# Patient Record
Sex: Female | Born: 1955
Health system: Southern US, Community
[De-identification: ages and names within clinical notes are randomized; demographics above are authoritative.]

## PROBLEM LIST (undated history)

## (undated) ENCOUNTER — Ambulatory Visit: Source: Home / Self Care

## (undated) DIAGNOSIS — R0600 Dyspnea, unspecified: Secondary | ICD-10-CM

## (undated) DIAGNOSIS — D649 Anemia, unspecified: Principal | ICD-10-CM

## (undated) DIAGNOSIS — K429 Umbilical hernia without obstruction or gangrene: Secondary | ICD-10-CM

## (undated) DIAGNOSIS — R7303 Prediabetes: Secondary | ICD-10-CM

## (undated) DIAGNOSIS — K219 Gastro-esophageal reflux disease without esophagitis: Secondary | ICD-10-CM

## (undated) DIAGNOSIS — K8020 Calculus of gallbladder without cholecystitis without obstruction: Secondary | ICD-10-CM

## (undated) DIAGNOSIS — I10 Essential (primary) hypertension: Secondary | ICD-10-CM

## (undated) DIAGNOSIS — B009 Herpesviral infection, unspecified: Secondary | ICD-10-CM

## (undated) DIAGNOSIS — M810 Age-related osteoporosis without current pathological fracture: Secondary | ICD-10-CM

## (undated) DIAGNOSIS — M1990 Unspecified osteoarthritis, unspecified site: Secondary | ICD-10-CM

## (undated) HISTORY — DX: Gastro-esophageal reflux disease without esophagitis: K21.9

## (undated) HISTORY — DX: Age-related osteoporosis without current pathological fracture: M81.0

## (undated) HISTORY — PX: APPENDECTOMY: SHX54

## (undated) HISTORY — DX: Calculus of gallbladder without cholecystitis without obstruction: K80.20

## (undated) HISTORY — DX: Unspecified osteoarthritis, unspecified site: M19.90

## (undated) HISTORY — PX: TUBAL LIGATION: SHX77

## (undated) HISTORY — PX: TONSILLECTOMY: SUR1361

## (undated) HISTORY — DX: Anemia, unspecified: D64.9

---

## 1999-02-01 ENCOUNTER — Encounter: Payer: Self-pay | Admitting: Emergency Medicine

## 1999-02-01 ENCOUNTER — Emergency Department (HOSPITAL_COMMUNITY): Admission: EM | Admit: 1999-02-01 | Discharge: 1999-02-01 | Payer: Self-pay | Admitting: Emergency Medicine

## 1999-05-28 ENCOUNTER — Emergency Department (HOSPITAL_COMMUNITY): Admission: EM | Admit: 1999-05-28 | Discharge: 1999-05-29 | Payer: Self-pay

## 1999-06-14 ENCOUNTER — Emergency Department (HOSPITAL_COMMUNITY): Admission: EM | Admit: 1999-06-14 | Discharge: 1999-06-14 | Payer: Self-pay

## 1999-07-08 ENCOUNTER — Emergency Department (HOSPITAL_COMMUNITY): Admission: EM | Admit: 1999-07-08 | Discharge: 1999-07-08 | Payer: Self-pay | Admitting: Emergency Medicine

## 2000-02-06 ENCOUNTER — Encounter: Payer: Self-pay | Admitting: Emergency Medicine

## 2000-02-06 ENCOUNTER — Emergency Department (HOSPITAL_COMMUNITY): Admission: EM | Admit: 2000-02-06 | Discharge: 2000-02-06 | Payer: Self-pay | Admitting: Emergency Medicine

## 2000-03-30 ENCOUNTER — Emergency Department (HOSPITAL_COMMUNITY): Admission: EM | Admit: 2000-03-30 | Discharge: 2000-03-30 | Payer: Self-pay | Admitting: Emergency Medicine

## 2000-06-06 ENCOUNTER — Emergency Department (HOSPITAL_COMMUNITY): Admission: EM | Admit: 2000-06-06 | Discharge: 2000-06-07 | Payer: Self-pay | Admitting: Emergency Medicine

## 2000-06-19 ENCOUNTER — Emergency Department (HOSPITAL_COMMUNITY): Admission: EM | Admit: 2000-06-19 | Discharge: 2000-06-19 | Payer: Self-pay | Admitting: Internal Medicine

## 2000-06-22 ENCOUNTER — Emergency Department (HOSPITAL_COMMUNITY): Admission: EM | Admit: 2000-06-22 | Discharge: 2000-06-23 | Payer: Self-pay | Admitting: *Deleted

## 2000-07-03 ENCOUNTER — Emergency Department (HOSPITAL_COMMUNITY): Admission: EM | Admit: 2000-07-03 | Discharge: 2000-07-03 | Payer: Self-pay | Admitting: Emergency Medicine

## 2000-07-28 ENCOUNTER — Encounter: Payer: Self-pay | Admitting: Emergency Medicine

## 2000-07-28 ENCOUNTER — Emergency Department (HOSPITAL_COMMUNITY): Admission: EM | Admit: 2000-07-28 | Discharge: 2000-07-28 | Payer: Self-pay | Admitting: Emergency Medicine

## 2000-09-01 ENCOUNTER — Emergency Department (HOSPITAL_COMMUNITY): Admission: EM | Admit: 2000-09-01 | Discharge: 2000-09-02 | Payer: Self-pay | Admitting: Emergency Medicine

## 2000-09-15 ENCOUNTER — Emergency Department (HOSPITAL_COMMUNITY): Admission: EM | Admit: 2000-09-15 | Discharge: 2000-09-15 | Payer: Self-pay | Admitting: *Deleted

## 2000-09-22 ENCOUNTER — Emergency Department (HOSPITAL_COMMUNITY): Admission: EM | Admit: 2000-09-22 | Discharge: 2000-09-22 | Payer: Self-pay | Admitting: Emergency Medicine

## 2000-10-25 ENCOUNTER — Emergency Department (HOSPITAL_COMMUNITY): Admission: EM | Admit: 2000-10-25 | Discharge: 2000-10-25 | Payer: Self-pay | Admitting: Emergency Medicine

## 2000-11-25 ENCOUNTER — Emergency Department (HOSPITAL_COMMUNITY): Admission: EM | Admit: 2000-11-25 | Discharge: 2000-11-26 | Payer: Self-pay | Admitting: Emergency Medicine

## 2000-11-26 ENCOUNTER — Encounter: Payer: Self-pay | Admitting: General Surgery

## 2000-12-18 ENCOUNTER — Encounter: Payer: Self-pay | Admitting: Emergency Medicine

## 2000-12-18 ENCOUNTER — Emergency Department (HOSPITAL_COMMUNITY): Admission: EM | Admit: 2000-12-18 | Discharge: 2000-12-18 | Payer: Self-pay | Admitting: Emergency Medicine

## 2000-12-23 ENCOUNTER — Inpatient Hospital Stay (HOSPITAL_COMMUNITY): Admission: AD | Admit: 2000-12-23 | Discharge: 2000-12-23 | Payer: Self-pay | Admitting: Obstetrics

## 2001-05-12 ENCOUNTER — Emergency Department (HOSPITAL_COMMUNITY): Admission: EM | Admit: 2001-05-12 | Discharge: 2001-05-12 | Payer: Self-pay | Admitting: *Deleted

## 2002-07-05 ENCOUNTER — Emergency Department (HOSPITAL_COMMUNITY): Admission: EM | Admit: 2002-07-05 | Discharge: 2002-07-05 | Payer: Self-pay | Admitting: Emergency Medicine

## 2002-07-05 ENCOUNTER — Encounter: Payer: Self-pay | Admitting: Emergency Medicine

## 2002-08-04 ENCOUNTER — Emergency Department (HOSPITAL_COMMUNITY): Admission: EM | Admit: 2002-08-04 | Discharge: 2002-08-04 | Payer: Self-pay | Admitting: Emergency Medicine

## 2003-02-25 ENCOUNTER — Emergency Department (HOSPITAL_COMMUNITY): Admission: EM | Admit: 2003-02-25 | Discharge: 2003-02-25 | Payer: Self-pay | Admitting: Emergency Medicine

## 2003-08-02 ENCOUNTER — Emergency Department (HOSPITAL_COMMUNITY): Admission: EM | Admit: 2003-08-02 | Discharge: 2003-08-02 | Payer: Self-pay | Admitting: Emergency Medicine

## 2003-11-04 ENCOUNTER — Emergency Department (HOSPITAL_COMMUNITY): Admission: EM | Admit: 2003-11-04 | Discharge: 2003-11-04 | Payer: Self-pay | Admitting: Emergency Medicine

## 2004-01-18 ENCOUNTER — Ambulatory Visit: Payer: Self-pay | Admitting: Family Medicine

## 2004-07-08 ENCOUNTER — Emergency Department (HOSPITAL_COMMUNITY): Admission: EM | Admit: 2004-07-08 | Discharge: 2004-07-08 | Payer: Self-pay | Admitting: Emergency Medicine

## 2004-07-09 ENCOUNTER — Inpatient Hospital Stay (HOSPITAL_COMMUNITY): Admission: AD | Admit: 2004-07-09 | Discharge: 2004-07-09 | Payer: Self-pay | Admitting: *Deleted

## 2004-09-21 ENCOUNTER — Emergency Department (HOSPITAL_COMMUNITY): Admission: EM | Admit: 2004-09-21 | Discharge: 2004-09-21 | Payer: Self-pay | Admitting: Emergency Medicine

## 2004-09-23 ENCOUNTER — Emergency Department (HOSPITAL_COMMUNITY): Admission: EM | Admit: 2004-09-23 | Discharge: 2004-09-23 | Payer: Self-pay | Admitting: Emergency Medicine

## 2004-09-25 ENCOUNTER — Emergency Department (HOSPITAL_COMMUNITY): Admission: EM | Admit: 2004-09-25 | Discharge: 2004-09-25 | Payer: Self-pay | Admitting: Emergency Medicine

## 2004-10-03 ENCOUNTER — Emergency Department (HOSPITAL_COMMUNITY): Admission: EM | Admit: 2004-10-03 | Discharge: 2004-10-03 | Payer: Self-pay | Admitting: Emergency Medicine

## 2004-11-11 ENCOUNTER — Emergency Department (HOSPITAL_COMMUNITY): Admission: EM | Admit: 2004-11-11 | Discharge: 2004-11-11 | Payer: Self-pay | Admitting: Emergency Medicine

## 2005-10-26 ENCOUNTER — Ambulatory Visit: Payer: Self-pay | Admitting: Cardiology

## 2005-10-26 ENCOUNTER — Inpatient Hospital Stay (HOSPITAL_COMMUNITY): Admission: EM | Admit: 2005-10-26 | Discharge: 2005-10-30 | Payer: Self-pay | Admitting: Family Medicine

## 2005-11-01 ENCOUNTER — Emergency Department (HOSPITAL_COMMUNITY): Admission: EM | Admit: 2005-11-01 | Discharge: 2005-11-01 | Payer: Self-pay | Admitting: Emergency Medicine

## 2005-11-02 ENCOUNTER — Inpatient Hospital Stay (HOSPITAL_COMMUNITY): Admission: EM | Admit: 2005-11-02 | Discharge: 2005-11-03 | Payer: Self-pay | Admitting: Emergency Medicine

## 2005-11-11 ENCOUNTER — Ambulatory Visit: Payer: Self-pay | Admitting: Internal Medicine

## 2005-11-17 ENCOUNTER — Emergency Department (HOSPITAL_COMMUNITY): Admission: EM | Admit: 2005-11-17 | Discharge: 2005-11-17 | Payer: Self-pay | Admitting: Emergency Medicine

## 2005-11-18 ENCOUNTER — Encounter: Payer: Self-pay | Admitting: Vascular Surgery

## 2005-11-18 ENCOUNTER — Ambulatory Visit (HOSPITAL_COMMUNITY): Admission: RE | Admit: 2005-11-18 | Discharge: 2005-11-18 | Payer: Self-pay | Admitting: Emergency Medicine

## 2005-11-22 ENCOUNTER — Emergency Department (HOSPITAL_COMMUNITY): Admission: EM | Admit: 2005-11-22 | Discharge: 2005-11-23 | Payer: Self-pay | Admitting: Emergency Medicine

## 2005-11-25 ENCOUNTER — Ambulatory Visit: Payer: Self-pay | Admitting: Cardiology

## 2006-04-10 ENCOUNTER — Emergency Department (HOSPITAL_COMMUNITY): Admission: EM | Admit: 2006-04-10 | Discharge: 2006-04-10 | Payer: Self-pay | Admitting: Family Medicine

## 2006-06-11 DIAGNOSIS — J45909 Unspecified asthma, uncomplicated: Secondary | ICD-10-CM | POA: Insufficient documentation

## 2006-06-11 DIAGNOSIS — M1990 Unspecified osteoarthritis, unspecified site: Secondary | ICD-10-CM | POA: Insufficient documentation

## 2006-06-11 DIAGNOSIS — Z87891 Personal history of nicotine dependence: Secondary | ICD-10-CM | POA: Insufficient documentation

## 2006-06-11 DIAGNOSIS — I10 Essential (primary) hypertension: Secondary | ICD-10-CM | POA: Insufficient documentation

## 2006-07-20 ENCOUNTER — Emergency Department (HOSPITAL_COMMUNITY): Admission: EM | Admit: 2006-07-20 | Discharge: 2006-07-20 | Payer: Self-pay | Admitting: Family Medicine

## 2006-08-21 ENCOUNTER — Emergency Department (HOSPITAL_COMMUNITY): Admission: EM | Admit: 2006-08-21 | Discharge: 2006-08-21 | Payer: Self-pay | Admitting: Emergency Medicine

## 2008-01-28 ENCOUNTER — Emergency Department (HOSPITAL_COMMUNITY): Admission: EM | Admit: 2008-01-28 | Discharge: 2008-01-28 | Payer: Self-pay | Admitting: Emergency Medicine

## 2008-03-30 ENCOUNTER — Emergency Department (HOSPITAL_COMMUNITY): Admission: EM | Admit: 2008-03-30 | Discharge: 2008-03-30 | Payer: Self-pay | Admitting: Family Medicine

## 2008-06-06 ENCOUNTER — Emergency Department (HOSPITAL_COMMUNITY): Admission: EM | Admit: 2008-06-06 | Discharge: 2008-06-06 | Payer: Self-pay | Admitting: Emergency Medicine

## 2008-07-31 ENCOUNTER — Ambulatory Visit (HOSPITAL_COMMUNITY): Admission: RE | Admit: 2008-07-31 | Discharge: 2008-07-31 | Payer: Self-pay | Admitting: Emergency Medicine

## 2008-12-09 ENCOUNTER — Emergency Department (HOSPITAL_COMMUNITY): Admission: EM | Admit: 2008-12-09 | Discharge: 2008-12-09 | Payer: Self-pay | Admitting: Family Medicine

## 2009-02-06 ENCOUNTER — Emergency Department (HOSPITAL_COMMUNITY): Admission: EM | Admit: 2009-02-06 | Discharge: 2009-02-06 | Payer: Self-pay | Admitting: Emergency Medicine

## 2009-03-07 ENCOUNTER — Emergency Department (HOSPITAL_COMMUNITY): Admission: EM | Admit: 2009-03-07 | Discharge: 2009-03-07 | Payer: Self-pay | Admitting: Emergency Medicine

## 2009-07-30 ENCOUNTER — Ambulatory Visit (HOSPITAL_COMMUNITY): Admission: RE | Admit: 2009-07-30 | Discharge: 2009-07-30 | Payer: Self-pay | Admitting: Obstetrics and Gynecology

## 2010-07-17 LAB — DIFFERENTIAL
Basophils Absolute: 0 10*3/uL (ref 0.0–0.1)
Basophils Relative: 0 % (ref 0–1)
Eosinophils Absolute: 0.1 10*3/uL (ref 0.0–0.7)
Eosinophils Relative: 1 % (ref 0–5)
Lymphocytes Relative: 20 % (ref 12–46)
Lymphs Abs: 1.9 10*3/uL (ref 0.7–4.0)
Monocytes Absolute: 0.7 K/uL (ref 0.1–1.0)
Monocytes Relative: 7 % (ref 3–12)
Neutro Abs: 6.8 10*3/uL (ref 1.7–7.7)
Neutrophils Relative %: 72 % (ref 43–77)

## 2010-07-17 LAB — COMPREHENSIVE METABOLIC PANEL WITH GFR
AST: 16 U/L (ref 0–37)
Albumin: 3.1 g/dL — ABNORMAL LOW (ref 3.5–5.2)
BUN: 13 mg/dL (ref 6–23)
CO2: 30 meq/L (ref 19–32)
Calcium: 8.6 mg/dL (ref 8.4–10.5)
Creatinine, Ser: 0.85 mg/dL (ref 0.4–1.2)
GFR calc Af Amer: >60 mL/min (ref >60)
GFR calc non Af Amer: >60 mL/min (ref >60)
Potassium: 3.1 meq/L — ABNORMAL LOW (ref 3.5–5.1)
Total Protein: 7.3 g/dL (ref 6.0–8.3)

## 2010-07-17 LAB — POCT CARDIAC MARKERS
Myoglobin, poc: 62.9 ng/mL (ref 12–200)
Troponin i, poc: <0.05 ng/mL (ref 0.00–0.09)

## 2010-07-17 LAB — URINE MICROSCOPIC-ADD ON

## 2010-07-17 LAB — COMPREHENSIVE METABOLIC PANEL
ALT: 13 U/L (ref 0–35)
Alkaline Phosphatase: 78 U/L (ref 39–117)
Chloride: 101 mEq/L (ref 96–112)
Glucose, Bld: 114 mg/dL — ABNORMAL HIGH (ref 70–99)
Sodium: 137 mEq/L (ref 135–145)
Total Bilirubin: 0.2 mg/dL — ABNORMAL LOW (ref 0.3–1.2)

## 2010-07-17 LAB — URINALYSIS, ROUTINE W REFLEX MICROSCOPIC
Bilirubin Urine: NEGATIVE
Glucose, UA: NEGATIVE mg/dL
Hgb urine dipstick: NEGATIVE
Ketones, ur: NEGATIVE mg/dL
Nitrite: NEGATIVE
Protein, ur: NEGATIVE mg/dL
Specific Gravity, Urine: 1.023 (ref 1.005–1.030)
Urobilinogen, UA: 0.2 mg/dL (ref 0.0–1.0)
pH: 6 (ref 5.0–8.0)

## 2010-07-17 LAB — CBC
HCT: 30.6 % — ABNORMAL LOW (ref 36.0–46.0)
Hemoglobin: 9.3 g/dL — ABNORMAL LOW (ref 12.0–15.0)
MCHC: 30.2 g/dL (ref 30.0–36.0)
MCV: 63.1 fL — ABNORMAL LOW (ref 78.0–100.0)
Platelets: 325 K/uL (ref 150–400)
RBC: 4.86 MIL/uL (ref 3.87–5.11)
RDW: 22.2 % — ABNORMAL HIGH (ref 11.5–15.5)
WBC: 9.4 K/uL (ref 4.0–10.5)

## 2010-08-15 ENCOUNTER — Other Ambulatory Visit (HOSPITAL_COMMUNITY): Payer: Self-pay | Admitting: Internal Medicine

## 2010-08-15 DIAGNOSIS — Z1231 Encounter for screening mammogram for malignant neoplasm of breast: Secondary | ICD-10-CM

## 2010-08-22 ENCOUNTER — Ambulatory Visit (HOSPITAL_COMMUNITY)
Admission: RE | Admit: 2010-08-22 | Discharge: 2010-08-22 | Disposition: A | Discharge status: Home / Self Care | Payer: Self-pay | Source: Ambulatory Visit | Attending: Internal Medicine | Admitting: Internal Medicine

## 2010-08-22 DIAGNOSIS — Z1231 Encounter for screening mammogram for malignant neoplasm of breast: Secondary | ICD-10-CM

## 2010-08-30 NOTE — H&P (Signed)
NAME:  Linda Cabrera, Linda Cabrera NO.:  [id number]   MEDICAL RECORD NO.:  [id number]          PATIENT TYPE:  EMS   LOCATION:  MAJO                         FACILITY:  [facility]   PHYSICIAN:  Lonia Blood, M.D.DATE OF BIRTH:  10/06/1955   DATE OF ADMISSION:  10/25/2005  DATE OF DISCHARGE:                                HISTORY & PHYSICAL   PRIMARY CARE PHYSICIAN:  Unassigned.   CHIEF COMPLAINT:  Chest pain.   HISTORY OF PRESENT ILLNESS:  [name] is a 55 year old female who has  no primary care physician.  She has a history of hypertension and was  treated for medication through [facility] months ago.  She then ran  out of her medications and has not followed up with [facility] since that  time.  Approximately two days ago she began to experience a generalized  severe headache.  She took over-the-counter pain remedies, and they did not  help.  She went to [employer] and used their blood pressure monitor in the  pharmacy and found a systolic pressure greater than 200.  She went to bed.  Upon awakening one day ago her headache continued.  Then she began to  experience chest pain.  She describes this pain specifically as a pressure  in the left substernal region and the left shoulder.  This pressure is  intermittent.  It is worse when she attempts to ambulate.  It does go away  for hours at a time and when it recurs it tends to be when she is  ambulating.  She has not experienced this chest pressure before.  She  specifically describes it as a pressure-type sensation and a heaviness on  my chest.  Nothing has significantly relieved this pain until she [facility]  [facility] where she was given nitroglycerin.  After a total  of three doses she reports that her pain resolved completely.  She was on a  nitroglycerin drip at the time of my evaluation.  As I was examining her I  discontinued her bedside nitro drip.  Within approximately 10 minutes she  was experiencing chest-type pressure again.  Nitroglycerin drip was  therefore resumed.  The patient reports that she otherwise is without acute  complaint.   REVIEW OF SYSTEMS:  The patient is a smoker.  She reports that she has  asthma and intermittent trouble breathing with wheezing which she treats  with an albuterol inhaler.  Her appetite has been preserved.  She does not  know her lipid status.  A comprehensive review of systems has otherwise been  unremarkable.   PAST MEDICAL HISTORY:  1.  Hypertension.  2.  Asthma (bronchitis).  3.  Tobacco abuse in the amount of one half pack per day since teen years.  4.  Morbid obesity.   MEDICATIONS:  None at present with the exception of occasional albuterol MDI  p.r.n.   ALLERGIES:  No known drug allergies.   FAMILY HISTORY:  The patient's father is deceased of unknown causes.  The  patient's mother is deceased from pancreatic cancer, but the  patient reports  that she had coronary artery disease in her 5s.  The patient has one  brother who has coronary artery disease with onset in his 11s.   SOCIAL HISTORY:  The patient lives in [place].  She does not drink.  She  does not use illicit drugs.  She is currently unemployed.   DATA REVIEW:  Hemoglobin is 8.9 with an MCV of 63.  White count and platelet  count are essentially normal.  Sodium, potassium, chloride, bicarb, BUN and  creatinine are normal.  Serum glucose is normal.  The pH is 7.35 with a PCO2  of 48 and no PO2 on venous gas.  BNP is normal.  D-dimer is remarkably  normal at 0.31.  Point-of-care cardiac markers are negative x3.   A 12-lead EKG reveals normal sinus rhythm at 69 beats/min.   PHYSICAL EXAMINATION:  VITAL SIGNS:  Temperature 97.3, blood pressure  162/88, heart rate 93, respiratory rate 16.  O2 sat is 99% on room air.  GENERAL:  A morbidly obese female in no acute respiratory distress.  HEENT:  Normocephalic, atraumatic.  Pupils are equal, round and  reactive to  light and accommodation.  Extraocular muscles intact bilaterally.  OC/OP  clear.  NECK:  Morbidly obese.  No thyromegaly.  LUNGS:  Clear to auscultation bilaterally without wheezes or rhonchi.  CARDIOVASCULAR:  Regular rate and rhythm though distant without murmur,  gallop or rub.  ABDOMEN:  Morbidly obese.  Soft.  Bowel sounds present.  No  hepatosplenomegaly.  No rebound.  No ascites.  EXTREMITIES:  Trace bilateral lower extremity edema without cyanosis,  clubbing or edema.  NEUROLOGIC:  Alert and oriented x4.  Cranial nerves II-XII intact  bilaterally.  Strength 5/5 in bilateral upper and lower extremities.  No  Babinski.   IMPRESSION AND PLAN:  1.  Chest pain.  The patient does in fact describe chest pressure.  Her      description is concerning for unstable angina.  She has an unknown lipid      status.  She is hypertensive and is morbidly obese.  She has a strong      family history.  She is not, however, diabetic.  Her risk factors are      significantly elevated, and her symptoms are significantly consistent      with angina that I feel that inpatient evaluation with rule-out and risk      stratification is clearly indicated.  The patient will be continued on      nitroglycerin drip.  Lovenox will be administered.  Beta-blocker will be      initiated.  After the patient has ruled out for MI a cardiology      consultation will be considered for inpatient risk stratification.  2.  Uncontrolled hypertension.  The patient has severely uncontrolled      hypertension.  For now we will use beta-blocker for management.  If      metoprolol alone is not sufficient we will consider adding HCTZ.  We      will further consider ACE inhibitor as needed.  3.  Tobacco abuse.  The patient has been counseled on the multiple      deleterious effects of ongoing tobacco abuse.  She has been counseled to     discontinue tobacco abuse immediately.  Tobacco cessation consultation       will be requested to encourage this during the patient's hospital stay.  4.  Normocytic anemia.  The patient has  a significant normocytic anemia with      a hemoglobin of 8.9 and an MCV of 63.  This is likely simply secondary      to menstruation.  We will check an anemia panel and guaiac stools as      well.  5.  Patient with morbid obesity.  The patient will be counseled during this      hospital stay as to the advisability of significant weight loss.  She      will be counseled on appropriate plans to do so.      Lonia Blood, M.D.  Electronically Signed     JTM/MEDQ  D:  10/26/2005  T:  10/26/2005  Job:  [id number]

## 2010-08-30 NOTE — Discharge Summary (Signed)
NAME:  ZALEY, TALLEY                 ACCOUNT NO.:  [id number]   MEDICAL RECORD NO.:  [id number]          PATIENT TYPE:  INP   LOCATION:  3713                         FACILITY:  [facility]   PHYSICIAN:  Lonia Blood, M.D.      DATE OF BIRTH:  December 11, 1955   DATE OF ADMISSION:  10/26/2005  DATE OF DISCHARGE:  10/30/2005                                 DISCHARGE SUMMARY   PRIMARY CARE PHYSICIAN:  Lexicographer, unassigned.   DISCHARGE DIAGNOSES:  1.  Atypical chest pain with MI ruled out.  2.  Uncontrolled hypertension.  3.  Tobacco abuse.  4.  Iron-deficiency anemia.  5.  Morbid obesity.  6.  Asthma bronchitis.   DISCHARGE MEDICATIONS:  1.  Clonidine 0.1 mg p.o. t.i.d.  2.  Nu-Iron 150 mg daily.  3.  Hydrochlorothiazide 25 mg daily.  4.  Lopressor 75 mg b.i.d.   DISPOSITION:  The patient is completely chest pain-free.  She is being  discharged to follow up with [facility].  Her workup is essentially negative  for ischemia with Cardiolite at least in the preliminary stage.  She seems  to have had more than likely hypertensive urgency situation which has now  resolved.  She is currently going to continue taking three different  medications for her high blood pressure.   PROCEDURES PERFORMED:  1.  Chest x-ray performed on October 25, 2005 showed no acute cardiopulmonary      process.  2.  Cardiolite performed on October 28, 2005.  Preliminary results negative for      ischemia.   CONSULTATIONS:  Dr. Rollene Rotunda, [facility].   HISTORY AND PHYSICAL:  Please refer to dictated history and physical by Dr.  Jetty Duhamel.  In short, however, the patient is a 55 year old female who  apparently has a history of hypertension and asthma.  She came in to the ER  secondary to having left-sided chest pain that has been going on for about  two days and not able to control it by over-the-counter medications.  In the  ED her blood pressure was found to be 162/88.  The patient said her  systolic  has been in the 200s in the preceding days.  Her heart rate was 93.  She was  otherwise stable.  She has risk factors for coronary artery disease  including hypertension, morbid obesity, and tobacco abuse.  Hence, she was  admitted for further workup.  Her EKG was only sinus rhythm at that time  with no ST-T wave changes.   HOSPITAL COURSE:  1.  Chest pain.  This was thought to be atypical but due to her risk      factors, cardiac enzymes were checked serially and were negative.  She      was started on Nitro drip both for her blood pressure and the chest pain      which seems to have relieved her chest pain.  She was also on beta      blockers and morphine.  The patient responded to this treatment and got      better.  Due to a risk factor also, cardiology consult was requested and      patient had a Cardiolite done that preliminary results show that it is      negative for ischemia.  She is to continue current medications to      control her blood pressure and follow up as an outpatient with her      primary care physician at [facility].  2.  Uncontrolled hypertension.  The patient's hypertension was severe and it      took multiple medications to get it under control.  Currently systolic      is less than 120 and the patient has been advised to continue checking      that.  Also weight loss as well as diet modification.  3.  Tobacco abuse.  The patient received tobacco cessation counseling.  In      fact, nicotine patch was also offered while in the hospital but the      patient did not require that.  She is contemplating quitting but has not      quit.  4.  Iron-deficiency anemia.  The patient's hemoglobin on admission was 8.9.      Workup included checking anemia panel that came back with total iron of      only 13, iron sats of 4, ferritin only 2, indicating that the patient      has clearly iron-deficiency anemia.  At the age of 65, she will need      some GI workup.   Her primary care physician is advised to schedule the      patient for a colonoscopy.  She had no obvious bleeding and no occult      bleeding.  5.  Asthma/bronchitis.  The patient had a bout of asthma attack while      undergoing Cardiolite.  Otherwise she has been stable with no attacks.   LABORATORY DATA:  Other significant labs include her fasting lipid panel  that showed total cholesterol of 132, triglycerides 36, HDL 52, and LDL 73.      Lonia Blood, M.D.  Electronically Signed     LG/MEDQ  D:  10/30/2005  T:  10/30/2005  Job:  [id number]

## 2010-08-30 NOTE — Cardiovascular Report (Signed)
NAME:  Linda Cabrera, Linda Cabrera                 ACCOUNT NO.:  [id number]   MEDICAL RECORD NO.:  [id number]          PATIENT TYPE:  INP   LOCATION:  3713                         FACILITY:  [facility]   PHYSICIAN:  Salvadore Farber, M.D. LHCDATE OF BIRTH:  1955/06/24   DATE OF PROCEDURE:  10/30/2005  DATE OF DISCHARGE:                              CARDIAC CATHETERIZATION   DATE OF PROCEDURE:  10/30/2005.   PROCEDURE:  Left heart catheterization, left ventriculography, coronary  angiography.   INDICATIONS:  Ms. Preslar is a 55 year old lady with morbid obesity and  hypertension as well as ongoing tobacco abuse.  She presents with an episode  of fairly atypical chest discomfort in the setting of severely elevated  blood pressure.  Her morbid obesity would decrease the sensitivity and  specificity of any noninvasive test.  She was therefore referred for  diagnostic angiography for definitive exclusion of coronary disease as the  etiology of her chest discomfort.   PROCEDURAL TECHNIQUE:  Informed consent was obtained.  Under 1% lidocaine  and local anesthesia, a 5-French sheath was placed in the right common  femoral artery using the modified Seldinger technique.  Diagnostic  angiography and ventriculography were performed using JL-4, JR-4, and  pigtail catheters.  The arteriotomy was then closed using an Angio-Seal  device.  Complete hemostasis was obtained.  She was then transferred to the  holding room in stable condition having tolerated the procedure well.   COMPLICATIONS:  None.   FINDINGS:  1. LV:  158/24/39.  EF of 55% with mild mid anterior hypokinesis.  It is      not clear that this is outside the limits of normal.  2. No aortic stenosis or mitral regurgitation.  3. Left main:  Angiographically normal.  4. LAD:  Moderate-sized vessel giving rise to no sizable diagonals.  It is      angiographically normal.  5. Ramus intermedius:  Very large bifurcating vessel.  It is      angiographically  normal.  6. Circumflex:  Relatively small vessel giving rise to two marginals.  It      is angiographically normal.  7. RCA:  Moderate-sized dominant vessel.  It is angiographically normal.   IMPRESSION/PLAN:  The patient has angiographically normal coronary arteries.  Her chest pain may be due to her systemic hypertension as well as elevated  left ventricular end-diastolic pressure.  We will work aggressively on blood  pressure control.  Smoking cessation was also strongly advised.  Will need  to consider sleep apnea as a contributor to her morbidity as well.     Salvadore Farber, M.D. [facility]  Electronically Signed    WED/MEDQ  D:  10/30/2005  T:  10/30/2005  Job:  [id number]

## 2010-08-30 NOTE — Discharge Summary (Signed)
NAME:  Linda Cabrera, BITTEL                 ACCOUNT NO.:  [id number]   MEDICAL RECORD NO.:  [id number]          PATIENT TYPE:  INP   LOCATION:  5705                         FACILITY:  [facility]   PHYSICIAN:  Hillery Aldo, M.D.   DATE OF BIRTH:  1955-12-11   DATE OF ADMISSION:  11/02/2005  DATE OF DISCHARGE:  11/03/2005                                 DISCHARGE SUMMARY   PRIMARY CARE PHYSICIAN:  [facility]   CHIEF COMPLAINT:  Angioedema.   DISCHARGE DIAGNOSES:  1.  Angioedema related to ACE inhibitor use.  2.  Hypertension.  3.  Iron deficiency anemia.  4.  Asthma.  5.  Ongoing tobacco abuse.  6.  Obesity.  7.  Gastroesophageal reflux disease.  8.  Recent hospitalization for atypical chest pain with a negative      Cardiolite and cardiac catheterization.   DISCHARGE MEDICATIONS:  1.  Norvasc 5 mg daily.  2.  Hydrochlorothiazide 25 mg daily.  3.  Clonidine 0.1 mg t.i.d.  4.  Nu-Iron 150 mg daily.  5.  Prednisone taper 60 mg daily to off over six days.  6.  Albuterol two puffs q.4h. p.r.n.  7.  Benadryl 25 mg q.6h. p.r.n.   CONSULTATIONS:  None.   BRIEF ADMISSION HPI:  Patient is a 55 year old female who presented with  sudden onset of angioedema 24 hours after starting lisinopril.  She was  admitted for further evaluation and monitoring of her airway.   PROCEDURES AND DIAGNOSTIC STUDIES:  Chest x-ray on November 02, 2005 showed no  active cardiopulmonary disease.   DISCHARGE LABORATORY VALUES:  Sodium was 139, potassium 3.7, chloride 103,  bicarbonate 28, BUN 9, creatinine 0.8, glucose 129.  White blood cell count  was 9.6, hemoglobin 9.6, hematocrit 31.8, platelet count 325 with an MCV of  64.1.   HOSPITAL COURSE:  #1 - ANGIOEDEMA:  Patient was put on Solu-Medrol at a dose  of 40 mg IV daily and received two doses of this.  Within 24 hours there was  a marked reduction in the swelling about her face.  She was discharged on a  prednisone taper over six days.  She was also put on  Benadryl 25 mg q.i.d.  and will be discharged on this on an as-needed basis.  She was instructed to  avoid any medications ending in pril.  She is deemed stable for discharge  and was instructed to return to the ED for any further problems with  swelling   #2 - HYPERTENSION:  Patient was hypertensive with systolic pressures in 160s  once the lisinopril was discontinued.  She was started on Norvasc prior to  discharge and will continue on this medication until she follows up with her  primary care physician at [facility].   #3 - STEROID-INDUCED HYPERGLYCEMIA:  The patient did have elevated blood  glucoses while on Solu-Medrol.  The highest was 249.  She will not be  discharged on any oral hypoglycemic medications given that she will undergo  a rapid steroid taper.  She should be routinely checked for evidence of  impaired fasting glucose for  diabetes as this likely indicates an underlying  predisposition.  She was managed with sliding scale insulin while  hospitalized.   #4 - ASTHMA:  The patient's asthma remained stable through the course of her  hospitalization.   #5 - IRON DEFICIENCY ANEMIA:  The patient was continued on iron supplements.   DISPOSITION:  The patient is stable for discharge.  She instructed to follow  up with her primary care physician at [facility] next week.      Hillery Aldo, M.D.  Electronically Signed     CR/MEDQ  D:  11/03/2005  T:  11/03/2005  Job:  [id number]

## 2010-08-30 NOTE — Consult Note (Signed)
NAME:  [name], [name]                           ACCOUNT NO.:  [id number]   MEDICAL RECORD NO.:  [id number]                   PATIENT TYPE:  EMS   LOCATION:  ED                                   FACILITY:  [facility]   PHYSICIAN:  Corinna L. Lendell Caprice, MD             DATE OF BIRTH:  04-22-1955   DATE OF CONSULTATION:  02/25/2003  DATE OF DISCHARGE:                                   CONSULTATION   REASON FOR CONSULTATION:  Asthma exacerbation, evaluate for admission.   CONSULTING PHYSICIAN:  Dr. Ethelda Chick.   IMPRESSIONS/RECOMMENDATIONS:  1. Acute bronchitis with exacerbation of asthma.  Patient currently has good     oxygen saturations even with ambulation and is not truly wheezing     currently.  She does have voluntary upper respiratory sounds, which stop     when she is talking or distracted.  I feel patient is safe to be     discharged to home with outpatient treatment and follow up with     [facility].  I have also recommended that she quit smoking.  2. Tobacco abuse, see above.  3. Obesity.   HISTORY OF PRESENT ILLNESS:  [name] is a 55 year old black female with a  history of asthma who ran out of her inhaler and presents with a few-day  history of worsening shortness of breath.  She has had a cough productive of  clear to yellow sputum.  She denies fevers or chills.  She has been short of  breath.  She continues to smoke a half a pack of cigarettes a day.   PAST MEDICAL HISTORY:  As above.   MEDICATIONS:  None.   ALLERGIES:  None.   SOCIAL HISTORY:  She is unemployed.  She smokes a half a pack of cigarettes  a day.  She denies drinking or drugs.   FAMILY HISTORY:  Noncontributory.   REVIEW OF SYSTEMS:  CONSTITUTIONAL:  As above.  HEENT:  She has had a lot of  sinus congestion.  RESPIRATORY:  As above.  CARDIOVASCULAR:  No chest pains.  GI:  No nausea, vomiting or diarrhea.  GU:  No dysuria.  SKIN:  No rash.   PHYSICAL EXAMINATION:  Her oxygen saturation is 97% on  room air, with  ambulation it remains in the 90th percentile range.  Her other vital signs  are per nursing records.  GENERAL:  Patient is an obese black female who when I enter the room had no  audible breath sounds but when I entered the room she immediately began  having upper airway sounds that could be heard across the room without a  stethoscope.  These, however, would stop when she was distracted or when she  was talking.  HEENT:  Normocephalic, atraumatic.  Pupils equal, round, reactive to light.  Her oropharynx is clear.  Moist mucous membranes.  No oropharyngeal exudate.  NECK:  Thick.  LUNGS:  She has upper airway breath sounds that are voluntary and stop when  she is distracted.  She is not currently truly wheezing at this time.  CARDIOVASCULAR:  Regular rate and rhythm without murmurs, gallops or rubs.  ABDOMEN:  Obese, soft, nontender.  EXTREMITIES:  No cyanosis, clubbing or edema.  NEUROLOGIC:  Patient is alert and oriented, cooperative, and pleasant.  Cranial nerves and sensory motor exam are intact.  SKIN:  No rash.   Chest x-ray shows no infiltrate.   I have written discharge orders and written a prescription for doxycycline  100 mg p.o. b.i.d. for seven days and also a short prednisone taper.  She  has received several hand-held nebulizers here as well as magnesium and p.o.  prednisone.  I will also give her a prescription for albuterol MDI which I  am told we may be able to get for her.                                               Corinna L. Lendell Caprice, MD    CLS/MEDQ  D:  02/25/2003  T:  02/25/2003  Job:  [id number]

## 2010-08-30 NOTE — Assessment & Plan Note (Signed)
[facility]                                   ON-CALL NOTE   SHELETHA, BOW                          MRN:          [id number]  DATE:10/30/2005                            DOB:          07-02-1955    HOSPITAL SUMMARY NOTE:   SUMMARY:  Ms. Huante is a very pleasant 55 year old female patient with no  previously known history of coronary disease, who Dr. Antoine Poche saw in  consultation on October 27, 2005 for atypical chest pain.  She was ruled out  for myocardial infarction by enzymes.  She was set up for an inpatient  Myoview study.  This did return showing some mild inferior ischemia and  normal LV function.  It was decided to proceed with cardiac catheterization  to further define her anatomy.  This was done by Dr. Samule Ohm on October 30, 2005  and shows normal coronary arteries, EF 55% with mild mid anterior  hypokinesis.  Her left ventricular end-diastolic pressure was elevated as  well as her blood pressure.  It was recommended that she undergo aggressive  treatment of her hypertension.  Smoking cessation was also advised.  Dr.  Samule Ohm stopped her Lopressor and started her on lisinopril/HCT.  Her right  femoral artery site was closed with an AngioSeal.  At followup on the date  of her discharge, her right femoral artery site was stable.  There was no  serosanguineous drainage on her dressing and there were no bruits  auscultated and her site was soft.  The patient has been set up with  followup with the physician assistant/nurse practitioner on November 10, 2005 at  11 a.m.  She will need a BMET at that time to follow up on her renal  function and potassium, given the initiation of her ACE inhibitor.  She has  no primary care physician and her attending physician in the hospital, Dr.  Mikeal Hawthorne, recommended she follow up with [facility].                                   Tereso Newcomer, PA-C   SW/MedQ  DD:  10/30/2005  DT:  10/31/2005  Job #:  [id number]

## 2010-08-30 NOTE — Consult Note (Signed)
NAME:  Linda Cabrera, Linda Cabrera                 ACCOUNT NO.:  [id number]   MEDICAL RECORD NO.:  [id number]          PATIENT TYPE:  INP   LOCATION:  3713                         FACILITY:  [facility]   PHYSICIAN:  Rollene Rotunda, M.D.   DATE OF BIRTH:  08/20/1955   DATE OF CONSULTATION:  10/27/2005  DATE OF DISCHARGE:                                   CONSULTATION   PRIMARY CARE PHYSICIAN:  None.   CARDIOLOGIST:  None.  She is new to Rollene Rotunda, M.D.   REASON FOR REFERRAL:  Chest pain.   HISTORY OF PRESENT ILLNESS:  Linda Cabrera is a very pleasant 55 year old African-  American female with a unknown history of coronary disease who presented to  the [facility] emergency room on October 26, 2005, with complaints of  chest pain.  She had run out of her blood pressure medicines some time ago  and she developed a headache on Saturday October 25, 2005.  She took her blood  pressure at a drug store and it was in the 200s/100s.  She eventually  decided to come to the emergency room.  She began to note chest pain some  time on the day of October 25, 2005.  In the emergency room she apparently had  relief with nitroglycerin sublingually.  She was placed on a nitroglycerin  drip.  She notes that her chest pain is a left-sided pressure sensation that  radiates to her left shoulder and her neck.  She denies any associated  shortness of breath, nausea, diaphoresis. She is ruled out for myocardial  infarction by enzymes thus far.  Her nitroglycerin drip was discontinued  some time yesterday but her chest pain returned and her nitroglycerin drip  was turned back on.  She notes a history of chest pain for years now.  She  also notes a history of dyspnea on exertion  for years.  She cannot remember  a time when she was able to get a flight of steps without shortness of  breath.  She will occasionally note this same chest pain with rest as well  as with exertion.   PAST MEDICAL HISTORY:  Notable for hypertension,  asthma, morbid obesity and  bilateral knee degenerative joint disease.  She denies any diabetes  mellitus, hypercholesterolemia or thyroid disease.   CURRENT MEDICATIONS:  1.  Lovenox 1 mg/kg subcutaneously twice a day.  2.  Lopressor 50 mg b.i.d.  3.  Ambien p.r.n.  4.  Albuterol p.r.n.  5.  Nitroglycerin drip.   ALLERGIES:  NO KNOWN DRUG ALLERGIES.   SOCIAL HISTORY:  The patient lives in [place] with her husband.  She is  not employed.  She has 1 child.  She smokes 1/2 to 1 pack of cigarettes and  has done so for 32 years.  She drinks alcohol on occasion.  Denies any drug  abuse.   FAMILY HISTORY:  Mother is deceased from pancreatic cancer.  She notes that  she had a bad heart.  Her father's history is unknown.  She has a brother  with coronary disease.   REVIEW  OF SYSTEMS:  Please see HPI.  She denies any fevers, chills, melena,  hematochezia, hematuria, dysuria. She does note that she sleeps on 2  pillows.  She will occasionally awaken at night short of breath which seems  to be consistent with paroxysmal nocturnal dyspnea.  She also lower  extremity edema as well as a dry cough.  There is no hemoptysis.  She denies  any dysphagia or odynophagia but does note quite a bit of belching and water  brash symptoms and dyspepsia.  She notes that her husband has told her in  the past that she snores.  No apneic episodes have been noted.  She does  note a daytime hypersomnolence.  The rest of review of systems are negative.   PHYSICAL EXAMINATION:  VITAL SIGNS:  She is a well-nourished, well developed  obese female in no acute distress.  Blood pressure is 98/62, pulse 67, respirations 20, temperature 98.2.  HEENT:  Head is normocephalic, atraumatic.  Eyes:  PERRLA.  Sclerae clear.  NECK:  Supple without lymphadenopathy.  Carotids are 2+ bilaterally without  bruits.  No JVD noted.  ENDOCRINE:  Without thyromegaly.  CARDIAC:  Normal S1 and S2.  Regular rate and rhythm without  murmurs.  LUNGS:  Clear to auscultation bilaterally without wheezing, rales or  rhonchi.  Decreased breath sounds bilaterally.  SKIN:  Without rashes.  ABDOMEN:  Soft, nontender, normoactive bowel sounds.  No rebound or  guarding.  No hepatomegaly.  EXTREMITIES:  Without clubbing or cyanosis.  There is trace 1+ edema  bilaterally.  Calves are soft, nontender without cords.  VASCULAR:  She has 2+ dorsalis pedis and posterior tibialis pulses  bilaterally.  MUSCULOSKELETAL:  Without spine or CVA tenderness.  NEUROLOGIC:  She is alert and oriented times 3.  Cranial nerves II-XII are  grossly intact. Strength is 5/5 in all extremities and axial groups.   X-RAYS/LAB DATA:  Chest x-ray no acute disease.  EKG sinus bradycardia with  a heart rate of 58.  Nonspecific changes.  Hemoglobin 11.9 on admission, 9.1  today, MCV 63.2, iron 13, ferritin 2.  White count 10,600, platelet count  283,000.  Sodium 140, potassium 3.8, BUN 11, creatinine 0.7, glucose 102,  total bilirubin 0.3, AST 13, ALT 12, alkaline phosphatase 79, total  cholesterol 132, triglycerides 36, HDL 52, LDL 73, BNP 35.  Total protein 6,  albumin 2.8, D-dimer 3.1, INR 1.0, calcium 8.3, magnesium 2.3.   IMPRESSION:  1.  Atypical chest pain.  2.  Hypertension.  3.  Microcytic anemia.  4.  Morbid obesity.  5.  Rule out CPAP.  6.  Tobacco abuse.  7.  Asthma.  8.  Probable gastroesophageal reflux disease.   PLAN:  The patient was also interviewed with Dr. Antoine Poche.  The patient  presents with chest pain.  She has had chest pain on and off for over a year  and it was more intensive yesterday.  She has atypical chest pain.  There is  no objective evidence of ischemia.  Her pretest probability of coronary  artery disease is low.  Plan on stress perfusion study with an adenosine  Myoview tomorrow.  Due to her weight she will need to be a 2-day study.  If her Myoview shows no ischemia and normal LV function, no further cardiac   workup really necessary.  Her microcytic anemia will  need further workup and this will be per her primary Linda Cabrera.  Also would  suggest she be set up for  an outpatient sleep study to rule out sleep apnea.  This will also be done per her primary care service.  We will add a proton  pump inhibitor to cover her for gastroesophageal reflux disease.      Tereso Newcomer, P.A.    ______________________________  Rollene Rotunda, M.D.    SW/MEDQ  D:  10/27/2005  T:  10/28/2005  Job:  [id number]
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
## 2010-08-30 NOTE — H&P (Signed)
NAME:  Linda Cabrera, WHITMIRE                 ACCOUNT NO.:  [id number]   MEDICAL RECORD NO.:  [id number]          PATIENT TYPE:  INP   LOCATION:  5705                         FACILITY:  [facility]   PHYSICIAN:  Hillery Aldo, M.D.   DATE OF BIRTH:  [birth year]   DATE OF ADMISSION:  11/02/2005  DATE OF DISCHARGE:                                HISTORY & PHYSICAL   PRIMARY CARE PHYSICIAN:  [facility].   CHIEF COMPLAINT:  Angioedema.   HISTORY OF PRESENT ILLNESS:  Patient is a 55 year old female who presents  with a sudden onset of swelling about the face within 24 hours of starting a  new blood pressure combination medicine, HCTZ/lisinopril.  The patient  denies any current shortness of breath or cough.  She presented to the  emergency department for further evaluation and is being admitted for  monitoring of her airway.   PAST MEDICAL HISTORY:  1.  Hypertension.  2.  Iron-deficiency anemia.  3.  Asthmatic bronchitis.  4.  Recent hospitalization for atypical chest pain with a negative      Cardiolite and heart catheterization.  5.  Ongoing tobacco abuse.  6.  Morbid obesity.  7.  Gastroesophageal reflux disease.   FAMILY HISTORY:  Patient's mother died of pancreatic cancer.  She also had  coronary disease.  Father's medical history is unknown.  Brother has  coronary disease.   SOCIAL HISTORY:  Patient is married.  Lives here in [place].  She  continues to smoke about a half pack of tobacco daily.  Denies any alcohol  or drug use.  She is currently unemployed.   DRUG ALLERGIES:  A new allergy to ACE INHIBITOR MEDICATIONS.   MEDICATIONS:  1.  Clonidine 0.1 t.i.d.  2.  Lisinopril/HCTZ 10/12.5 daily.  3.  Poly Iron 150 mg daily.   REVIEW OF SYSTEMS:  Patient has been febrile.  She has a decreased appetite.  Mild nausea but no vomiting.  No diarrhea.  No shortness of breath.  No  cough.  She has a recent episode of chest pain for which she was  hospitalized and ruled out for acute MI  with Cardiolite and heart  catheterization.  No dysuria.   PHYSICAL EXAMINATION:  VITAL SIGNS:  Temperature 101, pulse 70, respirations  18, blood pressure 123/85.  O2 saturation 97% on room air.  GENERAL:  This is an obese female who is in no acute distress.  HEENT:  Normocephalic and atraumatic.  PERRL.  EOMI.  Oropharynx reveals  some upper airway blockages related to her body habitus and angioedema.  She  does have significant swelling about the upper lip and lower lip.  NECK:  Supple.  No thyromegaly.  No lymphadenopathy.  No jugular venous  distention.  CHEST:  There is a faint occasional wheeze with decreased breath sounds.  HEART:  Regular rate and rhythm.  No murmurs, rubs or gallops.  ABDOMEN:  Soft, nontender, nondistended with normoactive bowel sounds.  SKIN:  Warm and dry.  No rashes.  NEUROLOGIC:  Patient is alert and oriented x3.  Nonfocal.   DATA REVIEW:  Laboratory data reveals a sodium of 138, potassium 4.1,  chloride 105, bicarb 28, BUN 11, creatinine 0.8, glucose 95.  D-dimer is  0.34.  Point-of-care markers are negative x1.   ASSESSMENT/PLAN:  1.  ACE inhibitor-induced angioedema:  Patient will be admitted to the      hospital for monitoring of her airway.  This is usually self-limited.      We will start Benadryl and Solu-Medrol at 40 mg with a prednisone taper.      Patient was instructed about the importance of avoiding any further      treatment with ACE inhibitor medications.  2.  Gastroesophageal reflux disease:  Will start the patient on a proton      pump inhibitor.  3.  Hypertension:  Will monitor the patient's blood pressure.  She may need      the addition of another agent, given that we are discontinuing her      lisinopril.  Right now, we will keep her on clonidine and      hydrochlorothiazide and add Norvasc if needed for blood pressure      control.  4.  Obesity:  Patient likely is insulin resistant.  Given this, we will put      her on some  sliding-scale insulin, given that we are treating her with      steroids for her angioedema.  5.  Tobacco abuse:  Received counseling on cessation.  6.  Asthma:  Will initiate bronchodilator treatment q.i.d. and p.r.n.  7.  Fever:  Unclear etiology.  May be related to the underlying angioedema.      Nevertheless, I will check a CBC with differential and chest x-ray as      well as urinalysis to look for possible source of infection.  8.  Prophylaxis:  Initiate deep venous thrombosis prophylaxis with Lovenox      and gastrointestinal prophylaxis with Protonix.      Hillery Aldo, M.D.  Electronically Signed     CR/MEDQ  D:  11/02/2005  T:  11/02/2005  Job:  [id number]

## 2011-01-13 LAB — COMPREHENSIVE METABOLIC PANEL
ALT: 10
AST: 18
Alkaline Phosphatase: 77
GFR calc Af Amer: >60
Glucose, Bld: 83

## 2011-01-13 LAB — CBC
HCT: 31.2 — ABNORMAL LOW
Hemoglobin: 9.3 — ABNORMAL LOW
MCHC: 29.8 — ABNORMAL LOW
MCV: 63.9 — ABNORMAL LOW
Platelets: 309

## 2011-01-13 LAB — TYPE AND SCREEN: Antibody Screen: NEGATIVE

## 2011-01-17 LAB — POCT I-STAT, CHEM 8
Calcium, Ion: 1.13 mmol/L (ref 1.12–1.32)
Chloride: 104 mEq/L (ref 96–112)
HCT: 35 % — ABNORMAL LOW (ref 36.0–46.0)
Potassium: 3.7 mEq/L (ref 3.5–5.1)
Sodium: 139 mEq/L (ref 135–145)

## 2011-04-24 ENCOUNTER — Encounter (HOSPITAL_COMMUNITY): Payer: Self-pay | Admitting: Emergency Medicine

## 2011-04-24 ENCOUNTER — Emergency Department (INDEPENDENT_AMBULATORY_CARE_PROVIDER_SITE_OTHER)
Admission: EM | Admit: 2011-04-24 | Discharge: 2011-04-24 | Disposition: A | Discharge status: Home / Self Care | Payer: Self-pay | Source: Home / Self Care | Attending: Family Medicine | Admitting: Family Medicine

## 2011-04-24 DIAGNOSIS — T148XXA Other injury of unspecified body region, initial encounter: Secondary | ICD-10-CM

## 2011-04-24 HISTORY — DX: Essential (primary) hypertension: I10

## 2011-04-24 MED ORDER — CYCLOBENZAPRINE HCL 5 MG PO TABS: 5.0000 mg | ORAL_TABLET | Freq: Three times a day (TID) | ORAL | Status: AC | PRN

## 2011-04-24 MED ORDER — DICLOFENAC POTASSIUM 50 MG PO TABS: 50.0000 mg | ORAL_TABLET | Freq: Three times a day (TID) | ORAL | Status: DC

## 2011-04-24 NOTE — ED Provider Notes (Signed)
History     CSN: [id number]  Arrival date & time 04/24/11  1191   First MD Initiated Contact with Patient 04/24/11 1941      Chief Complaint  Patient presents with  . Back Pain    (Consider location/radiation/quality/duration/timing/severity/associated sxs/prior treatment) Patient is a 56 y.o. female presenting with back pain. The history is provided by the patient.  Back Pain  The current episode started yesterday (reports having bronchitis for prev 2 weeks). The problem has not changed since onset.The pain is associated with no known injury. The pain is present in the thoracic spine. The quality of the pain is described as stabbing. The pain does not radiate. The pain is mild. The symptoms are aggravated by twisting (reaching and palpation.). Pertinent negatives include no fever, no numbness, no abdominal pain and no abdominal swelling.    Past Medical History  Diagnosis Date  . Asthma   . Hypertension     History reviewed. No pertinent past surgical history.  History reviewed. No pertinent family history.  History  Substance Use Topics  . Smoking status: Not on file  . Smokeless tobacco: Not on file  . Alcohol Use:     OB History    Grav Para Term Preterm Abortions TAB SAB Ect Mult Living                  Review of Systems  Constitutional: Negative for fever.  HENT: Negative.   Respiratory: Negative for cough, shortness of breath and wheezing.   Gastrointestinal: Negative.  Negative for abdominal pain.  Musculoskeletal: Positive for back pain.  Neurological: Negative for numbness.    Allergies  Ace inhibitors  Home Medications   Current Outpatient Rx  Name Route Sig Dispense Refill  . HYDROCHLOROTHIAZIDE 25 MG PO TABS Oral Take 25 mg by mouth daily.    Marland Kitchen METOPROLOL TARTRATE 25 MG PO TABS Oral Take 25 mg by mouth 2 (two) times daily.    . CYCLOBENZAPRINE HCL 5 MG PO TABS Oral Take 1 tablet (5 mg total) by mouth 3 (three) times daily as needed for muscle  spasms. 30 tablet 0  . DICLOFENAC POTASSIUM 50 MG PO TABS Oral Take 1 tablet (50 mg total) by mouth 3 (three) times daily. For back pain 30 tablet 0    BP 138/75  Pulse 66  Temp(Src) 99.1 F (37.3 C) (Oral)  Resp 20  SpO2 100%  LMP 08/07/2010  Physical Exam  Nursing note and vitals reviewed. Constitutional: She is oriented to person, place, and time. She appears well-developed and well-nourished.  HENT:  Head: Normocephalic.  Right Ear: External ear normal.  Left Ear: External ear normal.  Mouth/Throat: Oropharynx is clear and moist.  Neck: Normal range of motion. Neck supple.  Pulmonary/Chest: Effort normal and breath sounds normal. She has no wheezes. She has no rales.  Musculoskeletal:       Arms: Lymphadenopathy:    She has no cervical adenopathy.  Neurological: She is alert and oriented to person, place, and time.  Skin: Skin is warm and dry.    ED Course  Procedures (including critical care time)  Labs Reviewed - No data to display No results found.   1. Musculoskeletal strain       MDM          Barkley Bruns, MD 04/24/11 2104

## 2011-04-24 NOTE — ED Notes (Signed)
C/o pain mostly in back under shoulder bone since yesterday. No other symptoms.

## 2011-04-25 ENCOUNTER — Emergency Department (HOSPITAL_COMMUNITY)
Admission: EM | Admit: 2011-04-25 | Discharge: 2011-04-25 | Disposition: A | Discharge status: Home / Self Care | Payer: Self-pay | Attending: Emergency Medicine | Admitting: Emergency Medicine

## 2011-04-25 ENCOUNTER — Emergency Department (HOSPITAL_COMMUNITY): Payer: Self-pay

## 2011-04-25 ENCOUNTER — Encounter (HOSPITAL_COMMUNITY): Payer: Self-pay

## 2011-04-25 DIAGNOSIS — IMO0002 Reserved for concepts with insufficient information to code with codable children: Secondary | ICD-10-CM | POA: Insufficient documentation

## 2011-04-25 DIAGNOSIS — J45909 Unspecified asthma, uncomplicated: Secondary | ICD-10-CM | POA: Insufficient documentation

## 2011-04-25 DIAGNOSIS — I10 Essential (primary) hypertension: Secondary | ICD-10-CM | POA: Insufficient documentation

## 2011-04-25 DIAGNOSIS — X500XXA Overexertion from strenuous movement or load, initial encounter: Secondary | ICD-10-CM | POA: Insufficient documentation

## 2011-04-25 DIAGNOSIS — T148XXA Other injury of unspecified body region, initial encounter: Secondary | ICD-10-CM

## 2011-04-25 MED ORDER — DIAZEPAM 5 MG PO TABS
5.0000 mg | ORAL_TABLET | Freq: Once | ORAL | Status: AC
  Administered 2011-04-25: 5 mg via ORAL
  Filled 2011-04-25: qty 1

## 2011-04-25 MED ORDER — KETOROLAC TROMETHAMINE 60 MG/2ML IM SOLN
60.0000 mg | Freq: Once | INTRAMUSCULAR | Status: AC
  Administered 2011-04-25: 60 mg via INTRAMUSCULAR
  Filled 2011-04-25: qty 2

## 2011-04-25 NOTE — ED Notes (Signed)
Per EMS pt c/o rt shoulder pain radiating down to lower back. States seen at [facility] yesterday.

## 2011-04-25 NOTE — ED Provider Notes (Signed)
History     CSN: [id number]  Arrival date & time 04/25/11  1446   First MD Initiated Contact with Patient 04/25/11 1534      Chief Complaint  Patient presents with  . Shoulder Pain    (Consider location/radiation/quality/duration/timing/severity/associated sxs/prior treatment) Patient is a 56 y.o. female presenting with shoulder pain. The history is provided by the patient.  Shoulder Pain This is a new (sharp, moderate intensity) problem. Episode onset: 2 days ago. The problem occurs constantly. The problem has been unchanged. Associated symptoms include coughing. Pertinent negatives include no abdominal pain, chest pain, chills, fatigue, fever, joint swelling, nausea, neck pain, numbness, rash, vomiting or weakness. Associated symptoms comments: Cough x 2 week. The symptoms are aggravated by bending, coughing and twisting (palpation). She has tried nothing for the symptoms.  Pain to upper back and right shoulder. NKI. Seen and eval for this problem at [facility] last night with no changes since that time. Unable to afford to fill medications.  Past Medical History  Diagnosis Date  . Asthma   . Hypertension     History reviewed. No pertinent past surgical history.  No family history on file.  History  Substance Use Topics  . Smoking status: Former Games developer  . Smokeless tobacco: Not on file  . Alcohol Use: No     Review of Systems  Constitutional: Negative for fever, chills and fatigue.  HENT: Negative for neck pain and neck stiffness.   Respiratory: Positive for cough. Negative for chest tightness and shortness of breath.   Cardiovascular: Negative for chest pain and palpitations.  Gastrointestinal: Negative for nausea, vomiting and abdominal pain.  Musculoskeletal: Positive for back pain. Negative for joint swelling and gait problem.  Skin: Negative for color change and rash.  Neurological: Negative for weakness and numbness.    Allergies  Ace inhibitors  Home Medications     Current Outpatient Rx  Name Route Sig Dispense Refill  . HYDROCHLOROTHIAZIDE 25 MG PO TABS Oral Take 25 mg by mouth daily.    Marland Kitchen METOPROLOL TARTRATE 25 MG PO TABS Oral Take 25 mg by mouth 2 (two) times daily.    . CYCLOBENZAPRINE HCL 5 MG PO TABS Oral Take 1 tablet (5 mg total) by mouth 3 (three) times daily as needed for muscle spasms. 30 tablet 0  . DICLOFENAC POTASSIUM 50 MG PO TABS Oral Take 1 tablet (50 mg total) by mouth 3 (three) times daily. For back pain 30 tablet 0    BP 132/99  Pulse 64  Temp(Src) 98.2 F (36.8 C) (Oral)  Resp 18  Wt 254 lb (115.214 kg)  SpO2 100%  LMP 08/07/2010  Physical Exam  Nursing note and vitals reviewed. Constitutional: She is oriented to person, place, and time. She appears well-developed and well-nourished. No distress.       Uncomfortable appearing  HENT:  Head: Normocephalic and atraumatic.  Right Ear: External ear normal.  Left Ear: External ear normal.  Nose: Nose normal.  Eyes: EOM are normal. Pupils are equal, round, and reactive to light.  Neck: Normal range of motion. Neck supple.  Cardiovascular: Normal rate, regular rhythm and intact distal pulses.   No murmur heard. Pulmonary/Chest: Effort normal and breath sounds normal. No respiratory distress. She exhibits no tenderness.  Abdominal: Soft. Bowel sounds are normal. She exhibits no distension. There is no tenderness. There is no rigidity, no rebound, no guarding and negative Murphy's sign.  Musculoskeletal: Normal range of motion. She exhibits no edema.  Right shoulder: She exhibits normal range of motion, no bony tenderness, no crepitus, no deformity, normal pulse and normal strength.       Cervical back: She exhibits normal range of motion, no tenderness, no bony tenderness and no deformity.       Thoracic back: She exhibits normal range of motion, no tenderness, no bony tenderness and no deformity.       Lumbar back: She exhibits normal range of motion, no tenderness, no  bony tenderness and no deformity.       Arms: Neurological: She is alert and oriented to person, place, and time. Coordination normal.  Skin: Skin is warm and dry. No rash noted.  Psychiatric: Her behavior is normal.    ED Course  Procedures (including critical care time)  Labs Reviewed - No data to display Dg Ribs Unilateral W/chest Right  04/25/2011  *RADIOLOGY REPORT*  Clinical Data: Upper rib pain and soreness, cough, history bronchitis, hypertension, asthma  RIGHT RIBS AND CHEST - 3+ VIEW  Comparison: 03/07/2009 chest radiographs  Findings: Normal heart size, mediastinal contours, and pulmonary vascularity. Minimal peribronchial thickening without infiltrate or effusion. No pneumothorax. Multilevel endplate spur formation thoracic spine. No rib fracture or bone destruction.  IMPRESSION: No acute right rib abnormalities.  Original Report Authenticated By: Lollie Marrow, M.D.     Dx 1: Musculoskeletal strain   MDM  Suspect muscle strain secondary to cough x 2 weeks. No respiratory s/s. Do not suspect PE- HR normal, PulseOx 100%, no tachypnea, pain reproducible.  Advised pt to try to find money to fill Rx previously given as both are inexpensive. Toradol and valium given in ED for pain relief.         Elwyn Reach [place], [place] 04/25/11 1849

## 2011-04-25 NOTE — ED Notes (Signed)
Pt states "came by ambulance, was seen [facility] last pm, I told them I didn't have the money to get the meds filled, I just want to know what's wrong"; pt indicates pain is in right shoulder & radiates into right scapula

## 2011-04-26 NOTE — ED Provider Notes (Signed)
Medical screening examination/treatment/procedure(s) were performed by non-physician practitioner and as supervising physician I was immediately available for consultation/collaboration.  Doug Sou, MD 04/26/11 0020

## 2011-05-12 ENCOUNTER — Inpatient Hospital Stay (HOSPITAL_COMMUNITY)
Admission: AD | Admit: 2011-05-12 | Discharge: 2011-05-12 | Disposition: A | Discharge status: Home / Self Care | Payer: Self-pay | Source: Ambulatory Visit | Attending: Obstetrics & Gynecology | Admitting: Obstetrics & Gynecology

## 2011-05-12 ENCOUNTER — Encounter (HOSPITAL_COMMUNITY): Payer: Self-pay | Admitting: *Deleted

## 2011-05-12 DIAGNOSIS — A6000 Herpesviral infection of urogenital system, unspecified: Secondary | ICD-10-CM | POA: Insufficient documentation

## 2011-05-12 DIAGNOSIS — A5901 Trichomonal vulvovaginitis: Secondary | ICD-10-CM | POA: Insufficient documentation

## 2011-05-12 DIAGNOSIS — N949 Unspecified condition associated with female genital organs and menstrual cycle: Secondary | ICD-10-CM | POA: Insufficient documentation

## 2011-05-12 DIAGNOSIS — B009 Herpesviral infection, unspecified: Secondary | ICD-10-CM

## 2011-05-12 LAB — WET PREP, GENITAL: Yeast Wet Prep HPF POC: NONE SEEN

## 2011-05-12 MED ORDER — METRONIDAZOLE 500 MG PO TABS: 2000.0000 mg | ORAL_TABLET | Freq: Once | ORAL | Status: AC

## 2011-05-12 MED ORDER — ACYCLOVIR 400 MG PO TABS: ORAL_TABLET | ORAL | Status: DC

## 2011-05-12 NOTE — ED Provider Notes (Signed)
History    55yo presents to MAU with vaginal burning/lesions/odor she has had "for a long time".  Pt unable to give exact length of time but reports burning for "at least months".  She says lesions and burning happen "every now and then, sometimes with my periods".  Reports irregular periods with LMP in December. Pt has hx of tubal ligation.  Chief Complaint  Patient presents with  . Vaginal Discharge   HPI  OB History    Grav Para Term Preterm Abortions TAB SAB Ect Mult Living   1 1 1       1       Past Medical History  Diagnosis Date  . Asthma   . Hypertension     History reviewed. No pertinent past surgical history.  No family history on file.  History  Substance Use Topics  . Smoking status: Former Games developer  . Smokeless tobacco: Not on file  . Alcohol Use: No    Allergies:  Allergies  Allergen Reactions  . Ace Inhibitors Swelling    Prescriptions prior to admission  Medication Sig Dispense Refill  . diclofenac (CATAFLAM) 50 MG tablet Take 1 tablet (50 mg total) by mouth 3 (three) times daily. For back pain  30 tablet  0  . hydrochlorothiazide (HYDRODIURIL) 25 MG tablet Take 25 mg by mouth daily.      . metoprolol tartrate (LOPRESSOR) 25 MG tablet Take 25 mg by mouth 2 (two) times daily.        Review of Systems  Constitutional: Negative for fever and chills.  Genitourinary: Positive for dysuria.  Neurological: Negative for headaches.   Physical Exam   Blood pressure 121/70, pulse 79, temperature 98.6 F (37 C), temperature source Oral, resp. rate 18, height 5' 1.5" (1.562 m), weight 114.669 kg (252 lb 12.8 oz), last menstrual period 04/08/2011, SpO2 98.00%.  Physical Exam  Constitutional: She appears well-developed and well-nourished.  Respiratory: Effort normal.  GI: Soft.  Genitourinary: There is tenderness and lesion on the right labia. Cervix exhibits friability. Cervix exhibits no motion tenderness.       Pelvic exam: Cervix pink with mottled red  patches, friable with cotton swab. 4-5 open white lesions 0.25-0.5 cm in size noted on right labia, and right vaginal wall, painful to touch and friable.  Bimanual exam: No CMT, cervix 0/lng/hi, adnexa with no masses or enlargement, uterus nonpregnant size     MAU Course  Procedures Pelvic exam with wet prep, GC/Chlamydia HSV culture   Assessment and Plan  A: Recurrent HSV outbreak Trichomoniasis  P: D/C home  Prescriptions for acyclovir 800 mg TID for 2 days and Flagyl 2g x1 dose STI teaching done  LEFTWICH-KIRBY, Platon Arocho 05/12/2011, 12:47 PM

## 2011-05-12 NOTE — Progress Notes (Signed)
Patient states she has had vaginal burning with a discharge with odor for a long time.

## 2011-05-14 LAB — HERPES SIMPLEX VIRUS CULTURE

## 2011-05-16 NOTE — ED Provider Notes (Signed)
Agree with note, results reviewed

## 2011-06-19 ENCOUNTER — Encounter: Payer: Self-pay | Admitting: Advanced Practice Midwife

## 2011-06-19 ENCOUNTER — Ambulatory Visit (INDEPENDENT_AMBULATORY_CARE_PROVIDER_SITE_OTHER): Payer: Self-pay | Admitting: Advanced Practice Midwife

## 2011-06-19 VITALS — BP 120/80 | HR 102 | Temp 98.1°F | Ht 63.0 in | Wt 247.6 lb

## 2011-06-19 DIAGNOSIS — B009 Herpesviral infection, unspecified: Secondary | ICD-10-CM

## 2011-06-19 MED ORDER — ACYCLOVIR 400 MG PO TABS: ORAL_TABLET | ORAL | Status: DC

## 2011-06-19 NOTE — Patient Instructions (Signed)
Genital Herpes Genital herpes is a sexually transmitted disease. This means that it is a disease passed by having sex with an infected person. There is no cure for genital herpes. The time between attacks can be months to years. The virus may live in a person but produce no problems (symptoms). This infection can be passed to a baby as it travels down the birth canal (vagina). In a newborn, this can cause central nervous system damage, eye damage, or even death. The virus that causes genital herpes is usually HSV-2 virus. The virus that causes oral herpes is usually HSV-1. The diagnosis (learning what is wrong) is made through culture results. SYMPTOMS  Usually symptoms of pain and itching begin a few days to a week after contact. It first appears as small blisters that progress to small painful ulcers which then scab over and heal after several days. It affects the outer genitalia, birth canal, cervix, penis, anal area, buttocks, and thighs. HOME CARE INSTRUCTIONS   Keep ulcerated areas dry and clean.   Take medications as directed. Antiviral medications can speed up healing. They will not prevent recurrences or cure this infection. These medications can also be taken for suppression if there are frequent recurrences.   While the infection is active, it is contagious. Avoid all sexual contact during active infections.   Condoms may help prevent spread of the herpes virus.   Practice safe sex.   Wash your hands thoroughly after touching the genital area.   Avoid touching your eyes after touching your genital area.   Inform your caregiver if you have had genital herpes and become pregnant. It is your responsibility to insure a safe outcome for your baby in this pregnancy.   Only take over-the-counter or prescription medicines for pain, discomfort, or fever as directed by your caregiver.  SEEK MEDICAL CARE IF:   You have a recurrence of this infection.   You do not respond to medications and  are not improving.   You have new sources of pain or discharge which have changed from the original infection.   You have an oral temperature above 102 F (38.9 C).   You develop abdominal pain.   You develop eye pain or signs of eye infection.  Document Released: 03/28/2000 Document Revised: 03/20/2011 Document Reviewed: 04/18/2009 ExitCare Patient Information 2012 [employer]. 

## 2011-06-19 NOTE — Progress Notes (Signed)
  Subjective:    Patient ID: Linda Cabrera, female    DOB: [birth year], 56 y.o.   MRN: [id number]  HPI Here for F/U MAU Dx HSV 2 and Trich. Good relief of Sx w/ Acyclovir and Flagyl. Wants refills or Acyclovir. Husband notified of Dx. Has appt w/ PCP for Tx. Using condoms.    Review of Systems: Neg     Objective:   Physical Exam: Deferred.     Assessment & Plan:   1. HSV-2 (herpes simplex virus 2) infection  acyclovir (ZOVIRAX) 400 MG tablet  Needs annual and Pap. Applying for orange card. Will schedule annual if approved or go to cervical CA screening. Mammogram due 5/13. Application for scholarship given. Rec continuing condom use. Discussed acyclovir prophylaxis PRN for reduced chance of transmission to partner and reduce outbreaks.  Dorathy Kinsman 06/19/2011 3:28 PM

## 2011-06-26 ENCOUNTER — Other Ambulatory Visit (HOSPITAL_COMMUNITY): Payer: Self-pay | Admitting: Internal Medicine

## 2011-06-26 DIAGNOSIS — Z1231 Encounter for screening mammogram for malignant neoplasm of breast: Secondary | ICD-10-CM

## 2011-08-27 ENCOUNTER — Ambulatory Visit (HOSPITAL_COMMUNITY)
Admission: RE | Admit: 2011-08-27 | Discharge: 2011-08-27 | Disposition: A | Discharge status: Home / Self Care | Payer: Self-pay | Source: Ambulatory Visit | Attending: Internal Medicine | Admitting: Internal Medicine

## 2011-08-27 DIAGNOSIS — Z1231 Encounter for screening mammogram for malignant neoplasm of breast: Secondary | ICD-10-CM

## 2011-09-25 ENCOUNTER — Encounter (HOSPITAL_COMMUNITY): Payer: Self-pay | Admitting: *Deleted

## 2011-09-25 ENCOUNTER — Emergency Department (HOSPITAL_COMMUNITY)
Admission: EM | Admit: 2011-09-25 | Discharge: 2011-09-25 | Disposition: A | Discharge status: Home / Self Care | Payer: Self-pay | Attending: Emergency Medicine | Admitting: Emergency Medicine

## 2011-09-25 ENCOUNTER — Emergency Department (HOSPITAL_COMMUNITY): Payer: Self-pay

## 2011-09-25 DIAGNOSIS — I10 Essential (primary) hypertension: Secondary | ICD-10-CM | POA: Insufficient documentation

## 2011-09-25 DIAGNOSIS — Z79899 Other long term (current) drug therapy: Secondary | ICD-10-CM | POA: Insufficient documentation

## 2011-09-25 DIAGNOSIS — R109 Unspecified abdominal pain: Secondary | ICD-10-CM

## 2011-09-25 DIAGNOSIS — Z7982 Long term (current) use of aspirin: Secondary | ICD-10-CM | POA: Insufficient documentation

## 2011-09-25 DIAGNOSIS — K8020 Calculus of gallbladder without cholecystitis without obstruction: Secondary | ICD-10-CM

## 2011-09-25 DIAGNOSIS — J45909 Unspecified asthma, uncomplicated: Secondary | ICD-10-CM | POA: Insufficient documentation

## 2011-09-25 LAB — DIFFERENTIAL
Eosinophils Relative: 3 % (ref 0–5)
Lymphocytes Relative: 29 % (ref 12–46)
Monocytes Relative: 6 % (ref 3–12)
Neutrophils Relative %: 62 % (ref 43–77)
Smear Review: ADEQUATE

## 2011-09-25 LAB — BASIC METABOLIC PANEL
Chloride: 103 mEq/L (ref 96–112)
Creatinine, Ser: 0.97 mg/dL (ref 0.50–1.10)
GFR calc Af Amer: 75 mL/min — ABNORMAL LOW (ref >90)
Potassium: 3.7 mEq/L (ref 3.5–5.1)

## 2011-09-25 LAB — CBC
Platelets: 266 10*3/uL (ref 150–400)
RBC: 4.76 MIL/uL (ref 3.87–5.11)
WBC: 8.8 10*3/uL (ref 4.0–10.5)

## 2011-09-25 LAB — HEPATIC FUNCTION PANEL
AST: 17 U/L (ref 0–37)
Albumin: 3.1 g/dL — ABNORMAL LOW (ref 3.5–5.2)
Alkaline Phosphatase: 71 U/L (ref 39–117)
Total Bilirubin: 0.1 mg/dL — ABNORMAL LOW (ref 0.3–1.2)
Total Protein: 7.2 g/dL (ref 6.0–8.3)

## 2011-09-25 LAB — URINALYSIS, ROUTINE W REFLEX MICROSCOPIC
Glucose, UA: NEGATIVE mg/dL
Protein, ur: NEGATIVE mg/dL
Specific Gravity, Urine: 1.02 (ref 1.005–1.030)
Urobilinogen, UA: 0.2 mg/dL (ref 0.0–1.0)

## 2011-09-25 LAB — LIPASE, BLOOD: Lipase: 44 U/L (ref 11–59)

## 2011-09-25 LAB — URINE MICROSCOPIC-ADD ON

## 2011-09-25 MED ORDER — MORPHINE SULFATE 4 MG/ML IJ SOLN
4.0000 mg | Freq: Once | INTRAMUSCULAR | Status: AC
  Administered 2011-09-25: 4 mg via INTRAVENOUS
  Filled 2011-09-25: qty 1

## 2011-09-25 MED ORDER — SODIUM CHLORIDE 0.9 % IV BOLUS (SEPSIS)
1000.0000 mL | Freq: Once | INTRAVENOUS | Status: AC
  Administered 2011-09-25: 1000 mL via INTRAVENOUS

## 2011-09-25 MED ORDER — HYDROCODONE-ACETAMINOPHEN 5-325 MG PO TABS: 1.0000 | ORAL_TABLET | Freq: Four times a day (QID) | ORAL | Status: AC | PRN

## 2011-09-25 MED ORDER — ONDANSETRON HCL 4 MG/2ML IJ SOLN
4.0000 mg | Freq: Once | INTRAMUSCULAR | Status: AC
  Administered 2011-09-25: 4 mg via INTRAVENOUS
  Filled 2011-09-25: qty 2

## 2011-09-25 NOTE — ED Notes (Signed)
Patient was able to drink sprite and hold it down.

## 2011-09-25 NOTE — ED Provider Notes (Signed)
1:51 PM Patient is in CDU holding for CT abd/pelvis and CXR.  Pt with right flank pain and nausea x 3 days.  Pt reports pain is now 7/10, nausea has resolved.  On exam, pt is A&Ox4, NAD, RRR, CTAB, abdomen is obese, soft, nontender*, no guarding, no rebound, umbilical hernia is soft.  *exam is slightly inconsistent.  Pt has intermittent tenderness in RUQ.    2:06 PM Discussed CT and CXR results with patient and with Dr Bebe Shaggy.  Discussed gallstones at length with patient, including symptoms, surgical follow up if desired, return precautions.  Per Dr Bebe Shaggy, plan is for LFTs, lipase, pain control, PO trial.  If patient improves and labs are unremarkable, pt to be d/c home.  If unable to tolerate PO or pain uncontrolled, will obtain ultrasound.    4:01 PM Discussed LFTs and lipase results with patient reports continued mild pain - located in her right lower back, but much improved from prior.  Will do PO trial, anticipate d/c home.   4:29 PM Patient passed PO trial.  D/C Home with #10 vicodin, PCP resources for follow up, return precautions.  Also information about cholelithiasis and [place] surgery f/u if needed/wanted for evaluation/treatment.  Patient verbalizes understanding and agrees with plan.    Rise Patience, [place] 09/25/11 1630

## 2011-09-25 NOTE — ED Provider Notes (Signed)
Medical screening examination/treatment/procedure(s) were conducted as a shared visit with non-physician practitioner(s) and myself.  I personally evaluated the patient during the encounter   Joya Gaskins, MD 09/25/11 2027

## 2011-09-25 NOTE — ED Notes (Signed)
PA HAS BEEN IN TO DISCUSS RESULTS OF HER STUDIES

## 2011-09-25 NOTE — ED Notes (Signed)
Pt reports right flank pain x 3 days. States pain intermittant, but becoming more constant. Reports urinary frequency, but no blood in urine. Nausea, no vomiting. Denies fever/chills. No hx of kidney stones.

## 2011-09-25 NOTE — ED Provider Notes (Signed)
I have personally seen and examined the patient.  I have discussed the plan of care with the resident.  I have reviewed the documentation on PMH/FH/Soc. History.  I have reviewed the documentation of the resident and agree.  I have reviewed and agree with the ECG interpretation(s) documented by the resident.  Pt difficult historian, mentioned some SOB but main complaint flank pain,  Thorough workup did not reveal acute process.  Doubt acs/pe as cause of vague SOB she reported.  Pt otherwise well appearing  Joya Gaskins, MD 09/25/11 2027

## 2011-09-25 NOTE — ED Notes (Signed)
Called jaquetta in the lab, she will add on the ordered tests so pt will not have to be stuck again

## 2011-09-25 NOTE — Discharge Instructions (Signed)
Read the information below.  Please use the resources below to find a primary care provider if you do not have one.  If you develop uncontrolled pain, high fevers, vomiting, or inability to tolerate fluids by mouth, return to the ER for a recheck.  You may return to the ER at any time for worsening condition or any new symptoms that concern you.   Abdominal Pain Abdominal pain can be caused by many things. Your caregiver decides the seriousness of your pain by an examination and possibly blood tests and X-rays. Many cases can be observed and treated at home. Most abdominal pain is not caused by a disease and will probably improve without treatment. However, in many cases, more time must pass before a clear cause of the pain can be found. Before that point, it may not be known if you need more testing, or if hospitalization or surgery is needed. HOME CARE INSTRUCTIONS   Do not take laxatives unless directed by your caregiver.   Take pain medicine only as directed by your caregiver.   Only take over-the-counter or prescription medicines for pain, discomfort, or fever as directed by your caregiver.   Try a clear liquid diet (broth, tea, or water) for as long as directed by your caregiver. Slowly move to a bland diet as tolerated.  SEEK IMMEDIATE MEDICAL CARE IF:   The pain does not go away.   You have a fever.   You keep throwing up (vomiting).   The pain is felt only in portions of the abdomen. Pain in the right side could possibly be appendicitis. In an adult, pain in the left lower portion of the abdomen could be colitis or diverticulitis.   You pass bloody or black tarry stools.  MAKE SURE YOU:   Understand these instructions.   Will watch your condition.   Will get help right away if you are not doing well or get worse.  Document Released: 01/08/2005 Document Revised: 03/20/2011 Document Reviewed: 11/17/2007 [facility] Patient Information 2012 [place], [place]. Flank Pain Flank pain  is pain in your side.  HOME CARE  Home care and treatment will depend on the cause of your pain.   Some medicines can help relieve the pain. Take all medicine as told by your doctor.   Tell your doctor about any changes in your pain.   Follow up with your doctor.  GET HELP RIGHT AWAY IF:   Your pain does not get better with medicine.   The pain gets worse.   You have belly (abdominal) pain.   You are short of breath.   You always feel sick to your stomach (nauseous).   You keep throwing up (vomiting).   You have puffiness (swelling) in your belly.   You pass out (faint).   You have a temperature by mouth above 102 F (38.9 C), not controlled by medicine.  MAKE SURE YOU:   Understand these instructions.   Will watch your condition.   Will get help right away if you are not doing well or get worse.  Document Released: 01/08/2008 Document Revised: 03/20/2011 Document Reviewed: 09/15/2009 [facility] Patient Information 2012 [place], [place].  If you have no primary doctor, here are some resources that may be helpful:  Medicaid-accepting [facility] Providers:   - [facility]- [address]      [phone]      Mon-Fri 9am-7pm, Sat 9am-1pm   - Celesta Gentile [facility]- [address] [place], Suite  [phone]   - [facility]- [address] [place]      [phone]   [facility]- [address]      [phone]   - Renaye Rakers- [address] [place], [address]      [phone]      Only accepts [place] patients       after they have her name applied to their card   Self Pay (no insurance) in [place]:   - Sickle Cell Patients: Dr Willey Blade, [facility]      [address] [place]      [phone]   - Health [phone]   - Physician Referral Service- [phone]   - [facility]- [address] [place]      [phone]   [facility] [place]- [address]   - [facility]- see information above      (Speak to [employer] if you do not have insurance)   - [facility]- [address] [place]      [phone]   - [facility] [place]- [address]      [phone]   - [facility]- [address]      [phone]   - Dr Julio Sicks-  [address], [place]      [phone]   - [facility]- [address]      [phone]   - [facility]- [address]      [phone]      Also [address]      [phone]   - [facility]- [address]      [phone]      1st and 3rd Saturday every month, 10am-1pm Other agencies that provide inexpensive medical care:    [facility]  [phone]    [facility]  [phone]    [facility]  [phone]    [facility]  [phone]    [facility]  [phone]  General Information: Finding a doctor when you do not have health insurance can be tricky. Although you are not limited by an insurance plan, you are of course limited by her finances and how much but he can pay out of pocket.  What are your options if you don't have health insurance?   1) Find a Librarian, academic and Pay Out of Pocket Although you won't have to find out who is covered by your insurance plan, it is a good idea to ask around and get recommendations. You will then need to call the office and see if the doctor you have chosen will accept you as a new patient and what types of options they offer for patients who are self-pay. Some doctors offer discounts or will set up payment plans for their patients who do not have insurance, but you will need to ask so you aren't surprised when you get to your appointment.  2) Contact Your Local Health Department Not all health departments have doctors that can see patients for sick visits, but many do, so it is worth a call to see if yours  does. If you don't know where your local health department is, you can check in your phone book. The CDC also has a tool to  help you locate your state's health department, and many state websites also have listings of all of their local health departments.  3) Find a Walk-in Clinic If your illness is not likely to be very severe or complicated, you may want to try a walk in clinic. These are popping up all over the country in pharmacies, drugstores, and shopping centers. They're usually staffed by nurse practitioners or physician assistants that have been trained to treat common illnesses and complaints. They're usually fairly quick and inexpensive. However, if you have serious medical issues or chronic medical problems, these are probably not your best option   RESOURCE GUIDE  Chronic Pain Problems: Contact [facility]  [phone] Patients need to be referred by their primary care doctor.  Insufficient Money for Medicine: Contact United Way:  call "211" or [facility] [phone].  No Primary Care Doctor: - Call Health Connect  [phone] - can help you locate a primary care doctor that  accepts your insurance, provides certain services, etc. - Physician Referral Service- [phone]  Agencies that provide inexpensive medical care: - [facility]  [phone] - [facility]  [phone] - [facility]  [phone] - Women's Clinic  [phone] - Planned Parenthood  [phone] [facility]  [phone]  Medicaid-accepting [facility] Providers: - [facility]- [address]  [phone], Mon-Fri 9am-7pm, Sat 9am-1pm - [facility] [place]- [address] [place], [address] [place]  [phone] - [facility]- [address] [place]  [phone] [facility]- [address]  [phone] - Renaye Rakers- [address] [place], [address],  [phone]  Only accepts [place] patients after they have their name  applied to their card  Self Pay (no insurance) in [place]: - Sickle Cell Patients: Dr Willey Blade, [facility]  [address] [place], [phone] - [facility]- [address] [place]  [phone]       [facility] [place]- [address]       -     [facility]- see information above (Speak to [employer] if you do not have insurance)       -  [facility]- [address] [place], [phone]       -  [facility]- [address],  [phone]       -  [facility]- [address] [phone]       -  Dr Julio Sicks-  [address], [place], [phone]       -  [facility]- [address] [phone]       -  [facility]- [address], [phone], also [address], [phone]       -    [facility]- [address] [place], [phone], 1st & 3rd Saturday   every month, 10am-1pm  1) Find a Doctor and Pay Out of Pocket Although you won't have to find out who is covered by your insurance plan, it is a good idea to ask around and get recommendations. You will then need to call the office and see if the doctor you have chosen will accept you as a new patient and what types of options they offer for patients who  are self-pay. Some doctors offer discounts or will set up payment plans for their patients who do not have insurance, but you will need to ask so you aren't surprised when you get to your appointment.  2) Contact Your Local Health Department Not all health departments have doctors that can see patients for sick visits, but many do, so it is worth a call to see if yours does. If you don't know where your local health department is, you can check in your phone book. The CDC also has a tool to help you locate your state's health department, and many state websites also have  listings of all of their local health departments.  3) Find a Walk-in Clinic If your illness is not likely to be very severe or complicated, you may want to try a walk in clinic. These are popping up all over the country in pharmacies, drugstores, and shopping centers. They're usually staffed by nurse practitioners or physician assistants that have been trained to treat common illnesses and complaints. They're usually fairly quick and inexpensive. However, if you have serious medical issues or chronic medical problems, these are probably not your best option  STD Testing - [facility] Department of [facility], STD Clinic, [address] [place], phone [phone] or [phone].  Monday - Friday, call for an appointment. [facility] Department of [employer], STD Clinic, [place] [address], [place], phone [phone] or [phone].  Monday - Friday, call for an appointment.  Abuse/Neglect: [facility] Child Abuse Hotline 276-574-3442 [facility] Child Abuse Hotline 424-877-6957 (After Hours)  Emergency Shelter:  [facility] (204)572-0870  Maternity Homes: - [facility] at the [place] [facility] (661)375-4263 - [facility] [phone]  MRSA Hotline #:   (747)047-8569  [facility] Resources  [facility] [place]  United [employer] [facility] Dept. [address]                 [address]         [address] [place] [address]  Blondell Reveal Phone:  [phone]                                  Phone:  [phone]                   Phone:  [phone]  [facility] Mental [facility], [phone] - [phone]       -     [facility] in [place], [address]                                  [phone],  Insurance  [place] Child Abuse Hotline (567) 292-5038 or 843-827-2708 (After Hours)   Behavioral Health Services  Substance Abuse Resources: - Alcohol  and Drug Services  [phone] - [facility] 423-565-2134 - [facility] [place] (920)762-1698 Floydene Flock [phone] - Residential & Outpatient Substance Abuse Program  ([phone]  Psychological Services: [facility]  [phone] Services  ([phone] - [facility], [phone] [place]. [address] [place], ACCESS LINE: 813-134-8129 or (507)[phone], Ent[employer]urLoan.co.za  Dental Assistance  If unable to pay or uninsured, contact:  [facility] or [facility]. to become qualified for the adult dental clinic.  Patients with Medicaid: [facility] [phone] [address], [phone] [address] [phone]  If unable to pay, or uninsured, contact [facility] [phone]) or [facility] Department [phone] in [place], [phone] in [facility]) to become qualified for the adult dental clinic  Other Low-Cost Community Dental Services: - [facility]- [address] [place], [place], [place], [address], [phone], [address]. [phone], 2nd and 4th Thursday of the month at 6:30am.  10 clients each day by appointment, can sometimes see walk-in patients if someone does not show for an appointment. [facility]- [address] [place] [address] Ether Griffins [place], [place], [address], [phone] - [facility]- [address] [place], [place], [address], [phone] [facility] Health Department- [phone] [facility] Health Department- [phone] [facility] [phone]   Gallstones Gallstones are a form of gallbladder disease. The gallbladder is a small organ that helps you digest food.  HOME CARE  Only take medicine as told by your doctor.   Eat a low-fat diet.   Follow up as  told.  GET HELP RIGHT AWAY IF:   Your pain gets worse.   You develop yellow skin or eyes (jaundice).   The pain moves to another part of your belly (abdomen) or back.   You have a temperature by mouth above 102 F (38.9 C), not controlled by medicine.   You feel sick to your stomach (nauseous) and throw up (vomit).  MAKE SURE YOU:   Understand these instructions.   Will watch your condition.   Will get help right away if you are not doing well or get worse.  Document Released: 09/17/2007 Document Revised: 03/20/2011 Document Reviewed: 03/06/2009 [facility] Patient Information 2012 [place], [place].

## 2011-09-25 NOTE — ED Notes (Signed)
Pt reports 2 day hx of right sided flank pain with increase in urination.

## 2011-09-25 NOTE — ED Notes (Signed)
Patient transported to X-ray 

## 2011-09-25 NOTE — ED Notes (Signed)
MD at bedside. 

## 2011-09-25 NOTE — ED Provider Notes (Signed)
History     CSN: [id number]  Arrival date & time 09/25/11  0944   First MD Initiated Contact with Patient 09/25/11 [phone]      Chief Complaint  Patient presents with  . Flank Pain    Patient is a 56 y.o. female presenting with flank pain. The history is provided by the patient.  Flank Pain This is a new problem. The current episode started in the past 7 days (3 days ago). The problem occurs intermittently. The problem has been gradually worsening. Associated symptoms include abdominal pain (right side and right flank pain), nausea and urinary symptoms. Pertinent negatives include no anorexia, chest pain, chills, coughing, diaphoresis, fatigue, fever, neck pain or vomiting. Nothing aggravates the symptoms. Treatments tried: Aspirin. The treatment provided no relief.    Past Medical History  Diagnosis Date  . Asthma   . Hypertension     History reviewed. No pertinent past surgical history.  History reviewed. No pertinent family history.  History  Substance Use Topics  . Smoking status: Former Games developer  . Smokeless tobacco: Not on file  . Alcohol Use: No    OB History    Grav Para Term Preterm Abortions TAB SAB Ect Mult Living   1 1 1       1       Review of Systems  Constitutional: Negative for fever, chills, diaphoresis, activity change, appetite change and fatigue.  HENT: Negative for neck pain and neck stiffness.   Respiratory: Negative for cough, shortness of breath and wheezing.   Cardiovascular: Negative for chest pain and palpitations.  Gastrointestinal: Positive for nausea and abdominal pain (right side and right flank pain). Negative for vomiting, diarrhea, constipation and anorexia.  Genitourinary: Positive for flank pain. Negative for dysuria, decreased urine volume and difficulty urinating.  Neurological: Negative for seizures, syncope, facial asymmetry and light-headedness.  Psychiatric/Behavioral: Negative for confusion and agitation.  All other systems  reviewed and are negative.    Allergies  Ace inhibitors  Home Medications   Current Outpatient Rx  Name Route Sig Dispense Refill  . ASPIRIN 325 MG PO TABS Oral Take 325-650 mg by mouth daily as needed. Taking for pain; takes 1-2 tablets depending on pain level.  She took only 1 tablet today.    Marland Kitchen HYDROCHLOROTHIAZIDE 25 MG PO TABS Oral Take 25 mg by mouth daily.    Marland Kitchen METOPROLOL TARTRATE 25 MG PO TABS Oral Take 25 mg by mouth 2 (two) times daily.      BP 98/67  Pulse 58  Temp 98.6 F (37 C) (Oral)  Resp 16  SpO2 100%  LMP 08/01/2011  Physical Exam  Nursing note and vitals reviewed. Constitutional: She is oriented to person, place, and time. She appears well-developed and well-nourished.  HENT:  Head: Normocephalic and atraumatic.  Right Ear: External ear normal.  Left Ear: External ear normal.  Nose: Nose normal.  Mouth/Throat: Oropharynx is clear and moist. No oropharyngeal exudate.  Eyes: Conjunctivae are normal.  Neck: Normal range of motion.  Cardiovascular: Normal rate, regular rhythm, normal heart sounds and intact distal pulses.  Exam reveals no gallop and no friction rub.   No murmur heard. Pulmonary/Chest: Effort normal and breath sounds normal. No respiratory distress. She has no wheezes. She has no rales. She exhibits no tenderness.  Abdominal: Soft. Bowel sounds are normal. She exhibits no distension and no mass. There is tenderness (right side and flank). There is no rebound and no guarding.  Neurological: She is alert and oriented  to person, place, and time.  Skin: Skin is warm and dry.  Psychiatric: She has a normal mood and affect. Her behavior is normal. Judgment and thought content normal.    ED Course  Procedures (including critical care time)  Labs Reviewed  URINALYSIS, ROUTINE W REFLEX MICROSCOPIC - Abnormal; Notable for the following:    APPearance CLOUDY (*)     Leukocytes, UA SMALL (*)     All other components within normal limits  URINE  MICROSCOPIC-ADD ON - Abnormal; Notable for the following:    Squamous Epithelial / LPF FEW (*)     All other components within normal limits  CBC - Abnormal; Notable for the following:    Hemoglobin 9.8 (*)     HCT 32.4 (*)     MCV 68.1 (*)     MCH 20.6 (*)     RDW 19.7 (*)     All other components within normal limits  BASIC METABOLIC PANEL - Abnormal; Notable for the following:    GFR calc non Af Amer 65 (*)     GFR calc Af Amer 75 (*)     All other components within normal limits  DIFFERENTIAL  PREGNANCY, URINE  URINE CULTURE  HEPATIC FUNCTION PANEL  LIPASE, BLOOD   1. Right flank pain   2. Abdominal pain      Date: 09/25/2011  Rate: 44 bpm  Rhythm: sinus bradycardia  QRS Axis: normal  Intervals: normal  ST/T Wave abnormalities: normal  Conduction Disutrbances:none  Narrative Interpretation: No evidence of acute ischemia or arrythmia  Old EKG Reviewed: slower heart rate than previously; now sinus bradycardia   MDM  56 yo F presents with right flank pain of 3 days with radiation to groin. No fever/chills or urinary symptoms. No abdominal pain or vomiting. AFVSS. TTP overlying right side and flank. Urinalysis not c/w hematuria or UTI. Urine hCG negative. Clinical picture not concerning for ovarian torsion, AAA, appendicitis, or diverticulitis. Treating pain symptomatically in ED. Will obtain stone study to rule out ureterolithiasis. While awaiting study, will transfer care to CDU, who is apprised of course and plan.          Clemetine Marker, MD 09/25/11 1651

## 2011-09-26 LAB — URINE CULTURE

## 2012-07-03 ENCOUNTER — Emergency Department (HOSPITAL_COMMUNITY)
Admission: EM | Admit: 2012-07-03 | Discharge: 2012-07-03 | Disposition: A | Discharge status: Home / Self Care | Payer: Self-pay | Attending: Emergency Medicine | Admitting: Emergency Medicine

## 2012-07-03 ENCOUNTER — Other Ambulatory Visit: Payer: Self-pay | Admitting: Advanced Practice Midwife

## 2012-07-03 ENCOUNTER — Encounter (HOSPITAL_COMMUNITY): Payer: Self-pay | Admitting: *Deleted

## 2012-07-03 DIAGNOSIS — A6000 Herpesviral infection of urogenital system, unspecified: Secondary | ICD-10-CM | POA: Insufficient documentation

## 2012-07-03 DIAGNOSIS — I10 Essential (primary) hypertension: Secondary | ICD-10-CM | POA: Insufficient documentation

## 2012-07-03 DIAGNOSIS — Z87891 Personal history of nicotine dependence: Secondary | ICD-10-CM | POA: Insufficient documentation

## 2012-07-03 DIAGNOSIS — A6009 Herpesviral infection of other urogenital tract: Secondary | ICD-10-CM

## 2012-07-03 DIAGNOSIS — J45909 Unspecified asthma, uncomplicated: Secondary | ICD-10-CM | POA: Insufficient documentation

## 2012-07-03 DIAGNOSIS — Z79899 Other long term (current) drug therapy: Secondary | ICD-10-CM | POA: Insufficient documentation

## 2012-07-03 DIAGNOSIS — Z760 Encounter for issue of repeat prescription: Secondary | ICD-10-CM | POA: Insufficient documentation

## 2012-07-03 DIAGNOSIS — R21 Rash and other nonspecific skin eruption: Secondary | ICD-10-CM | POA: Insufficient documentation

## 2012-07-03 HISTORY — DX: Herpesviral infection, unspecified: B00.9

## 2012-07-03 MED ORDER — ACYCLOVIR 800 MG PO TABS
800.0000 mg | ORAL_TABLET | Freq: Once | ORAL | Status: AC
  Administered 2012-07-03: 800 mg via ORAL
  Filled 2012-07-03: qty 1

## 2012-07-03 MED ORDER — ACYCLOVIR 200 MG PO CAPS
800.0000 mg | ORAL_CAPSULE | Freq: Once | ORAL | Status: DC
  Filled 2012-07-03: qty 4

## 2012-07-03 MED ORDER — ACYCLOVIR 800 MG PO TABS: 800.0000 mg | ORAL_TABLET | Freq: Every day | ORAL | Status: DC

## 2012-07-03 MED ORDER — ACYCLOVIR 400 MG PO TABS: 400.0000 mg | ORAL_TABLET | Freq: Two times a day (BID) | ORAL | Status: DC

## 2012-07-03 NOTE — ED Notes (Addendum)
Patient with history of herpes and is out of medication.  She complains of vaginal itching and breakout.  Patient denies any other symptoms.  Patient also mentioned burning sensation in her vaginal area

## 2012-07-03 NOTE — ED Provider Notes (Signed)
History     CSN: [id number]  Arrival date & time 07/03/12  2004   First MD Initiated Contact with Patient 07/03/12 2041      Chief Complaint  Patient presents with  . Vaginal Itching    (Consider location/radiation/quality/duration/timing/severity/associated sxs/prior treatment) HPI Comments: Patient history of genital herpes, states, that after her menses.  She has an outbreak every month.  She started taking acyclovir on Thursday.  She had 2 days worth of medication, left, and her prescription refills.  Have expired.  She is here requesting refills, and continuation of her acyclovir.  Patient is a 57 y.o. female presenting with vaginal itching. The history is provided by the patient.  Vaginal Itching This is a recurrent problem. The current episode started in the past 7 days. The problem has been unchanged. Pertinent negatives include no fever. Nothing aggravates the symptoms. She has tried nothing for the symptoms.    Past Medical History  Diagnosis Date  . Asthma   . Hypertension   . Herpes     History reviewed. No pertinent past surgical history.  History reviewed. No pertinent family history.  History  Substance Use Topics  . Smoking status: Former Games developer  . Smokeless tobacco: Not on file  . Alcohol Use: No    OB History   Grav Para Term Preterm Abortions TAB SAB Ect Mult Living   1 1 1       1       Review of Systems  Constitutional: Negative for fever.  Genitourinary: Positive for genital sores. Negative for dysuria, vaginal bleeding and vaginal discharge.  All other systems reviewed and are negative.    Allergies  Ace inhibitors  Home Medications   Current Outpatient Rx  Name  Route  Sig  Dispense  Refill  . acyclovir (ZOVIRAX) 200 MG capsule   Oral   Take 800 mg by mouth 3 (three) times daily as needed (for breakout).         . hydrochlorothiazide (HYDRODIURIL) 25 MG tablet   Oral   Take 25 mg by mouth daily.         . metoprolol  tartrate (LOPRESSOR) 25 MG tablet   Oral   Take 25 mg by mouth 2 (two) times daily.         Marland Kitchen acyclovir (ZOVIRAX) 400 MG tablet   Oral   Take 1 tablet (400 mg total) by mouth 2 (two) times daily.   60 tablet   3   . acyclovir (ZOVIRAX) 800 MG tablet   Oral   Take 1 tablet (800 mg total) by mouth 5 (five) times daily.   34 tablet   0     BP 132/92  Pulse 70  Temp(Src) 98.8 F (37.1 C) (Oral)  SpO2 98%  Physical Exam  Nursing note and vitals reviewed. Constitutional: She appears well-developed.  Morbidly obese  HENT:  Head: Normocephalic.  Eyes: Pupils are equal, round, and reactive to light.  Neck: Normal range of motion.  Cardiovascular: Normal rate.   Pulmonary/Chest: Effort normal.  Abdominal: Soft. She exhibits no distension. There is no tenderness.  Genitourinary: There is rash on the right labia. There is rash on the left labia. No vaginal discharge found.  Herpetic-like lesions.  Bilateral mons at the labial cleft, as well as just inside on the labia minora    ED Course  Procedures (including critical care time)  Labs Reviewed - No data to display No results found.   1. Genital herpes in  women       MDM   Since patient has an outbreak every month.  Recommend that she just take the acyclovir.  Her on a daily basis as a suppressant.  I will give her 100 mg tablets every 4 hours.  For 7 days to be followed by 400 mg twice a day as a suppressant        Arman Filter, NP 07/03/12 2131

## 2012-07-03 NOTE — ED Notes (Signed)
Pt reports itching ,and pain in the vaginal region.

## 2012-07-04 NOTE — ED Provider Notes (Signed)
Medical screening examination/treatment/procedure(s) were performed by non-physician practitioner and as supervising physician I was immediately available for consultation/collaboration.   Gavin Pound. Junie Engram, MD 07/04/12 1514

## 2012-07-21 ENCOUNTER — Other Ambulatory Visit (HOSPITAL_COMMUNITY): Payer: Self-pay | Admitting: Internal Medicine

## 2012-08-04 ENCOUNTER — Other Ambulatory Visit (HOSPITAL_COMMUNITY): Payer: Self-pay | Admitting: Internal Medicine

## 2012-08-04 DIAGNOSIS — Z1231 Encounter for screening mammogram for malignant neoplasm of breast: Secondary | ICD-10-CM

## 2012-08-12 LAB — HM MAMMOGRAPHY

## 2012-08-30 ENCOUNTER — Ambulatory Visit (HOSPITAL_COMMUNITY)
Admission: RE | Admit: 2012-08-30 | Discharge: 2012-08-30 | Disposition: A | Discharge status: Home / Self Care | Payer: Self-pay | Source: Ambulatory Visit | Attending: Internal Medicine | Admitting: Internal Medicine

## 2012-08-30 DIAGNOSIS — Z1231 Encounter for screening mammogram for malignant neoplasm of breast: Secondary | ICD-10-CM

## 2012-12-03 ENCOUNTER — Telehealth: Payer: Self-pay | Admitting: General Practice

## 2012-12-03 NOTE — Telephone Encounter (Signed)
Patient called and left message stating she needs a refill on her medication.

## 2012-12-08 NOTE — Telephone Encounter (Signed)
Called Linda Cabrera and she states she needs a refill on the Zovirax because she had to go to the ER and was given a stronger dosage and told to take the Zovirax every day because she was having frequent outbreaks. States when she ran out of what the ER doctor gave her she went back to her original prescription she was given 3/14 in our clinic and now has only 1 pill left. States she has gone thru all 12 refills because she is taking 400mg  bid as instructed by the ER physician.  Informed her I would need to discuss with provider and call her back in next few days. Clarified she still uses same pharmacy - [facility].

## 2012-12-17 ENCOUNTER — Ambulatory Visit: Payer: Self-pay

## 2013-02-02 NOTE — Telephone Encounter (Signed)
Called Linda Cabrera and she states she went to a clinic on [address] And got a refill on her Zovirax- not sure how many refills she got. Informed patient she is due her yearly check up and then we can send in more refills as needed .Zalea voices understanding. Will have front desk to call her.

## 2013-04-27 ENCOUNTER — Ambulatory Visit: Payer: Self-pay | Admitting: Physician Assistant

## 2013-05-09 ENCOUNTER — Ambulatory Visit (INDEPENDENT_AMBULATORY_CARE_PROVIDER_SITE_OTHER): Payer: BC Managed Care – PPO | Admitting: Physician Assistant

## 2013-05-09 ENCOUNTER — Encounter: Payer: Self-pay | Admitting: Physician Assistant

## 2013-05-09 VITALS — BP 137/80 | HR 73 | Temp 98.0°F | Resp 18 | Ht 63.0 in | Wt 280.4 lb

## 2013-05-09 DIAGNOSIS — Z0000 Encounter for general adult medical examination without abnormal findings: Secondary | ICD-10-CM

## 2013-05-09 DIAGNOSIS — Z23 Encounter for immunization: Secondary | ICD-10-CM

## 2013-05-09 DIAGNOSIS — J45909 Unspecified asthma, uncomplicated: Secondary | ICD-10-CM

## 2013-05-09 MED ORDER — ALBUTEROL SULFATE HFA 108 (90 BASE) MCG/ACT IN AERS: 1.0000 | INHALATION_SPRAY | Freq: Four times a day (QID) | RESPIRATORY_TRACT | Status: DC | PRN

## 2013-05-09 NOTE — Progress Notes (Signed)
Subjective:    Patient ID: Linda Cabrera, female    DOB: 05-Nov-1955, 58 y.o.   MRN: [id number]  HPI Comments: Patient is a 58 year old female who presents to the clinic to establish care. Patient reports a history of hypertension well controlled with hydrochlorothiazide. History of recurrent herpes virus controlled with acyclovir. And a history of asthma. Patient reports she used to use albuterol inhaler for her asthma however has been out of the prescription for approximately 6 months. Reports she is currently not having frequent asthma exacerbations however they are triggered with increase in exercise as well as temperature changes at times. Is requesting refill of an inexpensive albuterol inhaler. States last mammogram was May of 2014, and would like a referral to GI for a colonoscopy and gynecology for a yearly exam. Denies chest pain, palpitations, chest tightness, extremity swelling, shortness of breath, headache, nausea, vomiting, change in bowel or bladder habits, visual disturbances/changes.   Review of Systems  Constitutional: Negative for fever and chills.  Eyes: Negative for pain and visual disturbance.  Respiratory: Positive for shortness of breath (history of asthma, no acute symptoms). Negative for cough, chest tightness and wheezing.   Cardiovascular: Negative for chest pain, palpitations and leg swelling.  Gastrointestinal: Negative for nausea, vomiting, diarrhea and constipation.  Skin: Negative for rash.  Neurological: Negative for dizziness, weakness, light-headedness and numbness.  All other systems reviewed and are negative.      Objective:   Physical Exam  Vitals reviewed. Constitutional: She is oriented to person, place, and time. She appears well-developed and well-nourished. No distress.  HENT:  Head: Normocephalic and atraumatic.  Right Ear: Hearing, tympanic membrane, external ear and ear canal normal.  Left Ear: Hearing, tympanic membrane, external ear and  ear canal normal.  Nose: Nose normal.  Mouth/Throat: Uvula is midline, oropharynx is clear and moist and mucous membranes are normal.  Eyes: Conjunctivae and EOM are normal. Pupils are equal, round, and reactive to light.  Neck: Trachea normal and normal range of motion.  Cardiovascular: Normal rate and regular rhythm.   Pulses:      Radial pulses are 2+ on the left side.       Dorsalis pedis pulses are 2+ on the right side, and 2+ on the left side.  Pulmonary/Chest: Breath sounds normal. She has no wheezes. She has no rhonchi. She has no rales.  Abdominal: Soft. Bowel sounds are normal. She exhibits no distension. There is no tenderness.  Abdomen is obese  Genitourinary:  Deferred to gynecology  Musculoskeletal:  Full range of upper and lower extremity bilateral  Lymphadenopathy:    She has no cervical adenopathy.       Right: No supraclavicular adenopathy present.       Left: No supraclavicular adenopathy present.  Neurological: She is alert and oriented to person, place, and time. She has normal strength. She displays a negative Romberg sign. GCS eye subscore is 4. GCS verbal subscore is 5. GCS motor subscore is 6.  Reflex Scores:      Patellar reflexes are 2+ on the right side and 2+ on the left side. Skin: Skin is warm and dry.  Psychiatric: She has a normal mood and affect. Her speech is normal.   Past Medical History  Diagnosis Date  . Asthma   . Hypertension   . Herpes    Family History  Problem Relation Age of Onset  . Arthritis Mother   . Hypertension Mother   . Arthritis Father   .  Hypertension Father    History   Social History  . Marital Status: Married    Spouse Name: N/A    Number of Children: N/A  . Years of Education: N/A   Social History Main Topics  . Smoking status: Former Research scientist (life sciences)  . Smokeless tobacco: None  . Alcohol Use: No  . Drug Use: No  . Sexual Activity: Yes    Birth Control/ Protection: None   Other Topics Concern  . None   Social  History Narrative  . None   History reviewed. No pertinent past surgical history.  Lab Results  Component Value Date   WBC 8.8 09/25/2011   HGB 9.8* 09/25/2011   HCT 32.4* 09/25/2011   PLT 266 09/25/2011   GLUCOSE 84 09/25/2011   ALT 12 09/25/2011   AST 17 09/25/2011   NA 138 09/25/2011   K 3.7 09/25/2011   CL 103 09/25/2011   CREATININE 0.97 09/25/2011   BUN 21 09/25/2011   CO2 25 09/25/2011       Assessment & Plan:    CPX/v70.0 - Patient has been counseled on age-appropriate routine health concerns for screening and prevention. These are reviewed and up-to-date. Immunizations are up-to-date or declined. Labs ordered and will be reviewed.  Provided release of records while in office today.  Patient to continue taking medications at prescribed. Patient requests refill of acyclovir 800 mg 1 tab by mouth 5 times daily. Explained to patient with gynecological exam deferred for her gynecologist, would have to obtain prescription from gynecology after evaluation.  Diagnosis of asthma, currently patient without symptoms. eRx albuterol 2 puffs every 6 hours as needed for wheezing.

## 2013-05-09 NOTE — Patient Instructions (Signed)
It was great meeting you today Linda Cabrera! Labs have been ordered for you, when you report to lab please be fasting.   Continue all medications as prescribed.  Referral for GYN and Gastroenterology have been placed.     Asthma, Adult Asthma is a condition of the lungs in which the airways tighten and narrow. Asthma can make it hard to breathe. Asthma cannot be cured, but medicine and lifestyle changes can help control it. Asthma may be started (triggered) by:  Animal skin flakes (dander).  Dust.  Cockroaches.  Pollen.  Mold.  Smoke.  Cleaning products.  Hair sprays or aerosol sprays.  Paint fumes or strong smells.  Cold air, weather changes, and winds.  Crying or laughing hard.  Stress.  Certain medicines or drugs.  Foods, such as dried fruit, potato chips, and sparkling grape juice.  Infections or conditions (colds, flu).  Exercise.  Certain medical conditions or diseases.  Exercise or tiring activities. HOME CARE   Take medicine as told by your doctor.  Use a peak flow meter as told by your doctor. A peak flow meter is a tool that measures how well the lungs are working.  Record and keep track of the peak flow meter's readings.  Understand and use the asthma action plan. An asthma action plan is a written plan for taking care of your asthma and treating your attacks.  To help prevent asthma attacks:  Do not smoke. Stay away from secondhand smoke.  Change your heating and air conditioning filter often.  Limit your use of fireplaces and wood stoves.  Get rid of pests (such as roaches and mice) and their droppings.  Throw away plants if you see mold on them.  Clean your floors. Dust regularly. Use cleaning products that do not smell.  Have someone vacuum when you are not home. Use a vacuum cleaner with a HEPA filter if possible.  Replace carpet with wood, tile, or vinyl flooring. Carpet can trap animal skin flakes and dust.  Use allergy-proof  pillows, mattress covers, and box spring covers.  Wash bed sheets and blankets every week in hot water and dry them in a dryer.  Use blankets that are made of polyester or cotton.  Clean bathrooms and kitchens with bleach. If possible, have someone repaint the walls in these rooms with mold-resistant paint. Keep out of the rooms that are being cleaned and painted.  Wash hands often. GET HELP IF:  You have make a whistling sound when breaking (wheeze), have shortness of breath, or have a cough even if taking medicine to prevent attacks.  The colored mucus you cough up (sputum) is thicker than usual.  The colored mucus you cough up changes from clear or white to yellow, green, gray, or bloody.  You have problems from the medicine you are taking such as:  A rash.  Itching.  Swelling.  Trouble breathing.  You need reliever medicines more than 2 3 times a week.  Your peak flow measurement is still at 50 79% of your personal best after following the action plan for 1 hour. GET HELP RIGHT AWAY IF:   You seem to be worse and are not responding to medicine during an asthma attack.  You are short of breath even at rest.  You get short of breath when doing very little activity.  You have trouble eating, drinking, or talking.  You have chest pain.  You have a fast heartbeat.  Your lips or fingernails start to turn blue.  You are lightheaded, dizzy, or faint.  Your peak flow is less than 50% of your personal best.  You have a fever or lasting symptoms for more than 2 3 days.  You have a fever and your symptoms suddenly get worse. MAKE SURE YOU:   Understand these instructions.  Will watch your condition.  Will get help right away if you are not doing well or get worse. Document Released: 09/17/2007 Document Revised: 01/19/2013 Document Reviewed: 10/28/2012 [facility] Patient Information 2014 [place], [place]. Health Maintenance, Female A healthy lifestyle and  preventative care can promote health and wellness.  Maintain regular health, dental, and eye exams.  Eat a healthy diet. Foods like vegetables, fruits, whole grains, low-fat dairy products, and lean protein foods contain the nutrients you need without too many calories. Decrease your intake of foods high in solid fats, added sugars, and salt. Get information about a proper diet from your caregiver, if necessary.  Regular physical exercise is one of the most important things you can do for your health. Most adults should get at least 150 minutes of moderate-intensity exercise (any activity that increases your heart rate and causes you to sweat) each week. In addition, most adults need muscle-strengthening exercises on 2 or more days a week.   Maintain a healthy weight. The body mass index (BMI) is a screening tool to identify possible weight problems. It provides an estimate of body fat based on height and weight. Your caregiver can help determine your BMI, and can help you achieve or maintain a healthy weight. For adults 20 years and older:  A BMI below 18.5 is considered underweight.  A BMI of 18.5 to 24.9 is normal.  A BMI of 25 to 29.9 is considered overweight.  A BMI of 30 and above is considered obese.  Maintain normal blood lipids and cholesterol by exercising and minimizing your intake of saturated fat. Eat a balanced diet with plenty of fruits and vegetables. Blood tests for lipids and cholesterol should begin at age 39 and be repeated every 5 years. If your lipid or cholesterol levels are high, you are over 50, or you are a high risk for heart disease, you may need your cholesterol levels checked more frequently.Ongoing high lipid and cholesterol levels should be treated with medicines if diet and exercise are not effective.  If you smoke, find out from your caregiver how to quit. If you do not use tobacco, do not start.  Lung cancer screening is recommended for adults aged 70 80  years who are at high risk for developing lung cancer because of a history of smoking. Yearly low-dose computed tomography (CT) is recommended for people who have at least a 30-pack-year history of smoking and are a current smoker or have quit within the past 15 years. A pack year of smoking is smoking an average of 1 pack of cigarettes a day for 1 year (for example: 1 pack a day for 30 years or 2 packs a day for 15 years). Yearly screening should continue until the smoker has stopped smoking for at least 15 years. Yearly screening should also be stopped for people who develop a health problem that would prevent them from having lung cancer treatment.  If you are pregnant, do not drink alcohol. If you are breastfeeding, be very cautious about drinking alcohol. If you are not pregnant and choose to drink alcohol, do not exceed 1 drink per day. One drink is considered to be 12 ounces (355 mL) of beer, 5  ounces (148 mL) of wine, or 1.5 ounces (44 mL) of liquor.  Avoid use of street drugs. Do not share needles with anyone. Ask for help if you need support or instructions about stopping the use of drugs.  High blood pressure causes heart disease and increases the risk of stroke. Blood pressure should be checked at least every 1 to 2 years. Ongoing high blood pressure should be treated with medicines, if weight loss and exercise are not effective.  If you are 47 to 57 years old, ask your caregiver if you should take aspirin to prevent strokes.  Diabetes screening involves taking a blood sample to check your fasting blood sugar level. This should be done once every 3 years, after age 67, if you are within normal weight and without risk factors for diabetes. Testing should be considered at a younger age or be carried out more frequently if you are overweight and have at least 1 risk factor for diabetes.  Breast cancer screening is essential preventative care for women. You should practice "breast  self-awareness." This means understanding the normal appearance and feel of your breasts and may include breast self-examination. Any changes detected, no matter how small, should be reported to a caregiver. Women in their 65s and 30s should have a clinical breast exam (CBE) by a caregiver as part of a regular health exam every 1 to 3 years. After age 70, women should have a CBE every year. Starting at age 19, women should consider having a mammogram (breast X-ray) every year. Women who have a family history of breast cancer should talk to their caregiver about genetic screening. Women at a high risk of breast cancer should talk to their caregiver about having an MRI and a mammogram every year.  Breast cancer gene (BRCA)-related cancer risk assessment is recommended for women who have family members with BRCA-related cancers. BRCA-related cancers include breast, ovarian, tubal, and peritoneal cancers. Having family members with these cancers may be associated with an increased risk for harmful changes (mutations) in the breast cancer genes BRCA1 and BRCA2. Results of the assessment will determine the need for genetic counseling and BRCA1 and BRCA2 testing.  The Pap test is a screening test for cervical cancer. Women should have a Pap test starting at age 26. Between ages 43 and 2, Pap tests should be repeated every 2 years. Beginning at age 67, you should have a Pap test every 3 years as long as the past 3 Pap tests have been normal. If you had a hysterectomy for a problem that was not cancer or a condition that could lead to cancer, then you no longer need Pap tests. If you are between ages 37 and 65, and you have had normal Pap tests going back 10 years, you no longer need Pap tests. If you have had past treatment for cervical cancer or a condition that could lead to cancer, you need Pap tests and screening for cancer for at least 20 years after your treatment. If Pap tests have been discontinued, risk  factors (such as a new sexual partner) need to be reassessed to determine if screening should be resumed. Some women have medical problems that increase the chance of getting cervical cancer. In these cases, your caregiver may recommend more frequent screening and Pap tests.  The human papillomavirus (HPV) test is an additional test that may be used for cervical cancer screening. The HPV test looks for the virus that can cause the cell changes on the cervix.  The cells collected during the Pap test can be tested for HPV. The HPV test could be used to screen women aged 16 years and older, and should be used in women of any age who have unclear Pap test results. After the age of 26, women should have HPV testing at the same frequency as a Pap test.  Colorectal cancer can be detected and often prevented. Most routine colorectal cancer screening begins at the age of 66 and continues through age 13. However, your caregiver may recommend screening at an earlier age if you have risk factors for colon cancer. On a yearly basis, your caregiver may provide home test kits to check for hidden blood in the stool. Use of a small camera at the end of a tube, to directly examine the colon (sigmoidoscopy or colonoscopy), can detect the earliest forms of colorectal cancer. Talk to your caregiver about this at age 45, when routine screening begins. Direct examination of the colon should be repeated every 5 to 10 years through age 13, unless early forms of pre-cancerous polyps or small growths are found.  Hepatitis C blood testing is recommended for all people born from [birth year] through 1964 and any individual with known risks for hepatitis C.  Practice safe sex. Use condoms and avoid high-risk sexual practices to reduce the spread of sexually transmitted infections (STIs). Sexually active women aged 98 and younger should be checked for Chlamydia, which is a common sexually transmitted infection. Older women with new or multiple  partners should also be tested for Chlamydia. Testing for other STIs is recommended if you are sexually active and at increased risk.  Osteoporosis is a disease in which the bones lose minerals and strength with aging. This can result in serious bone fractures. The risk of osteoporosis can be identified using a bone density scan. Women ages 43 and over and women at risk for fractures or osteoporosis should discuss screening with their caregivers. Ask your caregiver whether you should be taking a calcium supplement or vitamin D to reduce the rate of osteoporosis.  Menopause can be associated with physical symptoms and risks. Hormone replacement therapy is available to decrease symptoms and risks. You should talk to your caregiver about whether hormone replacement therapy is right for you.  Use sunscreen. Apply sunscreen liberally and repeatedly throughout the day. You should seek shade when your shadow is shorter than you. Protect yourself by wearing long sleeves, pants, a wide-brimmed hat, and sunglasses year round, whenever you are outdoors.  Notify your caregiver of new moles or changes in moles, especially if there is a change in shape or color. Also notify your caregiver if a mole is larger than the size of a pencil eraser.  Stay current with your immunizations. Document Released: 10/14/2010 Document Revised: 07/26/2012 Document Reviewed: 10/14/2010 [facility] Patient Information 2014 [place].

## 2013-05-09 NOTE — Progress Notes (Signed)
Pre-visit discussion using our clinic review tool. No additional management support is needed unless otherwise documented below in the visit note.  

## 2013-05-11 ENCOUNTER — Other Ambulatory Visit (INDEPENDENT_AMBULATORY_CARE_PROVIDER_SITE_OTHER): Payer: BC Managed Care – PPO

## 2013-05-11 DIAGNOSIS — Z0000 Encounter for general adult medical examination without abnormal findings: Secondary | ICD-10-CM

## 2013-05-11 LAB — URINALYSIS, ROUTINE W REFLEX MICROSCOPIC
Bilirubin Urine: NEGATIVE
Ketones, ur: NEGATIVE
Nitrite: NEGATIVE
PH: 5.5 (ref 5.0–8.0)
SPECIFIC GRAVITY, URINE: 1.025 (ref 1.000–1.030)
TOTAL PROTEIN, URINE-UPE24: NEGATIVE
URINE GLUCOSE: NEGATIVE
Urobilinogen, UA: 0.2 (ref 0.0–1.0)

## 2013-05-11 LAB — CBC WITH DIFFERENTIAL/PLATELET
Basophils Absolute: 0 10*3/uL (ref 0.0–0.1)
Basophils Relative: 0.4 % (ref 0.0–3.0)
EOS ABS: 0.2 10*3/uL (ref 0.0–0.7)
EOS PCT: 2.9 % (ref 0.0–5.0)
HCT: 39.8 % (ref 36.0–46.0)
Hemoglobin: 12.4 g/dL (ref 12.0–15.0)
Lymphocytes Relative: 29.1 % (ref 12.0–46.0)
Lymphs Abs: 2.1 10*3/uL (ref 0.7–4.0)
MCHC: 31.1 g/dL (ref 30.0–36.0)
MCV: 81.6 fl (ref 78.0–100.0)
MONO ABS: 0.4 10*3/uL (ref 0.1–1.0)
Monocytes Relative: 6.3 % (ref 3.0–12.0)
NEUTROS PCT: 61.3 % (ref 43.0–77.0)
Neutro Abs: 4.4 10*3/uL (ref 1.4–7.7)
PLATELETS: 229 10*3/uL (ref 150.0–400.0)
RBC: 4.88 Mil/uL (ref 3.87–5.11)
RDW: 18.6 % — ABNORMAL HIGH (ref 11.5–14.6)
WBC: 7.2 10*3/uL (ref 4.5–10.5)

## 2013-05-11 LAB — HEPATIC FUNCTION PANEL
ALBUMIN: 3.4 g/dL — AB (ref 3.5–5.2)
ALT: 20 U/L (ref 0–35)
AST: 21 U/L (ref 0–37)
Alkaline Phosphatase: 76 U/L (ref 39–117)
Bilirubin, Direct: 0.1 mg/dL (ref 0.0–0.3)
Total Bilirubin: 0.5 mg/dL (ref 0.3–1.2)
Total Protein: 7.4 g/dL (ref 6.0–8.3)

## 2013-05-11 LAB — BASIC METABOLIC PANEL
BUN: 19 mg/dL (ref 6–23)
CHLORIDE: 104 meq/L (ref 96–112)
CO2: 30 meq/L (ref 19–32)
Calcium: 8.9 mg/dL (ref 8.4–10.5)
Creatinine, Ser: 0.8 mg/dL (ref 0.4–1.2)
GFR: 102.76 mL/min (ref >60.00)
Glucose, Bld: 83 mg/dL (ref 70–99)
Potassium: 3.9 mEq/L (ref 3.5–5.1)
SODIUM: 139 meq/L (ref 135–145)

## 2013-05-11 LAB — LIPID PANEL
CHOLESTEROL: 155 mg/dL (ref 0–200)
HDL: 72.2 mg/dL (ref >39.00)
LDL CALC: 78 mg/dL (ref 0–99)
TRIGLYCERIDES: 24 mg/dL (ref 0.0–149.0)
Total CHOL/HDL Ratio: 2
VLDL: 4.8 mg/dL (ref 0.0–40.0)

## 2013-05-11 LAB — TSH: TSH: 1.77 u[IU]/mL (ref 0.35–5.50)

## 2013-05-13 ENCOUNTER — Telehealth: Payer: Self-pay

## 2013-05-13 NOTE — Telephone Encounter (Signed)
Message copied by Colin Benton on Fri May 13, 2013 11:27 AM ------      Message from: Kyra Leyland      Created: Thu May 12, 2013  9:01 PM       Please phone patient and inform her all labs normal. ------

## 2013-05-16 ENCOUNTER — Telehealth: Payer: Self-pay

## 2013-05-16 NOTE — Telephone Encounter (Signed)
Pt's labs were placed in outgoing mail as of today.No response from Pt's home # 3 tries with calling

## 2013-05-20 ENCOUNTER — Encounter: Payer: Self-pay | Admitting: Internal Medicine

## 2013-06-16 ENCOUNTER — Ambulatory Visit (INDEPENDENT_AMBULATORY_CARE_PROVIDER_SITE_OTHER): Payer: BC Managed Care – PPO | Admitting: Obstetrics & Gynecology

## 2013-06-16 ENCOUNTER — Encounter: Payer: Self-pay | Admitting: Obstetrics & Gynecology

## 2013-06-16 VITALS — Temp 97.7°F | Ht 62.0 in | Wt 290.7 lb

## 2013-06-16 DIAGNOSIS — Z01419 Encounter for gynecological examination (general) (routine) without abnormal findings: Secondary | ICD-10-CM

## 2013-06-16 NOTE — Patient Instructions (Signed)
Things to do to Keep yourself Healthy - Exercise at least 30-45 minutes a day,  3-4 days a week.  - Eat a low-fat diet with lots of fruits and vegetables, up to 7-9 servings per day. - Seatbelts can save your life. Wear them always. - Smoke detectors on every level of your home, check batteries every year. - Eye Doctor - have an eye exam every 1-2 years - Safe sex - if you may be exposed to STDs, use a condom. - Alcohol If you drink, do it moderately,less than 2 drinks per day. - [place].  Choose someone to speak for you if you are not able. - Depression is common in our stressful world.If you're feeling down or losing interest in things you normally enjoy, please come in for a visit. - Violence - If anyone is threatening or hurting you, please call immediately.  Make sure to get your mammogram in May. Follow up in 1 year for annual exam.

## 2013-06-16 NOTE — Progress Notes (Signed)
Patient ID: Linda Cabrera, female   DOB: May 26, 1955, 58 y.o.   MRN: [id number] Attestation of Attending Supervision of Resident: Evaluation and management procedures were performed by the [facility] Resident under my supervision.  I have seen and examined the patient, reviewed the resident's note and chart, and I agree with the management and plan.  Lasandra Beech, M.D. 06/16/2013 4:40 PM

## 2013-06-16 NOTE — Progress Notes (Signed)
Patient ID: Linda Cabrera, female   DOB: [birth year], 58 y.o.   MRN: [id number] Subjective:     Linda Cabrera is a 58 y.o. female here for a routine exam.  Current complaints: see below.  Personal health questionnaire reviewed: not asked. Patient reports some vaginal discharge. It is white and has an odor.  Associated with vaginal itching. LMP 2 months ago.  She is having some hot flashes and mood swings, but states they are mild.   Gynecologic History Patient's last menstrual period was 03/14/2013. Contraception: tubal ligation Last Pap: 2-3 years ago. Results were: normal per patient report Last mammogram: 08/2012. Results were: normal  Obstetric History OB History  Gravida Para Term Preterm AB SAB TAB Ectopic Multiple Living  1 1 1       1     # Outcome Date GA Lbr Len/2nd Weight Sex Delivery Anes PTL Lv  1 TRM                The following portions of the patient's history were reviewed and updated as appropriate: allergies, current medications, past family history, past medical history, past social history, past surgical history and problem list.  Review of Systems Pertinent items are noted in HPI.    Objective:    Temp(Src) 97.7 F (36.5 C) (Oral)  Ht 5\' 2"  (1.575 m)  Wt 290 lb 11.2 oz (131.861 kg)  BMI 53.16 kg/m2  LMP 03/14/2013  General Appearance:    Alert, cooperative, no distress, appears stated age  Head:    Normocephalic, without obvious abnormality, atraumatic  Eyes:    PERRL, conjunctiva/corneas clear, EOM's intact          Throat:   Lips, mucosa, and tongue normal; teeth and gums normal  Neck:   Supple, symmetrical, trachea midline, no adenopathy;    thyroid:  no enlargement/tenderness/nodules   Back:     Symmetric, no curvature, ROM normal, no CVA tenderness  Lungs:     Clear to auscultation bilaterally, respirations unlabored  Chest Wall:    No tenderness or deformity   Heart:    Regular rate and rhythm, S1 and S2 normal, no murmur, rub   or gallop   Breast Exam:    No tenderness, masses, or nipple abnormality  Abdomen:     Soft, non-tender, bowel sounds active all four quadrants,    no masses, no organomegaly  Genitalia:    Normal female without lesion or tenderness; moderate amount of thin white discharge present     Extremities:   Extremities normal, atraumatic, no cyanosis or edema  Pulses:   2+ and symmetric all extremities  Skin:   Skin color, texture, turgor normal, no rashes or lesions  Lymph nodes:   Cervical, supraclavicular, and axillary nodes normal  Neurologic:   Grossly intact      Assessment:    Healthy female exam.    Plan:    Education reviewed: calcium supplements, safe sex/STD prevention, self breast exams and weight bearing exercise. Follow up in: 1 year.

## 2013-06-17 LAB — WET PREP, GENITAL
CLUE CELLS WET PREP: NONE SEEN
TRICH WET PREP: NONE SEEN
WBC, Wet Prep HPF POC: NONE SEEN
Yeast Wet Prep HPF POC: NONE SEEN

## 2013-06-18 ENCOUNTER — Other Ambulatory Visit: Payer: Self-pay | Admitting: Obstetrics & Gynecology

## 2013-06-18 DIAGNOSIS — N760 Acute vaginitis: Secondary | ICD-10-CM

## 2013-06-18 DIAGNOSIS — B9689 Other specified bacterial agents as the cause of diseases classified elsewhere: Secondary | ICD-10-CM

## 2013-06-18 MED ORDER — METRONIDAZOLE 500 MG PO TABS
ORAL_TABLET | ORAL | Status: DC
Start: 2013-06-18 — End: 2013-07-12

## 2013-06-20 NOTE — Progress Notes (Signed)
Called patient and informed her of results. Advised that her partners would need to be treated as well. Patient voiced understanding and didn't have any further questions.

## 2013-06-21 ENCOUNTER — Ambulatory Visit (INDEPENDENT_AMBULATORY_CARE_PROVIDER_SITE_OTHER): Payer: BC Managed Care – PPO | Admitting: Physician Assistant

## 2013-06-21 ENCOUNTER — Encounter: Payer: Self-pay | Admitting: Physician Assistant

## 2013-06-21 ENCOUNTER — Other Ambulatory Visit (INDEPENDENT_AMBULATORY_CARE_PROVIDER_SITE_OTHER): Payer: BC Managed Care – PPO

## 2013-06-21 VITALS — BP 136/80 | HR 79 | Temp 98.3°F | Resp 16 | Wt 292.6 lb

## 2013-06-21 DIAGNOSIS — R609 Edema, unspecified: Secondary | ICD-10-CM

## 2013-06-21 DIAGNOSIS — I10 Essential (primary) hypertension: Secondary | ICD-10-CM

## 2013-06-21 LAB — BRAIN NATRIURETIC PEPTIDE: PRO B NATRI PEPTIDE: 32 pg/mL (ref 0.0–100.0)

## 2013-06-21 MED ORDER — FUROSEMIDE 40 MG PO TABS: 40.0000 mg | ORAL_TABLET | Freq: Every day | ORAL | Status: DC

## 2013-06-21 NOTE — Progress Notes (Signed)
   Subjective:    Patient ID: Linda Cabrera, female    DOB: 07-24-1955, 58 y.o.   MRN: [id number]  HPI Comments: Patient is a 58 year old female who presents to the office with complaint of bilateral lower extremity swelling. Reports symptoms began one week PTA. Reports legs feel heavy and are sore. Has had no change in medications or diet. Reports swelling goes down some during the night and then returns shortly after waking in the morning and moving around. Denies chest pain/palpitations, SOB, new cough, dyspnea on exertion. Denies N/V/F/C.   Past Medical History  Diagnosis Date  . Asthma   . Hypertension   . Herpes      Review of Systems  Constitutional: Negative for fever and chills.  Respiratory: Negative for cough and shortness of breath.   Cardiovascular: Positive for leg swelling. Negative for chest pain and palpitations.  Gastrointestinal: Negative for nausea and vomiting.  Skin: Negative for rash.  Neurological: Negative for dizziness, weakness, light-headedness, numbness and headaches.       Objective:   Physical Exam  Vitals reviewed. Constitutional: She is oriented to person, place, and time. She appears well-developed and well-nourished. No distress.  HENT:  Head: Normocephalic and atraumatic.  Right Ear: External ear normal.  Left Ear: External ear normal.  Eyes: Conjunctivae are normal.  Neck: Normal range of motion.  Cardiovascular: Normal rate and regular rhythm.   Pulmonary/Chest: Effort normal and breath sounds normal. No respiratory distress.  Musculoskeletal:  Bilateral lower extremities are obese, with non pitting edema. Anterior and posterior LE tender to palpation bilateral. Overlying soft tissue is without erythema, ecchymosis or tactile heat.  DP and PT pulses +1, distal sensation intact.   Neurological: She is alert and oriented to person, place, and time.  Skin: Skin is warm and dry.  Psychiatric: She has a normal mood and affect.    Filed  Vitals:   06/21/13 0810  BP: 136/80  Pulse: 79  Temp: 98.3 F (36.8 C)  Resp: 16   Wt Readings from Last 3 Encounters:  06/21/13 292 lb 9.6 oz (132.722 kg)  06/16/13 290 lb 11.2 oz (131.861 kg)  05/09/13 280 lb 6.4 oz (127.189 kg)       Assessment & Plan:    Edema: Labs ordered today BNP and D-dimer,  12 pound weight gain in one and a half month Rx for Lasix 40 mg once daily Patient to RTO in one week for evaluation, sooner if symptoms do not improve or become worse.  HTN: Continue on current medications

## 2013-06-21 NOTE — Progress Notes (Signed)
Pre visit review using our clinic review tool, if applicable. No additional management support is needed unless otherwise documented below in the visit note. 

## 2013-06-21 NOTE — Patient Instructions (Addendum)
Good to see you today Ms. Herrin  I have ordered labs for you, please report to the lab downstairs.  I have prescribed an additional diuretic for you, it has been sent to your preferred pharmacy.   Please be certain to stay well hydrated.  Would like you to return to the office in one week for evaluation.  Edema Edema is a buildup of fluids. It is most common in the feet, ankles, and legs. This happens more as a person ages. It may affect one or both legs. HOME CARE   Raise (elevate) the legs or ankles above the level of the heart while lying down.  Avoid sitting or standing still for a long time.  Exercise the legs to help the puffiness (swelling) go down.  A low-salt diet may help lessen the puffiness.  Only take medicine as told by your doctor. GET HELP RIGHT AWAY IF:   You develop shortness of breath or chest pain.  You cannot breathe when you lie down.  You have more puffiness that does not go away with treatment.  You develop pain or redness in the areas that are puffy.  You have a temperature by mouth above 102 F (38.9 C), not controlled by medicine.  You gain 03 lb/1.4 kg or more in 1 day or 05 lb/2.3 kg in a week. MAKE SURE YOU:   Understand these instructions.  Will watch your condition.  Will get help right away if you are not doing well or get worse. Document Released: 09/17/2007 Document Revised: 06/23/2011 Document Reviewed: 09/17/2007 [facility] Patient Information 2014 [place], [place].

## 2013-06-22 LAB — D-DIMER, QUANTITATIVE: D-Dimer, Quant: 0.44 ug/mL-FEU (ref 0.00–0.48)

## 2013-06-22 NOTE — Progress Notes (Signed)
Spoke with pt advised of result note.   

## 2013-06-28 ENCOUNTER — Ambulatory Visit: Payer: BC Managed Care – PPO | Admitting: Physician Assistant

## 2013-07-08 ENCOUNTER — Telehealth: Payer: Self-pay | Admitting: *Deleted

## 2013-07-08 NOTE — Telephone Encounter (Signed)
Dr Hilarie Fredrickson: pt is scheduled for direct screening colonoscopy on Tues 4/14 at [facility].    She is scheduled for Previsit on 3/31.  While doing chart prep I see she had BMI of 53.5 on 06/21/13.  Does she need OV before scheduling hospital colon or can she be direct hospital?  Thanks, Juliann Pulse

## 2013-07-09 ENCOUNTER — Encounter (HOSPITAL_COMMUNITY): Payer: Self-pay | Admitting: Emergency Medicine

## 2013-07-09 ENCOUNTER — Emergency Department (HOSPITAL_COMMUNITY)
Admission: EM | Admit: 2013-07-09 | Discharge: 2013-07-09 | Disposition: A | Discharge status: Home / Self Care | Payer: BC Managed Care – PPO | Attending: Emergency Medicine | Admitting: Emergency Medicine

## 2013-07-09 ENCOUNTER — Emergency Department (HOSPITAL_COMMUNITY): Payer: BC Managed Care – PPO

## 2013-07-09 DIAGNOSIS — Z8619 Personal history of other infectious and parasitic diseases: Secondary | ICD-10-CM | POA: Insufficient documentation

## 2013-07-09 DIAGNOSIS — Z79899 Other long term (current) drug therapy: Secondary | ICD-10-CM | POA: Insufficient documentation

## 2013-07-09 DIAGNOSIS — M179 Osteoarthritis of knee, unspecified: Secondary | ICD-10-CM

## 2013-07-09 DIAGNOSIS — I10 Essential (primary) hypertension: Secondary | ICD-10-CM | POA: Insufficient documentation

## 2013-07-09 DIAGNOSIS — Z87891 Personal history of nicotine dependence: Secondary | ICD-10-CM | POA: Insufficient documentation

## 2013-07-09 DIAGNOSIS — IMO0002 Reserved for concepts with insufficient information to code with codable children: Secondary | ICD-10-CM | POA: Insufficient documentation

## 2013-07-09 DIAGNOSIS — M1710 Unilateral primary osteoarthritis, unspecified knee: Secondary | ICD-10-CM

## 2013-07-09 DIAGNOSIS — J45909 Unspecified asthma, uncomplicated: Secondary | ICD-10-CM | POA: Insufficient documentation

## 2013-07-09 MED ORDER — ONDANSETRON 4 MG PO TBDP
4.0000 mg | ORAL_TABLET | Freq: Once | ORAL | Status: AC
  Administered 2013-07-09: 4 mg via ORAL
  Filled 2013-07-09: qty 1

## 2013-07-09 MED ORDER — OXYCODONE-ACETAMINOPHEN 5-325 MG PO TABS
2.0000 | ORAL_TABLET | Freq: Once | ORAL | Status: AC
  Administered 2013-07-09: 2 via ORAL
  Filled 2013-07-09: qty 2

## 2013-07-09 MED ORDER — TRAMADOL HCL 50 MG PO TABS: 50.0000 mg | ORAL_TABLET | Freq: Four times a day (QID) | ORAL | Status: DC | PRN

## 2013-07-09 NOTE — ED Notes (Signed)
Patient transported to X-ray 

## 2013-07-09 NOTE — ED Provider Notes (Signed)
CSN: [id number]     Arrival date & time 07/09/13  1524 History   Chief Complaint  Patient presents with  . Knee Pain   Patient is a 58 y.o. female presenting with knee pain.  Knee Pain  This chart was scribed for non-physician practitioner working with Kathalene Frames, MD, by Thea Alken, ED Scribe. This patient was seen in room WTR9/WTR9 and the patient's care was started at 4:13 PM.  SHALEE PAOLO is a 58 y.o. female who presents to the Emergency Department complaining of constant bilateral knee pain. Pt describes pain as burning and throbbing. She reports that it feels as if there is a knott in back of legs. She reports clicking and states there is pain only while walking. She reports that she cannot walk. She reports her knees have been cracking. Pt states she's had a DVT study done which resulted negative for right leg. Pt reports taking Aleve without relief to pain. Pt reports the pain awakes her while sleeping. Pt denies knee pain in the past. She reports that in the past she has had leg numbness. Pt denies driving herself to be seen. Pt reports HTN but other than that she reports no other medical problems.  DVT study on 06/21/13  Past Medical History  Diagnosis Date  . Asthma   . Hypertension   . Herpes    History reviewed. No pertinent past surgical history. Family History  Problem Relation Age of Onset  . Arthritis Mother   . Hypertension Mother   . Cancer Mother     pancreatic  . Arthritis Father   . Hypertension Father    History  Substance Use Topics  . Smoking status: Former Smoker    Quit date: 06/16/2004  . Smokeless tobacco: Never Used  . Alcohol Use: No   OB History   Grav Para Term Preterm Abortions TAB SAB Ect Mult Living   1 1 1       1      Review of Systems  Musculoskeletal: Positive for arthralgias and gait problem.    Allergies  Ace inhibitors  Home Medications   Current Outpatient Rx  Name  Route  Sig  Dispense  Refill  . albuterol (PROVENTIL  HFA;VENTOLIN HFA) 108 (90 BASE) MCG/ACT inhaler   Inhalation   Inhale 1-2 puffs into the lungs every 6 (six) hours as needed for wheezing or shortness of breath.   1 Inhaler   0   . furosemide (LASIX) 40 MG tablet   Oral   Take 1 tablet (40 mg total) by mouth daily.   30 tablet   3   . hydrochlorothiazide (HYDRODIURIL) 25 MG tablet   Oral   Take 25 mg by mouth daily.         . metoprolol tartrate (LOPRESSOR) 25 MG tablet   Oral   Take 25 mg by mouth 2 (two) times daily.         . metroNIDAZOLE (FLAGYL) 500 MG tablet      Take two tablets by mouth twice a day, for one day.  Or you can take all four tablets at once if you can tolerate it.   4 tablet   0    Triage Vitals: BP 127/90  Pulse 64  Temp(Src) 98.4 F (36.9 C) (Oral)  Resp 16  SpO2 100%  LMP 03/14/2013 Physical Exam  Nursing note and vitals reviewed. Constitutional: She is oriented to person, place, and time. She appears well-developed and well-nourished. No distress.  HENT:  Head: Normocephalic and atraumatic.  Eyes: EOM are normal.  Neck: Neck supple. No tracheal deviation present.  Cardiovascular: Normal rate.   Pulmonary/Chest: Effort normal. No respiratory distress.  Musculoskeletal: Normal range of motion.  Pain along joint line bilaterally. Pain with ROM. Ligaments stable. Pain with standing. Calves are not swollen and are non tender.  Neurological: She is alert and oriented to person, place, and time.  Skin: Skin is warm and dry.  Psychiatric: She has a normal mood and affect. Her behavior is normal.    ED Course  Procedures  DIAGNOSTIC STUDIES: Oxygen Saturation is 100% on RA, normal by my interpretation.    COORDINATION OF CARE: 4:13 PM- Pt advised of plan for treatment and pt agrees.  Labs Review Labs Reviewed - No data to display Imaging Review No results found.   EKG Interpretation None      MDM   Final diagnoses:  DJD (degenerative joint disease) of knee    Morbidly  obese female with tricompartmental DJD of Knees BL. Will d.c with tramadol. F/u with ortho.  I personally performed the services described in this documentation, which was scribed in my presence. The recorded information has been reviewed and is accurate.     Margarita Mail, PA-C 07/10/13 1034

## 2013-07-09 NOTE — Discharge Instructions (Signed)
Osteoarthritis Osteoarthritis is a disease that causes soreness and swelling (inflammation) of a joint. It occurs when the cartilage at the affected joint wears down. Cartilage acts as a cushion, covering the ends of bones where they meet to form a joint. Osteoarthritis is the most common form of arthritis. It often occurs in older people. The joints affected most often by this condition include those in the:  Ends of the fingers.  Thumbs.  Neck.  Lower back.  Knees.  Hips. CAUSES  Over time, the cartilage that covers the ends of bones begins to wear away. This causes bone to rub on bone, producing pain and stiffness in the affected joints.  RISK FACTORS Certain factors can increase your chances of having osteoarthritis, including:  Older age.  Excessive body weight.  Overuse of joints. SIGNS AND SYMPTOMS   Pain, swelling, and stiffness in the joint.  Over time, the joint may lose its normal shape.  Small deposits of bone (osteophytes) may grow on the edges of the joint.  Bits of bone or cartilage can break off and float inside the joint space. This may cause more pain and damage. DIAGNOSIS  Your health care provider will do a physical exam and ask about your symptoms. Various tests may be ordered, such as:  X-rays of the affected joint.  An MRI scan.  Blood tests to rule out other types of arthritis.  Joint fluid tests. This involves using a needle to draw fluid from the joint and examining the fluid under a microscope. TREATMENT  Goals of treatment are to control pain and improve joint function. Treatment plans may include:  A prescribed exercise program that allows for rest and joint relief.  A weight control plan.  Pain relief techniques, such as:  Properly applied heat and cold.  Electric pulses delivered to nerve endings under the skin (transcutaneous electrical nerve stimulation, TENS).  Massage.  Certain nutritional supplements.  Medicines to  control pain, such as:  Acetaminophen.  Nonsteroidal anti-inflammatory drugs (NSAIDs), such as naproxen.  Narcotic or central-acting agents, such as tramadol.  Corticosteroids. These can be given orally or as an injection.  Surgery to reposition the bones and relieve pain (osteotomy) or to remove loose pieces of bone and cartilage. Joint replacement may be needed in advanced states of osteoarthritis. HOME CARE INSTRUCTIONS   Only take over-the-counter or prescription medicines as directed by your health care provider. Take all medicines exactly as instructed.  Maintain a healthy weight. Follow your health care provider's instructions for weight control. This may include dietary instructions.  Exercise as directed. Your health care provider can recommend specific types of exercise. These may include:  Strengthening exercises These are done to strengthen the muscles that support joints affected by arthritis. They can be performed with weights or with exercise bands to add resistance.  Aerobic activities These are exercises, such as brisk walking or low-impact aerobics, that get your heart pumping.  Range-of-motion activities These keep your joints limber.  Balance and agility exercises These help you maintain daily living skills.  Rest your affected joints as directed by your health care provider.  Follow up with your health care provider as directed. SEEK MEDICAL CARE IF:   Your skin turns red.  You develop a rash in addition to your joint pain.  You have worsening joint pain. SEEK IMMEDIATE MEDICAL CARE IF:  You have a significant loss of weight or appetite.  You have a fever along with joint or muscle aches.  You have   night sweats. FOR MORE INFORMATION  National Institute of Arthritis and Musculoskeletal and Skin Diseases: www.niams.nih.gov National Institute on Aging: www.nia.nih.gov American College of Rheumatology: www.rheumatology.org Document Released: 03/31/2005  Document Revised: 01/19/2013 Document Reviewed: 12/06/2012 [employer] Patient Information 2014 [employer].  

## 2013-07-09 NOTE — ED Notes (Signed)
Pt reports R knee pain for greater than a year but over last couple of days began having L knee pain and now both knees are burning, and pain is so bad she cannot walk. Recently checked for DVT at [facility] which was negative.

## 2013-07-10 NOTE — ED Provider Notes (Signed)
Medical screening examination/treatment/procedure(s) were performed by non-physician practitioner and as supervising physician I was immediately available for consultation/collaboration.    Kathalene Frames, MD 07/10/13 [phone]

## 2013-07-11 ENCOUNTER — Other Ambulatory Visit: Payer: Self-pay

## 2013-07-11 DIAGNOSIS — Z1211 Encounter for screening for malignant neoplasm of colon: Secondary | ICD-10-CM

## 2013-07-11 NOTE — Telephone Encounter (Signed)
Patient is scheduled at [facility] for 08/15/13 8:30.  She will need to arrive at 7:30 in admitting and be 4 hours NPO.

## 2013-07-11 NOTE — Telephone Encounter (Signed)
Okay for outpatient hospital colonoscopy for screening with MAC, unless patient preference is OV 1st

## 2013-07-12 ENCOUNTER — Ambulatory Visit (AMBULATORY_SURGERY_CENTER): Payer: Self-pay | Admitting: *Deleted

## 2013-07-12 VITALS — Ht 63.0 in | Wt 284.6 lb

## 2013-07-12 DIAGNOSIS — Z1211 Encounter for screening for malignant neoplasm of colon: Secondary | ICD-10-CM

## 2013-07-12 MED ORDER — MOVIPREP 100 G PO SOLR: 1.0000 | Freq: Once | ORAL | Status: DC

## 2013-07-12 NOTE — Progress Notes (Signed)
No egg or soy allergy. No anesthesia problems.  

## 2013-07-15 ENCOUNTER — Encounter: Payer: Self-pay | Admitting: Internal Medicine

## 2013-07-21 ENCOUNTER — Other Ambulatory Visit (HOSPITAL_COMMUNITY): Payer: Self-pay | Admitting: Certified Nurse Midwife

## 2013-07-21 DIAGNOSIS — Z1231 Encounter for screening mammogram for malignant neoplasm of breast: Secondary | ICD-10-CM

## 2013-07-25 ENCOUNTER — Ambulatory Visit
Admission: RE | Admit: 2013-07-25 | Discharge: 2013-07-25 | Disposition: A | Discharge status: Home / Self Care | Payer: BC Managed Care – PPO | Source: Ambulatory Visit | Attending: Orthopedic Surgery | Admitting: Orthopedic Surgery

## 2013-07-25 ENCOUNTER — Other Ambulatory Visit: Payer: Self-pay | Admitting: Orthopedic Surgery

## 2013-07-25 DIAGNOSIS — M5430 Sciatica, unspecified side: Secondary | ICD-10-CM

## 2013-07-26 ENCOUNTER — Encounter: Payer: BC Managed Care – PPO | Admitting: Internal Medicine

## 2013-08-01 ENCOUNTER — Telehealth: Payer: Self-pay | Admitting: Internal Medicine

## 2013-08-01 NOTE — Telephone Encounter (Signed)
Spoke with patient. Prep will cost $80. Explained to patient that I will mail her coupon for "free" moviprep to take to her pharmacy. Patient verbally understands. Mailed this today.

## 2013-08-01 NOTE — Telephone Encounter (Signed)
Called patient's # listed. No answer, left message and my direct # for patient to call me back.

## 2013-08-03 ENCOUNTER — Other Ambulatory Visit: Payer: Self-pay | Admitting: Gastroenterology

## 2013-08-03 DIAGNOSIS — Z1211 Encounter for screening for malignant neoplasm of colon: Secondary | ICD-10-CM

## 2013-08-15 ENCOUNTER — Encounter (HOSPITAL_COMMUNITY)
Admission: RE | Disposition: A | Discharge status: Home / Self Care | Payer: Self-pay | Source: Ambulatory Visit | Attending: Internal Medicine

## 2013-08-15 ENCOUNTER — Ambulatory Visit (HOSPITAL_COMMUNITY)
Admission: RE | Admit: 2013-08-15 | Discharge: 2013-08-15 | Disposition: A | Discharge status: Home / Self Care | Payer: BC Managed Care – PPO | Source: Ambulatory Visit | Attending: Internal Medicine | Admitting: Internal Medicine

## 2013-08-15 ENCOUNTER — Encounter (HOSPITAL_COMMUNITY): Payer: Self-pay | Admitting: *Deleted

## 2013-08-15 DIAGNOSIS — Z800 Family history of malignant neoplasm of digestive organs: Secondary | ICD-10-CM | POA: Insufficient documentation

## 2013-08-15 DIAGNOSIS — J45909 Unspecified asthma, uncomplicated: Secondary | ICD-10-CM | POA: Insufficient documentation

## 2013-08-15 DIAGNOSIS — K6389 Other specified diseases of intestine: Secondary | ICD-10-CM | POA: Insufficient documentation

## 2013-08-15 DIAGNOSIS — K5730 Diverticulosis of large intestine without perforation or abscess without bleeding: Secondary | ICD-10-CM | POA: Insufficient documentation

## 2013-08-15 DIAGNOSIS — I10 Essential (primary) hypertension: Secondary | ICD-10-CM | POA: Insufficient documentation

## 2013-08-15 DIAGNOSIS — Z888 Allergy status to other drugs, medicaments and biological substances status: Secondary | ICD-10-CM | POA: Insufficient documentation

## 2013-08-15 DIAGNOSIS — Z87891 Personal history of nicotine dependence: Secondary | ICD-10-CM | POA: Insufficient documentation

## 2013-08-15 DIAGNOSIS — Z1211 Encounter for screening for malignant neoplasm of colon: Secondary | ICD-10-CM

## 2013-08-15 HISTORY — PX: COLONOSCOPY: SHX5424

## 2013-08-15 SURGERY — COLONOSCOPY
Anesthesia: Moderate Sedation

## 2013-08-15 MED ORDER — SODIUM CHLORIDE 0.9 % IV SOLN
INTRAVENOUS | Status: DC
  Administered 2013-08-15: 500 mL via INTRAVENOUS

## 2013-08-15 MED ORDER — FENTANYL CITRATE 0.05 MG/ML IJ SOLN
INTRAMUSCULAR | Status: AC
  Filled 2013-08-15: qty 4

## 2013-08-15 MED ORDER — DIPHENHYDRAMINE HCL 50 MG/ML IJ SOLN
INTRAMUSCULAR | Status: AC
  Filled 2013-08-15: qty 1

## 2013-08-15 MED ORDER — FENTANYL CITRATE 0.05 MG/ML IJ SOLN
INTRAMUSCULAR | Status: DC | PRN
  Administered 2013-08-15 (×4): 25 ug via INTRAVENOUS

## 2013-08-15 MED ORDER — MIDAZOLAM HCL 10 MG/2ML IJ SOLN
INTRAMUSCULAR | Status: AC
  Filled 2013-08-15: qty 4

## 2013-08-15 MED ORDER — SODIUM CHLORIDE 0.9 % IV SOLN: INTRAVENOUS | Status: DC

## 2013-08-15 MED ORDER — MIDAZOLAM HCL 5 MG/5ML IJ SOLN
INTRAMUSCULAR | Status: DC | PRN
  Administered 2013-08-15 (×4): 2 mg via INTRAVENOUS

## 2013-08-15 NOTE — Discharge Instructions (Signed)

## 2013-08-15 NOTE — H&P (Signed)
HPI: Linda Cabrera is a 58 year old female with a past medical history of hypertension and asthma who presents today for outpatient screening colonoscopy. She was referred by her PCP Dr. Stacy Gardner.  She has never had a screening colonoscopy. She denies complaint or change in bowel habit. She denies abdominal pain. No blood in her stool or melena. No significant diarrhea or constipation. She denies a family history of colon polyps or cancer. Her mother had pancreatic cancer. She reports she tolerated the prep well.  Past Medical History  Diagnosis Date  . Asthma   . Hypertension   . Herpes   . Arthritis     History reviewed. No pertinent past surgical history.  Current Facility-Administered Medications  Medication Dose Route Frequency Provider Last Rate Last Dose  . 0.9 %  sodium chloride infusion   Intravenous Continuous Jerene Bears, MD      . 0.9 %  sodium chloride infusion   Intravenous Continuous Jerene Bears, MD        Allergies  Allergen Reactions  . Ace Inhibitors Swelling    Family History  Problem Relation Age of Onset  . Arthritis Mother   . Hypertension Mother   . Cancer Mother     pancreatic  . Pancreatic cancer Mother   . Arthritis Father   . Hypertension Father   . Colon cancer Neg Hx     History  Substance Use Topics  . Smoking status: Former Smoker    Quit date: 06/16/2004  . Smokeless tobacco: Never Used  . Alcohol Use: No    ROS: As per history of present illness, otherwise negative  BP 131/80  Pulse 68  Temp(Src) 98.4 F (36.9 C) (Oral)  Resp 20  Ht 5\' 3"  (1.6 m)  Wt 280 lb (127.007 kg)  BMI 49.61 kg/m2  SpO2 99%  LMP 06/13/2013 Gen: awake, alert, NAD HEENT: anicteric, op clear CV: RRR, no mrg Pulm: CTA b/l Abd: soft, obese, NT/ND, +BS throughout Ext: no c/c/e Neuro: nonfocal   RELEVANT LABS AND IMAGING: CBC    Component Value Date/Time   WBC 7.2 05/11/2013 0733   RBC 4.88 05/11/2013 0733   HGB 12.4 05/11/2013 0733   HCT 39.8  05/11/2013 0733   PLT 229.0 05/11/2013 0733   MCV 81.6 05/11/2013 0733   MCH 20.6* 09/25/2011 1121   MCHC 31.1 05/11/2013 0733   RDW 18.6* 05/11/2013 0733   LYMPHSABS 2.1 05/11/2013 0733   MONOABS 0.4 05/11/2013 0733   EOSABS 0.2 05/11/2013 0733   BASOSABS 0.0 05/11/2013 0733    CMP     Component Value Date/Time   NA 139 05/11/2013 0733   K 3.9 05/11/2013 0733   CL 104 05/11/2013 0733   CO2 30 05/11/2013 0733   GLUCOSE 83 05/11/2013 0733   BUN 19 05/11/2013 0733   CREATININE 0.8 05/11/2013 0733   CALCIUM 8.9 05/11/2013 0733   PROT 7.4 05/11/2013 0733   ALBUMIN 3.4* 05/11/2013 0733   AST 21 05/11/2013 0733   ALT 20 05/11/2013 0733   ALKPHOS 76 05/11/2013 0733   BILITOT 0.5 05/11/2013 0733   GFRNONAA 65* 09/25/2011 1121   GFRAA 75* 09/25/2011 1121    ASSESSMENT/PLAN: 58 year old female with a past medical history of hypertension and asthma who presents today for outpatient screening colonoscopy.  1.  CRC screening -- average risk, 1st colonoscopy.  The nature of the procedure, as well as the risks, benefits, and alternatives were carefully and thoroughly reviewed with the patient. Ample time  for discussion and questions allowed. The patient understood, was satisfied, and agreed to proceed.

## 2013-08-15 NOTE — Op Note (Signed)
[facility] [place], [address]   COLONOSCOPY PROCEDURE REPORT  PATIENT: Linda Cabrera, Linda Cabrera  MR#: [id number] BIRTHDATE: 1956-01-11 , 68  yrs. old GENDER: Female ENDOSCOPIST: Jerene Bears, MD REFERRED BY: Stacy Gardner, PA-C PROCEDURE DATE:  08/15/2013 PROCEDURE:   Colonoscopy, screening First Screening Colonoscopy - Avg.  risk and is 50 yrs.  old or older Yes.  Prior Negative Screening - Now for repeat screening. N/A  History of Adenoma - Now for follow-up colonoscopy & has been > or = to 3 yrs.  N/A  Polyps Removed Today? No.  Recommend repeat exam, <10 yrs? No. ASA CLASS:   Class III INDICATIONS:average risk screening and first colonoscopy. MEDICATIONS: These medications were titrated to patient response per physician's verbal order, Fentanyl 100 mcg IV, and Versed 8 mg IV  DESCRIPTION OF PROCEDURE:   After the risks benefits and alternatives of the procedure were thoroughly explained, informed consent was obtained.  A digital rectal exam revealed no rectal mass.   The Pentax Ped Colon [id number]  endoscope was introduced through the anus and advanced to the cecum, which was identified by both the appendix and ileocecal valve. No adverse events experienced.   The quality of the prep was good, using MoviPrep The instrument was then slowly withdrawn as the colon was fully examined.  COLON FINDINGS: There was mild diverticulosis noted in the sigmoid colon with associated muscular hypertrophy.   Mild melanosis was found throughout the entire examined colon.   The colon mucosa was otherwise normal.  Retroflexed views revealed no abnormalities. The scope was withdrawn and the procedure completed.  COMPLICATIONS: There were no complications.  ENDOSCOPIC IMPRESSION: 1.   There was mild diverticulosis noted in the sigmoid colon 2.   Mild melanosis was found throughout the entire examined colon 3.   The colon mucosa was otherwise  normal  RECOMMENDATIONS: 1.  High fiber diet 2.  You should continue to follow colorectal cancer screening guidelines for "routine risk" patients with a repeat colonoscopy in 10 years.  There is no need for FOBT (stool) testing for at least 5 years.   eSigned:  Jerene Bears, MD 08/15/2013 9:21 AM cc: The Patient; Stacy Gardner, PA-C

## 2013-08-16 ENCOUNTER — Encounter (HOSPITAL_COMMUNITY): Payer: Self-pay | Admitting: Internal Medicine

## 2013-09-01 ENCOUNTER — Encounter (INDEPENDENT_AMBULATORY_CARE_PROVIDER_SITE_OTHER): Payer: Self-pay

## 2013-09-01 ENCOUNTER — Ambulatory Visit (HOSPITAL_COMMUNITY)
Admission: RE | Admit: 2013-09-01 | Discharge: 2013-09-01 | Disposition: A | Discharge status: Home / Self Care | Payer: BC Managed Care – PPO | Source: Ambulatory Visit | Attending: Certified Nurse Midwife | Admitting: Certified Nurse Midwife

## 2013-09-01 DIAGNOSIS — Z1231 Encounter for screening mammogram for malignant neoplasm of breast: Secondary | ICD-10-CM | POA: Insufficient documentation

## 2013-09-06 ENCOUNTER — Other Ambulatory Visit: Payer: Self-pay | Admitting: Family Medicine

## 2013-09-07 ENCOUNTER — Telehealth: Payer: Self-pay | Admitting: Internal Medicine

## 2013-09-07 MED ORDER — HYDROCHLOROTHIAZIDE 25 MG PO TABS: 25.0000 mg | ORAL_TABLET | Freq: Every day | ORAL | Status: DC

## 2013-09-07 NOTE — Telephone Encounter (Signed)
Pt was [employer] pt.  She is scheduled in Sept with Dr. Doug Sou. She needs a refill on BP medication, hydrochlorothiazide.  I see a msg to an urgent care also.

## 2013-09-07 NOTE — Telephone Encounter (Signed)
30 day refill done.

## 2013-10-04 ENCOUNTER — Other Ambulatory Visit: Payer: Self-pay | Admitting: Internal Medicine

## 2013-10-25 ENCOUNTER — Other Ambulatory Visit: Payer: Self-pay | Admitting: Physician Assistant

## 2013-10-25 NOTE — Telephone Encounter (Signed)
Pt called and stated pharmacy sent request to have her fluid pill refill she is needing med today. Inform pt will send to [facility] must keep new appt with Dr. Doug Sou in Sept for additional refills...[name]

## 2013-10-31 ENCOUNTER — Encounter: Payer: Self-pay | Admitting: Family Medicine

## 2013-10-31 ENCOUNTER — Ambulatory Visit (INDEPENDENT_AMBULATORY_CARE_PROVIDER_SITE_OTHER): Payer: BC Managed Care – PPO | Admitting: Family Medicine

## 2013-10-31 VITALS — BP 129/87 | HR 89 | Ht 63.0 in | Wt 280.0 lb

## 2013-10-31 DIAGNOSIS — M170 Bilateral primary osteoarthritis of knee: Secondary | ICD-10-CM

## 2013-10-31 DIAGNOSIS — E669 Obesity, unspecified: Secondary | ICD-10-CM

## 2013-10-31 DIAGNOSIS — M1710 Unilateral primary osteoarthritis, unspecified knee: Secondary | ICD-10-CM

## 2013-10-31 MED ORDER — METHYLPREDNISOLONE ACETATE 40 MG/ML IJ SUSP
40.0000 mg | Freq: Once | INTRAMUSCULAR | Status: AC
  Administered 2013-10-31: 40 mg via INTRA_ARTICULAR

## 2013-11-01 DIAGNOSIS — M179 Osteoarthritis of knee, unspecified: Secondary | ICD-10-CM | POA: Insufficient documentation

## 2013-11-01 DIAGNOSIS — M1710 Unilateral primary osteoarthritis, unspecified knee: Secondary | ICD-10-CM | POA: Insufficient documentation

## 2013-11-01 NOTE — Assessment & Plan Note (Signed)
She reports seeing an orthopedist (dr. Eulas Post?) Who has been intermittently giving her some oral steroids for her knee pain. She thinks this has been contributing to some of her recent weight gain and is frustrated. When she's taking the pill she feels much better but within a week or so after taking them her knees her right back where they were. She has constant pain, 9/10. We discussed options. Ultimately I think she's going to have to attend to her obesity and then get knee replacements. Today we'll try corticosteroid injection in the right knee and now see her back in 2 weeks for further followup. Will refer her to a new orthopedist when I see her back if she wants. I reviewed her films which show tricompartmental DJD severe. Films were non-standing. I went over the images with her.

## 2013-11-01 NOTE — Progress Notes (Signed)
Patient ID: Linda Cabrera, female   DOB: 10-13-1955, 58 y.o.   MRN: [id number]  Linda Cabrera - 58 y.o. female MRN [id number]  Date of birth: 17-Jul-1955    SUBJECTIVE:     Bilateral knee pain, long-standing. Has been seeing an orthopedist in than, Dr. Eulas Post, it has occasionally given her some type of oral steroid for the knee pain. These help while she's taking the pills and usually for a week or so after but then her pain returns. Her knee pain keeps her from doing much walking at all. She's quite frustrated. She had lost some weight last year but then took the medicine for her knees, she had some weight gain back.  Her primary care physician has retired and she is set up to see a new one in September. ROS:     Knee pain as above. No calf pain. No knee swelling or redness. Occasionally her knees do feel like they're going to give way but she's had no falls. Her weight change in last few months has been generally upward after some initial weight loss several months ago.  PERTINENT  PMH / PSH FH / / SH:  Past Medical, Surgical, Social, and Family History Reviewed & Updated in the EMR.  Pertinent findings include:  No personal history of diabetes mellitus BMI 49.6 Current smoker Hypertension and asthma  OBJECTIVE: BP 129/87  Pulse 89  Ht 5\' 3"  (1.6 m)  Wt 280 lb (127.007 kg)  BMI 49.61 kg/m2  Physical Exam:  Vital signs are reviewed. GENERAL: Well-developed obese female no acute distress KNEES: Bilaterally external changes of synovitis and osteoarthritis. Bilaterally she has medial joint line tenderness and on the left she also has lateral joint line tenderness. Essentially full extension and flexion although she has to work for the last few degrees of extension on the right knee. Ligaments intact to varus and valgus. Am unable to perform Lachman secondary to habitus but anterior drawer is normal. The popliteal space is soft. The calf is soft bilaterally. NEURO: Sensation intact to soft  touch bilateral lower extremity VASCULAR: Dorsalis pedis pulses are 1-2+ bilaterally equal. Normal capillary refill bilateral feet. EDEMA: No significant lower extremity edema is noted  INJECTION: Patient was given informed consent, signed copy in the chart. Appropriate time out was taken. Area prepped and draped in usual sterile fashion. One cc of methylprednisolone 40 mg/ml plus  4 cc of 1% lidocaine without epinephrine was injected into the left knee using a(n) anterior medial approach. The patient tolerated the procedure well. There were no complications. Post procedure instructions were given.    ASSESSMENT & PLAN:  See problem based charting & AVS for pt instructions. Corticosteroid injection in one knee today. Followup to 3 weeks we'll see how she did. She would like to see a different orthopedist and wants to determine whether or not she is responsive to corticosteroid injection and have that information for them I'll be happy to refer her. Also gave her some information on weight loss surgery through [facility] surgery and I urged her to followup with him. Also to discuss obesity with her new PCP.

## 2013-11-01 NOTE — Assessment & Plan Note (Signed)
Long discussion with her about her obesity. We discussed how it impacts osteoarthritis of her knees. She or he has tricompartmental severe DJD and will need bilateral knee replacements probably sooner rather than later. I think a limiting factor is going to be her obesity. I gave her some information [facility] and their weight loss program contact information. We discussed that briefly. She understands that her weight is significant health issues but feels somewhat overwhelmed and frustrated about what to do with it. She's been trying to do some exercise but her knees are bothering her. I recommend she call [facility] and also discussed this with her new primary care provider who she sees in September.

## 2013-11-04 ENCOUNTER — Other Ambulatory Visit: Payer: Self-pay | Admitting: Internal Medicine

## 2013-11-07 ENCOUNTER — Emergency Department (HOSPITAL_COMMUNITY): Payer: BC Managed Care – PPO

## 2013-11-07 ENCOUNTER — Emergency Department (HOSPITAL_COMMUNITY)
Admission: EM | Admit: 2013-11-07 | Discharge: 2013-11-07 | Disposition: A | Discharge status: Home / Self Care | Payer: BC Managed Care – PPO | Attending: Emergency Medicine | Admitting: Emergency Medicine

## 2013-11-07 ENCOUNTER — Encounter (HOSPITAL_COMMUNITY): Payer: Self-pay | Admitting: Emergency Medicine

## 2013-11-07 DIAGNOSIS — S99929A Unspecified injury of unspecified foot, initial encounter: Principal | ICD-10-CM

## 2013-11-07 DIAGNOSIS — Z8739 Personal history of other diseases of the musculoskeletal system and connective tissue: Secondary | ICD-10-CM | POA: Insufficient documentation

## 2013-11-07 DIAGNOSIS — M25561 Pain in right knee: Secondary | ICD-10-CM

## 2013-11-07 DIAGNOSIS — M25562 Pain in left knee: Secondary | ICD-10-CM

## 2013-11-07 DIAGNOSIS — W19XXXA Unspecified fall, initial encounter: Secondary | ICD-10-CM

## 2013-11-07 DIAGNOSIS — Y9389 Activity, other specified: Secondary | ICD-10-CM | POA: Insufficient documentation

## 2013-11-07 DIAGNOSIS — W010XXA Fall on same level from slipping, tripping and stumbling without subsequent striking against object, initial encounter: Secondary | ICD-10-CM | POA: Insufficient documentation

## 2013-11-07 DIAGNOSIS — X500XXA Overexertion from strenuous movement or load, initial encounter: Secondary | ICD-10-CM | POA: Insufficient documentation

## 2013-11-07 DIAGNOSIS — Y9289 Other specified places as the place of occurrence of the external cause: Secondary | ICD-10-CM | POA: Insufficient documentation

## 2013-11-07 DIAGNOSIS — Z87891 Personal history of nicotine dependence: Secondary | ICD-10-CM | POA: Insufficient documentation

## 2013-11-07 DIAGNOSIS — J45909 Unspecified asthma, uncomplicated: Secondary | ICD-10-CM | POA: Insufficient documentation

## 2013-11-07 DIAGNOSIS — S99919A Unspecified injury of unspecified ankle, initial encounter: Secondary | ICD-10-CM | POA: Insufficient documentation

## 2013-11-07 DIAGNOSIS — Z8619 Personal history of other infectious and parasitic diseases: Secondary | ICD-10-CM | POA: Insufficient documentation

## 2013-11-07 DIAGNOSIS — Z79899 Other long term (current) drug therapy: Secondary | ICD-10-CM | POA: Insufficient documentation

## 2013-11-07 DIAGNOSIS — S8990XA Unspecified injury of unspecified lower leg, initial encounter: Secondary | ICD-10-CM | POA: Insufficient documentation

## 2013-11-07 DIAGNOSIS — I10 Essential (primary) hypertension: Secondary | ICD-10-CM | POA: Insufficient documentation

## 2013-11-07 MED ORDER — DIPHENHYDRAMINE HCL 25 MG PO CAPS
25.0000 mg | ORAL_CAPSULE | Freq: Once | ORAL | Status: AC
  Administered 2013-11-07: 25 mg via ORAL
  Filled 2013-11-07: qty 1

## 2013-11-07 MED ORDER — NAPROXEN 500 MG PO TABS: 500.0000 mg | ORAL_TABLET | Freq: Two times a day (BID) | ORAL | Status: DC

## 2013-11-07 MED ORDER — HYDROCODONE-ACETAMINOPHEN 5-325 MG PO TABS
2.0000 | ORAL_TABLET | Freq: Once | ORAL | Status: AC
  Administered 2013-11-07: 2 via ORAL
  Filled 2013-11-07: qty 2

## 2013-11-07 NOTE — ED Notes (Signed)
Patient transported to MRI 

## 2013-11-07 NOTE — ED Provider Notes (Signed)
CSN: [id number]     Arrival date & time 11/07/13  1201 History   First MD Initiated Contact with Patient 11/07/13 1254     Chief Complaint  Patient presents with  . Fall  . Knee Pain     (Consider location/radiation/quality/duration/timing/severity/associated sxs/prior Treatment) HPI Comments: 58 yo female who tripped over a bench, twisted her right knee, and landed on both knees on a concrete floor, falling onto her chest and left side of her face.  She did not lose consciousness.  She complains primarily of knee pain and headache.    Patient is a 58 y.o. female presenting with fall.  Fall This is a new problem. The current episode started 1 to 2 hours ago. Episode frequency: once. The problem has been resolved. Pertinent negatives include no chest pain, no abdominal pain and no shortness of breath. Associated symptoms comments: Bilateral knee pain, headache. The symptoms are aggravated by bending, twisting and walking. Nothing relieves the symptoms. She has tried nothing for the symptoms.    Past Medical History  Diagnosis Date  . Asthma   . Hypertension   . Herpes   . Arthritis    Past Surgical History  Procedure Laterality Date  . Colonoscopy N/A 08/15/2013    Procedure: COLONOSCOPY;  Surgeon: Jerene Bears, MD;  Location: [facility];  Service: Gastroenterology;  Laterality: N/A;   Family History  Problem Relation Age of Onset  . Arthritis Mother   . Hypertension Mother   . Cancer Mother     pancreatic  . Pancreatic cancer Mother   . Arthritis Father   . Hypertension Father   . Colon cancer Neg Hx    History  Substance Use Topics  . Smoking status: Former Smoker    Quit date: 06/16/2004  . Smokeless tobacco: Never Used  . Alcohol Use: No   OB History   Grav Para Term Preterm Abortions TAB SAB Ect Mult Living   1 1 1       1      Review of Systems  Respiratory: Negative for shortness of breath.   Cardiovascular: Negative for chest pain.  Gastrointestinal:  Negative for nausea, vomiting and abdominal pain.  All other systems reviewed and are negative.     Allergies  Ace inhibitors  Home Medications   Prior to Admission medications   Medication Sig Start Date End Date Taking? Authorizing Provider  acyclovir (ZOVIRAX) 200 MG capsule Take 200 mg by mouth every morning.  06/15/13  Yes Historical Provider, MD  cholecalciferol (VITAMIN D) 1000 UNITS tablet Take 1,000 Units by mouth every morning.   Yes Historical Provider, MD  ferrous sulfate 325 (65 FE) MG tablet Take 325 mg by mouth daily with breakfast.   Yes Historical Provider, MD  furosemide (LASIX) 40 MG tablet Take 40 mg by mouth every morning.   Yes Historical Provider, MD  hydrochlorothiazide (HYDRODIURIL) 25 MG tablet Take 25 mg by mouth every morning.   Yes Historical Provider, MD  PROVENTIL HFA 108 (90 BASE) MCG/ACT inhaler Inhale 2 puffs into the lungs every 4 (four) hours as needed for wheezing or shortness of breath.  08/17/13  Yes Historical Provider, MD   BP 124/81  Pulse 70  Temp(Src) 97.5 F (36.4 C) (Oral)  Resp 18  SpO2 100% Physical Exam  Nursing note and vitals reviewed. Constitutional: She is oriented to person, place, and time. She appears well-developed and well-nourished. No distress.  HENT:  Head: Normocephalic and atraumatic. Head is without raccoon's eyes  and without Battle's sign.  Nose: Nose normal.  No facial tenderness to palpation or external signs of injury  Eyes: Conjunctivae and EOM are normal. Pupils are equal, round, and reactive to light. No scleral icterus.  Neck: No spinous process tenderness and no muscular tenderness present.  Cardiovascular: Normal rate, regular rhythm, normal heart sounds and intact distal pulses.   No murmur heard. Pulmonary/Chest: Effort normal and breath sounds normal. She has no rales. She exhibits no tenderness.  Abdominal: Soft. There is no tenderness. There is no rebound and no guarding.  Musculoskeletal: Normal range  of motion. She exhibits no edema.       Right knee: She exhibits swelling. She exhibits normal range of motion, no deformity, normal alignment, no LCL laxity and no MCL laxity. Tenderness found.       Left knee: She exhibits swelling. She exhibits normal range of motion, no deformity, normal alignment, no LCL laxity and no MCL laxity. Tenderness found.       Thoracic back: She exhibits no tenderness and no bony tenderness.       Lumbar back: She exhibits no tenderness and no bony tenderness.  Bilateral knee exam limited by obesity. No evidence of trauma to extremities, except as noted.  2+ distal pulses.    Neurological: She is alert and oriented to person, place, and time.  Skin: Skin is warm and dry. No rash noted.  Psychiatric: She has a normal mood and affect.    ED Course  Procedures (including critical care time) Labs Review Labs Reviewed - No data to display  Imaging Review Dg Knee Complete 4 Views Left  11/07/2013   CLINICAL DATA:  Pain post trauma  EXAM: LEFT KNEE - COMPLETE 4+ VIEW  COMPARISON:  July 09, 2013  FINDINGS: Frontal, lateral, and bilateral oblique views were obtained. There is no apparent fracture or dislocation. There is a joint effusion. There is generalized joint space narrowing with spurring in all compartments. No erosive change.  IMPRESSION: Multifocal osteoarthritic change. Joint effusion present. No fracture or dislocation.   Electronically Signed   By: Lowella Grip M.D.   On: 11/07/2013 14:34   Dg Knee Complete 4 Views Right  11/07/2013   CLINICAL DATA:  Status post fall.  EXAM: RIGHT KNEE - COMPLETE 4+ VIEW  COMPARISON:  Right knee series of July 09, 2013  FINDINGS: There is moderate degenerative change involving the lateral joint compartment. There is beaking of the tibial spines. There are mild to moderate degenerative change of the patellofemoral joint. There is no acute fracture nor dislocation. A small joint effusion may be present.  IMPRESSION: There  is moderate degenerative change of the right knee especially of the lateral joint compartment. No depressed tibial plateau fracture is demonstrated. If further imaging is desired, MRI would be the most sensitive modality for detecting occult bony injury.   Electronically Signed   By: David  Martinique   On: 11/07/2013 14:32  All radiology studies independently viewed by me.      EKG Interpretation None      MDM   Final diagnoses:  Fall, initial encounter  Bilateral knee pain    58 yo female presenting after fall.  Only pain on exam is to her bilateral knees.  Otherwise, no injuries identified on history or exam.  Knee exam limited by obesity.  Plain films of left knee show arthritis.  Plain films of right knee show arthritis and suggest possibility of occult fracture.  Due to her obesity  and severe pain, feel that MR is needed to rule out fracture.  Care transferred to Dr. Aline Brochure who will follow up results of imaging.  Anticipate dc if knee imaging negative.     Houston Siren III, MD 11/07/13 [phone]

## 2013-11-07 NOTE — ED Notes (Signed)
Pt states she was at the gym in the locker room about to go swimming, when her R leg caught the leg of the bench and twisted, pt states she fell forward to the ground, did not have LOC or hit head, states also having R knee pain, states R knee pain is worse than L, no swelling or bruising noted.

## 2013-11-07 NOTE — Discharge Instructions (Signed)

## 2013-11-07 NOTE — ED Notes (Signed)
Bed: WA20 Expected date: 11/07/13 Expected time: 11:43 AM Means of arrival: Ambulance Comments: Fall; knee pain

## 2013-11-07 NOTE — ED Provider Notes (Signed)
9:36 PM Care accepted from Dr. Doy Mince. MRI non-contrib.   9:37 PM:  I have discussed the diagnosis/risks/treatment options with the patient and believe the pt to be eligible for discharge home to follow-up with her orthopedist. We also discussed returning to the ED immediately if new or worsening sx occur. We discussed the sx which are most concerning (e.g., worsening pain) that necessitate immediate return. Medications administered to the patient during their visit and any new prescriptions provided to the patient are listed below.  Medications given during this visit Medications  HYDROcodone-acetaminophen (NORCO/VICODIN) 5-325 MG per tablet 2 tablet (2 tablets Oral Given 11/07/13 1337)  diphenhydrAMINE (BENADRYL) capsule 25 mg (25 mg Oral Given 11/07/13 1348)    New Prescriptions   NAPROXEN (NAPROSYN) 500 MG TABLET    Take 1 tablet (500 mg total) by mouth 2 (two) times daily with a meal.   Clinical Impression 1. Fall, initial encounter   2. Bilateral knee pain      Blanchard Kelch, MD 11/07/13 2137

## 2013-11-07 NOTE — ED Notes (Signed)
Per EMS pt from [facility]; pt slipped in locker room and fell; twisting right knee; c/o bilateral left pain

## 2013-11-07 NOTE — ED Notes (Signed)
Patient transported to X-ray 

## 2013-11-21 ENCOUNTER — Ambulatory Visit: Payer: BC Managed Care – PPO | Admitting: Family Medicine

## 2013-11-28 ENCOUNTER — Ambulatory Visit: Payer: BC Managed Care – PPO | Admitting: Family Medicine

## 2013-11-30 ENCOUNTER — Encounter: Payer: Self-pay | Admitting: Family Medicine

## 2013-11-30 ENCOUNTER — Ambulatory Visit (INDEPENDENT_AMBULATORY_CARE_PROVIDER_SITE_OTHER): Payer: BC Managed Care – PPO | Admitting: Family Medicine

## 2013-11-30 VITALS — BP 119/92 | HR 86 | Ht 63.0 in | Wt 280.0 lb

## 2013-11-30 DIAGNOSIS — M1710 Unilateral primary osteoarthritis, unspecified knee: Secondary | ICD-10-CM | POA: Diagnosis not present

## 2013-11-30 DIAGNOSIS — M170 Bilateral primary osteoarthritis of knee: Secondary | ICD-10-CM

## 2013-11-30 MED ORDER — TRAMADOL HCL 50 MG PO TABS: ORAL_TABLET | ORAL | Status: DC

## 2013-11-30 NOTE — Patient Instructions (Signed)
Referral made to Dr. Ninfa Linden at [facility], per Dr. Nori Riis for possible bilateral knee replacements.  Appointment is Wed. August 26 at 9:15am [address].

## 2013-11-30 NOTE — Assessment & Plan Note (Signed)
Known tricompartmental l DJD bilateral knees. We tried corticosteroid injection into left knee last time and had no benefit. She likely needs bilateral knee replacement. We had discussed her obesity at last office visit and she's been working hard on that. At this point I will give her some short-term prescription for tramadol. Long-term pain medication we have to prescribed by her new PCP. She would like to see Dr. Ninfa Linden in orthopedics we will set that up. I would be happy to see her back when necessary but really there is nothing additional I can do for her knees.

## 2013-11-30 NOTE — Progress Notes (Signed)
Patient ID: Linda Cabrera, female   DOB: 10-10-1955, 58 y.o.   MRN: [id number]  Linda Cabrera - 58 y.o. female MRN [id number]  Date of birth: 08-05-1955    SUBJECTIVE:     Bilateral knee pain. At last office visit we did a left knee corticosteroid injection. She had flu no relief. She did have a fall since I saw her last, resulting in a twisting injury to the right knee which is crusted to swell little bit more. Pain is in both knees, constant, 8-9/10. Bothering her at night as well. ROS:     No fever, sweats, chills, unusual weight change.  PERTINENT  PMH / PSH FH / / SH:  Past Medical, Surgical, Social, and Family History Reviewed & Updated in the EMR.  Pertinent findings include:  Obesity, no personal history of diabetes mellitus, hypertension.  OBJECTIVE: BP 119/92  Pulse 86  Ht 5\' 3"  (1.6 m)  Wt 280 lb (127.007 kg)  BMI 49.61 kg/m2  Physical Exam:  Vital signs are reviewed. GENERAL: Well-developed obese female no acute distress KNEES: Bilaterally tender to palpation along medial joint line. No effusion. Popliteal space is soft. Calf is soft. Distally neurovascularly intact. IMAGING: Tricompartmental osteoarthritis severe bilateral knees.  ASSESSMENT & PLAN:  See problem based charting & AVS for pt instructions.

## 2013-12-20 ENCOUNTER — Ambulatory Visit: Payer: BC Managed Care – PPO | Attending: Orthopaedic Surgery | Admitting: Physical Therapy

## 2013-12-20 DIAGNOSIS — M25569 Pain in unspecified knee: Secondary | ICD-10-CM | POA: Insufficient documentation

## 2013-12-20 DIAGNOSIS — IMO0001 Reserved for inherently not codable concepts without codable children: Secondary | ICD-10-CM | POA: Diagnosis present

## 2013-12-20 DIAGNOSIS — R262 Difficulty in walking, not elsewhere classified: Secondary | ICD-10-CM | POA: Insufficient documentation

## 2013-12-20 DIAGNOSIS — M25669 Stiffness of unspecified knee, not elsewhere classified: Secondary | ICD-10-CM | POA: Diagnosis not present

## 2013-12-21 ENCOUNTER — Telehealth: Payer: Self-pay | Admitting: Family Medicine

## 2013-12-22 ENCOUNTER — Telehealth: Payer: Self-pay | Admitting: Internal Medicine

## 2013-12-22 ENCOUNTER — Telehealth: Payer: Self-pay | Admitting: General Practice

## 2013-12-22 MED ORDER — ACYCLOVIR 200 MG PO CAPS: 200.0000 mg | ORAL_CAPSULE | Freq: Every morning | ORAL | Status: DC

## 2013-12-22 NOTE — Telephone Encounter (Signed)
Pt called to check up on this request.  °

## 2013-12-22 NOTE — Telephone Encounter (Signed)
Pt request refill for cyclovir 200 mg to be send to [facility]. Please advise. Pt has an appt 12/26/13.

## 2013-12-22 NOTE — Telephone Encounter (Signed)
Patient called and left message stating she would like to know if her medication has been refilled yet. Called patient back and she states her family doctor is working on that prescription for her. Patient had no questions at this time.

## 2013-12-22 NOTE — Telephone Encounter (Signed)
1 month supply sent in and she needs to keep apt to get further refills.   Dr. Doug Sou

## 2013-12-22 NOTE — Telephone Encounter (Signed)
Patient will go pick up prescription and keep next appointment.

## 2013-12-26 ENCOUNTER — Encounter: Payer: Self-pay | Admitting: Internal Medicine

## 2013-12-26 ENCOUNTER — Ambulatory Visit (INDEPENDENT_AMBULATORY_CARE_PROVIDER_SITE_OTHER): Payer: BC Managed Care – PPO | Admitting: Internal Medicine

## 2013-12-26 VITALS — BP 120/62 | HR 83 | Temp 98.6°F | Resp 20 | Ht 63.0 in | Wt 299.1 lb

## 2013-12-26 DIAGNOSIS — M1990 Unspecified osteoarthritis, unspecified site: Secondary | ICD-10-CM

## 2013-12-26 DIAGNOSIS — J45909 Unspecified asthma, uncomplicated: Secondary | ICD-10-CM

## 2013-12-26 DIAGNOSIS — B009 Herpesviral infection, unspecified: Secondary | ICD-10-CM

## 2013-12-26 DIAGNOSIS — I10 Essential (primary) hypertension: Secondary | ICD-10-CM

## 2013-12-26 DIAGNOSIS — Z23 Encounter for immunization: Secondary | ICD-10-CM | POA: Diagnosis not present

## 2013-12-26 MED ORDER — "ADJUSTABLE ALUMINUM CANE 7/8"" MISC": Status: DC

## 2013-12-26 NOTE — Progress Notes (Signed)
   Subjective:    Patient ID: Linda Cabrera, female    DOB: 1955/06/02, 58 y.o.   MRN: [id number]  HPI The patient is a 58 YO female who is coming in today to establish care. She has PMH of asthma, DJD in her knee, HSV, HTN, obesity. She is going to get lap band soon to help her lose weight so she can get knee replacement. She has pain in both knees and needs both replaced. She feels that this is her biggest issue. She does not exercise much as she is in pain with walking. She did not fill the pain medication from the sports medicine doctor. She denies chest pains, SOB, abdominal pain, diarrhea. She does have constipation and usually moves her bowels about once per week. She does take a stool softener with iron and tries to drink water. She does not notice any blood in her stool. She taking acyclovir daily as she used to have bad outbreaks and this helps reduce flares. She does have asthma but does not use her albuterol more than once per week, never has night time awakenings, has had no flares in the last year and has never needed intubation. She would like a prescription for a cane.  Review of Systems  Constitutional: Negative for chills, activity change, appetite change and fatigue.  Respiratory: Negative for cough, chest tightness, shortness of breath and wheezing.   Cardiovascular: Negative for chest pain, palpitations and leg swelling.  Gastrointestinal: Negative for abdominal pain, diarrhea, constipation and abdominal distention.  Musculoskeletal: Positive for arthralgias and gait problem.  Neurological: Negative for dizziness and headaches.      Objective:   Physical Exam  Vitals reviewed. Constitutional: She appears well-developed and well-nourished.  Obese  HENT:  Head: Normocephalic and atraumatic.  Eyes: EOM are normal.  Neck: Normal range of motion.  Cardiovascular: Normal rate and regular rhythm.   Pulmonary/Chest: Effort normal. No respiratory distress. She has no wheezes.    Breath sounds decreased due to body habitus  Abdominal: Soft. Bowel sounds are normal. She exhibits no distension. There is no tenderness. There is no rebound.  Musculoskeletal: She exhibits tenderness.  Both knees tender  Neurological: Coordination abnormal.  Difficult to walk with pain.   Filed Vitals:   12/26/13 1258  BP: 120/62  Pulse: 83  Temp: 98.6 F (37 C)  TempSrc: Oral  Resp: 20  Height: 5\' 3"  (1.6 m)  Weight: 299 lb 1.3 oz (135.662 kg)  SpO2: 96%      Assessment & Plan:

## 2013-12-26 NOTE — Assessment & Plan Note (Signed)
The patient takes acyclovir daily to avoid flares. She states that before she was doing this she had multiple flares.

## 2013-12-26 NOTE — Assessment & Plan Note (Signed)
See asthma history but intermittent and does not use albuterol more than 1-2 times per week. Her husband does smoke and this is the cause of most times she needs albuterol. She is trying to get him to quit.

## 2013-12-26 NOTE — Assessment & Plan Note (Signed)
She has not picked up the naproxen or the tramadol from the sports medicine clinic and is unable to get around well.

## 2013-12-26 NOTE — Assessment & Plan Note (Signed)
Patient on HCTZ 25 mg daily and lasix 40 mg daily for swelling. Would likely be okay to D/C lasix if patient wants. She wants to keep for now. Filed Vitals:   12/26/13 1258  BP: 120/62  Pulse: 83  Temp: 98.6 F (37 C)  TempSrc: Oral  Resp: 20  Height: 5\' 3"  (1.6 m)  Weight: 299 lb 1.3 oz (135.662 kg)  SpO2: 96%

## 2013-12-26 NOTE — Patient Instructions (Signed)
We would like you to work on losing weight and get the lap band surgery. Make sure to ask the surgeon if they are able to do your hernia at the same time.   You are doing well with your blood pressure and we will not change your medicines. We will check blood work when you come back in about 6 months.   Laparoscopic Gastric Band Surgery This surgery is done to help you lose weight.  BEFORE THE SURGERY  Do not gain any more weight once you know you will be having this surgery.  Arrange for someone to take you home from the hospital.  The day before the surgery:  Eat small liquid meals such as broth.  Use half of the surgical scrub, if given, to shower or bathe with. Do not use the surgical scrub to wash your hair. Use regular shampoo.  The day of the surgery:  Shower or bathe using the second half of the surgical scrub.  Arrive at your appointment time.  You will change into a hospital gown.  A tube (IV) will be put in your vein. SURGERY A band is put around the upper part of your stomach. This makes a small pouch which can hold only a small amount of food. The lower, bigger part of your stomach is below the band. The 2 parts stay connected by a small opening between the upper and the lower parts. Food goes through the opening to the lower part of your stomach more slowly than before the surgery. You will feel more full with smaller amounts of food. On the inner lining of the band around your stomach is a balloon. The balloon is empty during the surgery. Later, at an office visit, it is filled with fluid. Your doctor puts the fluid in through a tube (port) that is right under the skin of your belly. AFTER THE SURGERY  You will go to the recovery room.  You may be given pain medicine.  You may be asked to walk once you are stable.  You may have shoulder pain caused by the gas.  By the time you go home, try to walk for 35 minutes every day.  You will be shown how to use a small  breathing machine (incentive spirometer). This will help you take deep breaths. You need to use this machine several times a day while you are in the hospital and after you go home.  You will need to take another test. For this test, you will swallow a liquid that will show up on X-ray. You will have X-rays taken while you are in different positions. These X-rays will show:  If your new stomach has any leaks.  How well your new stomach holds liquids.  How the liquid moves down to your gut.  Try to not throw up (vomit). Tell your doctor if you feel sick to your stomach (nauseous). There is medicine to help keep you from throwing up.  Your diet will begin with drinking liquids (jello, tea, juice and broth) in small amounts.  Your doctor will decide when you are ready to drink or eat more. Document Released: 05/03/2010 Document Revised: 07/26/2012 Document Reviewed: 05/03/2010 [facility] Patient Information 2015 [place], [place]. This information is not intended to replace advice given to you by your health care provider. Make sure you discuss any questions you have with your health care provider.

## 2013-12-26 NOTE — Progress Notes (Signed)
Pre visit review using our clinic review tool, if applicable. No additional management support is needed unless otherwise documented below in the visit note. 

## 2013-12-26 NOTE — Assessment & Plan Note (Signed)
Patient is currently undergoing process of getting lap band surgery to lose weight so she can have knee replacement.

## 2013-12-27 NOTE — Telephone Encounter (Signed)
Pt called in said that she tried to get the can with [facility] but they did not have them so script has to be sent to a medical supply store.  She is asking for a call back on her cell number but nurse.    Thank You!!

## 2013-12-27 NOTE — Telephone Encounter (Signed)
Left message for patient to call me back. 

## 2013-12-30 ENCOUNTER — Telehealth: Payer: Self-pay | Admitting: Internal Medicine

## 2013-12-30 NOTE — Telephone Encounter (Signed)
Patient is requesting call back in regards to where Dr. Doug Sou sent script for cane

## 2014-01-02 ENCOUNTER — Telehealth: Payer: Self-pay | Admitting: Internal Medicine

## 2014-01-02 ENCOUNTER — Encounter: Payer: BC Managed Care – PPO | Admitting: Physical Therapy

## 2014-01-02 MED ORDER — "ADJUSTABLE ALUMINUM CANE 7/8"" MISC": Status: DC

## 2014-01-02 NOTE — Telephone Encounter (Signed)
Printed

## 2014-01-02 NOTE — Telephone Encounter (Signed)
Patient requests that Dr. Doug Sou send a letter recommending bariatric surgery.  Please send to: Glynn Octave, [facility], [place] [address] [place], [place], [place]  [address]

## 2014-01-02 NOTE — Telephone Encounter (Signed)
Printed and mailed. Patient aware.

## 2014-01-02 NOTE — Telephone Encounter (Signed)
Called patient. Dr. Doug Sou will print out a prescription for a cane and we will mail it to the patient.

## 2014-01-03 ENCOUNTER — Ambulatory Visit: Payer: BC Managed Care – PPO | Admitting: Physical Therapy

## 2014-01-03 DIAGNOSIS — IMO0001 Reserved for inherently not codable concepts without codable children: Secondary | ICD-10-CM | POA: Diagnosis not present

## 2014-01-03 NOTE — Telephone Encounter (Signed)
Sent via fax to [facility] with attn: Glynn Octave on the letter. It is communications if needs to be resent.

## 2014-01-05 ENCOUNTER — Encounter: Payer: BC Managed Care – PPO | Admitting: Physical Therapy

## 2014-01-09 ENCOUNTER — Other Ambulatory Visit: Payer: Self-pay | Admitting: Internal Medicine

## 2014-01-09 ENCOUNTER — Encounter: Payer: BC Managed Care – PPO | Admitting: Physical Therapy

## 2014-01-12 ENCOUNTER — Encounter: Payer: BC Managed Care – PPO | Admitting: Physical Therapy

## 2014-01-20 ENCOUNTER — Other Ambulatory Visit: Payer: Self-pay | Admitting: Internal Medicine

## 2014-01-25 ENCOUNTER — Other Ambulatory Visit (INDEPENDENT_AMBULATORY_CARE_PROVIDER_SITE_OTHER): Payer: Self-pay

## 2014-01-25 DIAGNOSIS — R0683 Snoring: Secondary | ICD-10-CM

## 2014-01-25 DIAGNOSIS — I10 Essential (primary) hypertension: Secondary | ICD-10-CM

## 2014-01-26 ENCOUNTER — Encounter: Payer: BC Managed Care – PPO | Admitting: Physical Therapy

## 2014-01-27 ENCOUNTER — Other Ambulatory Visit (INDEPENDENT_AMBULATORY_CARE_PROVIDER_SITE_OTHER): Payer: Self-pay | Admitting: General Surgery

## 2014-01-27 LAB — FOLATE: FOLATE: 15 ng/mL

## 2014-01-27 LAB — CBC
HCT: 40.2 % (ref 36.0–46.0)
Hemoglobin: 13.1 g/dL (ref 12.0–15.0)
MCH: 26.6 pg (ref 26.0–34.0)
MCHC: 32.6 g/dL (ref 30.0–36.0)
MCV: 81.5 fL (ref 78.0–100.0)
PLATELETS: 269 10*3/uL (ref 150–400)
RBC: 4.93 MIL/uL (ref 3.87–5.11)
RDW: 15.5 % (ref 11.5–15.5)
WBC: 6.1 10*3/uL (ref 4.0–10.5)

## 2014-01-28 LAB — HEMOGLOBIN A1C
HEMOGLOBIN A1C: 6.1 % — AB (ref <5.7)
Mean Plasma Glucose: 128 mg/dL — ABNORMAL HIGH (ref <117)

## 2014-01-28 LAB — LIPID PANEL
Cholesterol: 140 mg/dL (ref 0–200)
HDL: 66 mg/dL (ref >39)
LDL Cholesterol: 63 mg/dL (ref 0–99)
Total CHOL/HDL Ratio: 2.1 Ratio
Triglycerides: 56 mg/dL (ref <150)
VLDL: 11 mg/dL (ref 0–40)

## 2014-01-28 LAB — VITAMIN B12: Vitamin B-12: 698 pg/mL (ref 211–911)

## 2014-01-28 LAB — COMPREHENSIVE METABOLIC PANEL
ALT: 11 U/L (ref 0–35)
AST: 16 U/L (ref 0–37)
Albumin: 3.6 g/dL (ref 3.5–5.2)
Alkaline Phosphatase: 89 U/L (ref 39–117)
BILIRUBIN TOTAL: 0.3 mg/dL (ref 0.2–1.2)
BUN: 11 mg/dL (ref 6–23)
CO2: 32 meq/L (ref 19–32)
Calcium: 9.2 mg/dL (ref 8.4–10.5)
Chloride: 98 mEq/L (ref 96–112)
Creat: 0.72 mg/dL (ref 0.50–1.10)
GLUCOSE: 92 mg/dL (ref 70–99)
Potassium: 3.4 mEq/L — ABNORMAL LOW (ref 3.5–5.3)
Sodium: 141 mEq/L (ref 135–145)
Total Protein: 6.9 g/dL (ref 6.0–8.3)

## 2014-01-28 LAB — TSH: TSH: 0.985 u[IU]/mL (ref 0.350–4.500)

## 2014-01-28 LAB — IRON AND TIBC
%SAT: 14 % — ABNORMAL LOW (ref 20–55)
Iron: 44 ug/dL (ref 42–145)
TIBC: 307 ug/dL (ref 250–470)
UIBC: 263 ug/dL (ref 125–400)

## 2014-01-28 LAB — VITAMIN D 25 HYDROXY (VIT D DEFICIENCY, FRACTURES): VIT D 25 HYDROXY: 30 ng/mL (ref 30–89)

## 2014-01-30 LAB — H. PYLORI ANTIBODY, IGG: H PYLORI IGG: 2.96 {ISR} — AB

## 2014-01-31 ENCOUNTER — Ambulatory Visit: Payer: BC Managed Care – PPO | Admitting: Physical Therapy

## 2014-02-09 ENCOUNTER — Ambulatory Visit (HOSPITAL_COMMUNITY)
Admission: RE | Admit: 2014-02-09 | Discharge: 2014-02-09 | Disposition: A | Discharge status: Home / Self Care | Payer: BC Managed Care – PPO | Source: Ambulatory Visit | Attending: General Surgery | Admitting: General Surgery

## 2014-02-13 ENCOUNTER — Encounter: Payer: Self-pay | Admitting: Internal Medicine

## 2014-02-13 ENCOUNTER — Encounter (HOSPITAL_COMMUNITY)
Admission: RE | Disposition: A | Discharge status: Home / Self Care | Payer: Self-pay | Source: Ambulatory Visit | Attending: General Surgery

## 2014-02-13 ENCOUNTER — Ambulatory Visit (HOSPITAL_COMMUNITY)
Admission: RE | Admit: 2014-02-13 | Discharge: 2014-02-13 | Disposition: A | Discharge status: Home / Self Care | Payer: BC Managed Care – PPO | Source: Ambulatory Visit | Attending: General Surgery | Admitting: General Surgery

## 2014-02-13 DIAGNOSIS — B9681 Helicobacter pylori [H. pylori] as the cause of diseases classified elsewhere: Secondary | ICD-10-CM | POA: Insufficient documentation

## 2014-02-13 DIAGNOSIS — I10 Essential (primary) hypertension: Secondary | ICD-10-CM | POA: Insufficient documentation

## 2014-02-13 DIAGNOSIS — B009 Herpesviral infection, unspecified: Secondary | ICD-10-CM | POA: Insufficient documentation

## 2014-02-13 DIAGNOSIS — M170 Bilateral primary osteoarthritis of knee: Secondary | ICD-10-CM | POA: Diagnosis not present

## 2014-02-13 DIAGNOSIS — Z6841 Body Mass Index (BMI) 40.0 and over, adult: Secondary | ICD-10-CM | POA: Diagnosis not present

## 2014-02-13 DIAGNOSIS — J45909 Unspecified asthma, uncomplicated: Secondary | ICD-10-CM | POA: Insufficient documentation

## 2014-02-13 DIAGNOSIS — Z87891 Personal history of nicotine dependence: Secondary | ICD-10-CM | POA: Diagnosis not present

## 2014-02-13 HISTORY — PX: BREATH TEK H PYLORI: SHX5422

## 2014-02-13 SURGERY — BREATH TEST, FOR HELICOBACTER PYLORI

## 2014-02-13 NOTE — Progress Notes (Signed)
   02/13/14 0907  BREATH TEK ASSESSMENT  Time of Last PO Intake 2200 (on 02/12/2014)  Baseline Breath At: 0718  Pranactin Given At: 0720  Post-Dose Breath At: 0735  Sample 1 4.8  Sample 2 4.1  Test Postive

## 2014-02-14 ENCOUNTER — Ambulatory Visit (HOSPITAL_COMMUNITY)
Admission: RE | Admit: 2014-02-14 | Discharge: 2014-02-14 | Disposition: A | Discharge status: Home / Self Care | Payer: BC Managed Care – PPO | Source: Ambulatory Visit | Attending: General Surgery | Admitting: General Surgery

## 2014-02-14 ENCOUNTER — Other Ambulatory Visit: Payer: Self-pay

## 2014-02-14 ENCOUNTER — Encounter (HOSPITAL_COMMUNITY): Payer: Self-pay | Admitting: General Surgery

## 2014-02-14 DIAGNOSIS — Z01810 Encounter for preprocedural cardiovascular examination: Secondary | ICD-10-CM | POA: Insufficient documentation

## 2014-02-14 DIAGNOSIS — Z01818 Encounter for other preprocedural examination: Secondary | ICD-10-CM | POA: Insufficient documentation

## 2014-02-14 DIAGNOSIS — I10 Essential (primary) hypertension: Secondary | ICD-10-CM | POA: Diagnosis not present

## 2014-02-14 DIAGNOSIS — R9431 Abnormal electrocardiogram [ECG] [EKG]: Secondary | ICD-10-CM | POA: Insufficient documentation

## 2014-02-14 DIAGNOSIS — R0683 Snoring: Secondary | ICD-10-CM | POA: Diagnosis not present

## 2014-02-15 ENCOUNTER — Other Ambulatory Visit: Payer: Self-pay | Admitting: Internal Medicine

## 2014-02-16 ENCOUNTER — Other Ambulatory Visit: Payer: Self-pay | Admitting: Internal Medicine

## 2014-03-02 ENCOUNTER — Other Ambulatory Visit (INDEPENDENT_AMBULATORY_CARE_PROVIDER_SITE_OTHER): Payer: Self-pay

## 2014-03-08 ENCOUNTER — Ambulatory Visit (HOSPITAL_BASED_OUTPATIENT_CLINIC_OR_DEPARTMENT_OTHER): Payer: BC Managed Care – PPO | Attending: General Surgery | Admitting: Radiology

## 2014-03-08 DIAGNOSIS — R0683 Snoring: Secondary | ICD-10-CM

## 2014-03-08 DIAGNOSIS — G4730 Sleep apnea, unspecified: Secondary | ICD-10-CM | POA: Diagnosis present

## 2014-03-08 DIAGNOSIS — Z6841 Body Mass Index (BMI) 40.0 and over, adult: Secondary | ICD-10-CM | POA: Insufficient documentation

## 2014-03-08 DIAGNOSIS — G4710 Hypersomnia, unspecified: Secondary | ICD-10-CM | POA: Insufficient documentation

## 2014-03-08 DIAGNOSIS — I10 Essential (primary) hypertension: Secondary | ICD-10-CM

## 2014-03-11 DIAGNOSIS — R0683 Snoring: Secondary | ICD-10-CM

## 2014-03-11 DIAGNOSIS — I10 Essential (primary) hypertension: Secondary | ICD-10-CM

## 2014-03-11 NOTE — Sleep Study (Signed)
   NAME: Linda Cabrera DATE OF BIRTH:  May 15, 1955 MEDICAL RECORD NUMBER [id number]  LOCATION: [facility]  PHYSICIAN: Nickol Collister D  DATE OF STUDY: 03/08/2014  SLEEP STUDY TYPE: Nocturnal Polysomnogram               REFERRING PHYSICIAN: Gayland Curry, MD  INDICATION FOR STUDY: Hypersomnia with sleep apnea  EPWORTH SLEEPINESS SCORE:   7/24 HEIGHT: 5\' 3"  (160 cm)  WEIGHT: 295 lb (133.811 kg)    Body mass index is 52.27 kg/(m^2).  NECK SIZE: 14.5 in.  MEDICATIONS: Charted for review  SLEEP ARCHITECTURE: Total sleep time 228.5 minutes with sleep efficiency 53.5%. Stage I 5.5%, stage II to 9.8%, stage III absent, REM 24.7% of total sleep time. Sleep latency 173.5 minutes with sleep onset near 1 AM. REM latency 33 minutes, awake after sleep onset 24.5 minutes, arousal index 5.3, bedtime medication: None  RESPIRATORY DATA: Apnea hypopnea index (AHI) 6.0 per hour. 23 total events scored, all as hypopneas while nonsupine. REM AHI 24.4 per hour. There were not enough early events or early sleep to permit split protocol CPAP titration.  OXYGEN DATA: Mild to moderate snoring with oxygen desaturation to a nadir of 87% and mean saturation 96.1% on room air  CARDIAC DATA: Sinus rhythm with PACs  MOVEMENT/PARASOMNIA: No significant movement disturbance, no bathroom trips  IMPRESSION/ RECOMMENDATION:   1) Mild obstructive sleep apnea/hypopnea syndrome, AHI 6.0 per hour. All events were nonsupine hypopneas, mostly during REM (REM AHI 24.4 per hour). Mild to moderate snoring with oxygen desaturation to a nadir of 87% and mean saturation 96.1% on room air. 2) Difficulty initiating and maintaining sleep, with sleep onset near 1 AM. No bedtime medication taken.  3) Respiratory scores in this range may respond well to conservative measures including weight loss.  Deneise Lever Diplomate, American Board of Sleep Medicine  ELECTRONICALLY SIGNED ON:  03/11/2014, 12:53 PM [place] PH: [phone]   FX: [phone] [place]

## 2014-03-18 ENCOUNTER — Encounter: Payer: BC Managed Care – PPO | Attending: General Surgery | Admitting: Dietician

## 2014-03-18 ENCOUNTER — Encounter: Payer: Self-pay | Admitting: Dietician

## 2014-03-18 DIAGNOSIS — Z713 Dietary counseling and surveillance: Secondary | ICD-10-CM | POA: Insufficient documentation

## 2014-03-18 DIAGNOSIS — Z6841 Body Mass Index (BMI) 40.0 and over, adult: Secondary | ICD-10-CM | POA: Insufficient documentation

## 2014-03-18 NOTE — Patient Instructions (Signed)
Follow Pre-Op Goals Try Protein Shakes Talk to [facility] Monday to ask about surgery date and Pre-Op Class.   Things to remember:  Please always be honest with [place]. We want to support you!  If you have any questions or concerns in between appointments, please call or email Ferol Luz, or Margarita Grizzle.  The diet after surgery will be high protein and low in carbohydrate.  Vitamins and calcium need to be taken for the rest of your life.  Feel free to include support people in any classes or appointments.

## 2014-03-18 NOTE — Progress Notes (Signed)
  Pre-Op Assessment Visit:  Pre-Operative RYGB Surgery  Medical Nutrition Therapy:  Appt start time: 1120   End time:  1200.  Patient was seen on 03/18/2014 for Pre-Operative Nutrition Assessment. Assessment and letter of approval faxed to [facility] Bariatric Surgery Program coordinator on 03/18/2014.   Preferred Learning Style:   No preference indicated   Learning Readiness:   Ready  Handouts given during visit include:  Pre-Op Goals Bariatric Surgery Protein Shakes Pre-Op Diet   During the appointment today the following Pre-Op Goals were reviewed with the patient: Maintain or lose weight as instructed by your surgeon Make healthy food choices Begin to limit portion sizes Limited concentrated sugars and fried foods Keep fat/sugar in the single digits per serving on   food labels Practice CHEWING your food  (aim for 30 chews per bite or until applesauce consistency) Practice not drinking 15 minutes before, during, and 30 minutes after each meal/snack Avoid all carbonated beverages  Avoid/limit caffeinated beverages  Avoid all sugar-sweetened beverages Consume 3 meals per day; eat every 3-5 hours Make a list of non-food related activities Aim for 64-100 ounces of FLUID daily  Aim for at least 60-80 grams of PROTEIN daily Look for a liquid protein source that contain ?15 g protein and ?5 g carbohydrate  (ex: shakes, drinks, shots)  Patient-Centered Goals: Aviendha would like to get back to working after having knee surgery.  Yesena feels like the Pre-Op goals are a 10 level of importance and has a 10 level of confidence of being able to follow the goals.   Demonstrated degree of understanding via:  Teach Back  Teaching Method Utilized:  Visual Auditory Hands on  Barriers to learning/adherence to lifestyle change: knee pain, financial   [facility] to [facility] to enroll in Pre-Op and Post-Op Nutrition Education when  surgery date is scheduled.

## 2014-03-21 ENCOUNTER — Encounter: Payer: BC Managed Care – PPO | Admitting: Dietician

## 2014-03-21 NOTE — Patient Instructions (Addendum)
Follow Pre-Op Goals Try Protein Shakes  Focus on protein foods: meat, eggs, low fat cheese, Dannon Light and Fit Greek yogurt, protein shakes  Things to remember:  Please always be honest with [place]. We want to support you!  If you have any questions or concerns in between appointments, please call or email Ferol Luz, or Margarita Grizzle.  The diet after surgery will be high protein and low in carbohydrate.  Vitamins and calcium need to be taken for the rest of your life.  Feel free to include support people in any classes or appointments.

## 2014-03-21 NOTE — Progress Notes (Signed)
  Medical Nutrition Therapy:  Appt start time: 1400 end time:  8937.  Assessment:  Primary concerns today: Linda Cabrera is here today for her 1st of 6 SWL visits in preparation for RYGB. Her goal is to lose weight in order to have knee surgery. She reports that she recently had to stop doing water aerobics due to financial reasons and is anxious to get back to [facility]. She has been practicing chewing her food thoroughly and working on cutting out sugar. Husband brings sweet foods into the house. [name] reports she lost 100 lbs in the past with phentermine and eating mostly protein and vegetables.   MEDICATIONS: see list  DIETARY INTAKE:  24-hr recall:  B ( AM): oatmeal  Snk ( AM) :  L ( PM): fried fish, sausage dog  Snk ( PM): frozen yogurt D ( PM): baked chicken with greens  Snk ( PM):   Beverages: water, juice  Recent physical activity: water aerobics previously, had to stop due to financial reasons  Estimated energy needs: 1500-1600 calories  Progress Towards Goal(s):  In progress.   Nutritional Diagnosis:  [place]-3.3 Overweight/obesity related to past poor dietary habits and physical inactivity as evidenced by patient w/ pending RYGB surgery following dietary guidelines for continued weight loss.     Intervention:  Nutrition counseling provided.  Monitoring/Evaluation:  Dietary intake, exercise, and body weight in 4 week(s).

## 2014-04-18 ENCOUNTER — Telehealth: Payer: Self-pay | Admitting: Internal Medicine

## 2014-04-18 DIAGNOSIS — M79673 Pain in unspecified foot: Secondary | ICD-10-CM

## 2014-04-18 NOTE — Telephone Encounter (Signed)
Tried to reach patient. No answer and no voicemail on home phone. Left message on cell phone.

## 2014-04-18 NOTE — Telephone Encounter (Signed)
Pt requesting referral to foot specialist, pain. [phone]

## 2014-04-18 NOTE — Telephone Encounter (Signed)
Would you like to send this patient to a podiatrist?

## 2014-04-18 NOTE — Telephone Encounter (Signed)
Will place referral

## 2014-04-20 ENCOUNTER — Ambulatory Visit: Payer: BC Managed Care – PPO | Admitting: Dietician

## 2014-04-27 ENCOUNTER — Encounter: Payer: 59 | Attending: General Surgery | Admitting: Dietician

## 2014-04-27 DIAGNOSIS — Z6841 Body Mass Index (BMI) 40.0 and over, adult: Secondary | ICD-10-CM | POA: Diagnosis not present

## 2014-04-27 DIAGNOSIS — Z713 Dietary counseling and surveillance: Secondary | ICD-10-CM | POA: Diagnosis not present

## 2014-04-27 NOTE — Progress Notes (Addendum)
  Medical Nutrition Therapy:  Appt start time: 1400 end time:  5366.  Assessment:  Primary concerns today: Labella is here today for her 1st of 6 SWL visits in preparation for RYGB. Her goal is to lose weight in order to have knee surgery. She reports that she recently had to stop doing water aerobics due to financial reasons and is anxious to get back to [facility]. She has been practicing chewing her food thoroughly and working on cutting out sugar. Husband brings sweet foods into the house. Maylyn reports she lost 100 lbs in the past with phentermine and eating mostly protein and vegetables.   04/27/14: Patient states that she needs help cutting back on the appetite.  Has cut out sugar.  Has not been able to restart water aerobics but would like to.  Working on chewing slowly.  Eats in front of the TV all meals and snacks.  Unable to work due to knee.  States that she is eating fruit or veges when hungry.  MEDICATIONS: see list  DIETARY INTAKE:  24-hr recall:  B ( AM): oatmeal  Snk ( AM) :  L ( PM): salad with chicken, cheese, eggs, onions Snk ( PM): frozen yogurt 1 cup D ( PM): baked chicken with greens or hotdog with [employer] bun Snk ( PM): popcorn (1/2 bag) or fruit  Beverages: water, juice  Recent physical activity: water aerobics previously, had to stop due to financial reasons  Estimated energy needs: 1500-1600 calories  Progress Towards Goal(s):  In progress.   Nutritional Diagnosis:  [place]-3.3 Overweight/obesity related to past poor dietary habits and physical inactivity as evidenced by patient w/ pending RYGB surgery following dietary guidelines for continued weight loss.     Intervention:  Nutrition counseling provided. Encouraged arm chair exercises.  Handout provided.  Consider inquiry about [employer] scholarship.  Monitoring/Evaluation:  Dietary intake, exercise, and body weight in 4 week(s).

## 2014-05-09 ENCOUNTER — Ambulatory Visit: Payer: 59 | Admitting: Podiatry

## 2014-05-11 ENCOUNTER — Telehealth: Payer: Self-pay | Admitting: Internal Medicine

## 2014-05-11 ENCOUNTER — Other Ambulatory Visit: Payer: Self-pay | Admitting: Geriatric Medicine

## 2014-05-11 MED ORDER — ACYCLOVIR 200 MG PO CAPS: ORAL_CAPSULE | ORAL | Status: DC

## 2014-05-11 NOTE — Telephone Encounter (Signed)
Sent to pharmacy 

## 2014-05-11 NOTE — Telephone Encounter (Signed)
Pt called in requesting refill on acyclovir (ZOVIRAX) 200 MG capsule [[id number]]  To be sent to [place]

## 2014-05-29 ENCOUNTER — Ambulatory Visit: Payer: 59 | Admitting: Dietician

## 2014-05-30 ENCOUNTER — Encounter: Payer: 59 | Attending: General Surgery | Admitting: Dietician

## 2014-05-30 DIAGNOSIS — Z6841 Body Mass Index (BMI) 40.0 and over, adult: Secondary | ICD-10-CM | POA: Insufficient documentation

## 2014-05-30 DIAGNOSIS — Z713 Dietary counseling and surveillance: Secondary | ICD-10-CM | POA: Insufficient documentation

## 2014-05-30 NOTE — Progress Notes (Signed)
  Medical Nutrition Therapy:  Appt start time: 1400 end time:  8768.  SWL visit 2:  Primary concerns today:   Linda Cabrera returns having lost some weight since last visit. She states she has "been eating the same as always". However she is working on eating slowly and cutting back on sugar and starchy foods. Trying to keep sweets out of the house and increasing vegetable intake. Trying to move more overall. Has bought some protein powder. She is interested in joining [employer]; printed scholarship form today.  Goal: Work on increasing exercise; apply for [employer] Readings from Last 3 Encounters:  05/30/14 296 lb 8 oz (134.492 kg)  03/21/14 299 lb 12.8 oz (135.988 kg)  03/18/14 300 lb 6.4 oz (136.261 kg)   Ht Readings from Last 3 Encounters:  03/21/14 5\' 3"  (1.6 m)  03/18/14 5\' 3"  (1.6 m)  03/08/14 5\' 3"  (1.6 m)   Body mass index is 52.54 kg/(m^2). @BMIFA @ Normalized weight-for-age data available only for age 63 to 48 years. Normalized stature-for-age data available only for age 63 to 33 years.   MEDICATIONS: see list  DIETARY INTAKE:  24-hr recall:  B ( AM): oatmeal  Snk ( AM) :  L ( PM): salad with chicken, cheese, eggs, onions Snk ( PM): frozen yogurt 1 cup D ( PM): baked chicken with greens or hotdog with [employer] bun Snk ( PM): popcorn (1/2 bag) or fruit  Beverages: water, juice  Recent physical activity: water aerobics previously, had to stop due to financial reasons  Estimated energy needs: 1500-1600 calories  Progress Towards Goal(s):  In progress.   Nutritional Diagnosis:  [place]-3.3 Overweight/obesity related to past poor dietary habits and physical inactivity as evidenced by patient w/ pending RYGB surgery following dietary guidelines for continued weight loss.     Intervention:  Nutrition counseling provided. Encouraged arm chair exercises.  Handout provided.  Consider inquiry about [employer] scholarship.  Monitoring/Evaluation:  Dietary intake, exercise, and body  weight in 4 week(s).

## 2014-06-13 ENCOUNTER — Ambulatory Visit (INDEPENDENT_AMBULATORY_CARE_PROVIDER_SITE_OTHER): Payer: 59 | Admitting: Internal Medicine

## 2014-06-13 ENCOUNTER — Encounter: Payer: Self-pay | Admitting: Internal Medicine

## 2014-06-13 VITALS — BP 128/78 | HR 69 | Temp 97.9°F | Resp 16 | Ht 63.0 in | Wt 300.8 lb

## 2014-06-13 DIAGNOSIS — Z0000 Encounter for general adult medical examination without abnormal findings: Secondary | ICD-10-CM

## 2014-06-13 MED ORDER — HYDROCODONE-ACETAMINOPHEN 5-325 MG PO TABS: 1.0000 | ORAL_TABLET | Freq: Two times a day (BID) | ORAL | Status: DC | PRN

## 2014-06-13 MED ORDER — PHENTERMINE HCL 37.5 MG PO CAPS: 37.5000 mg | ORAL_CAPSULE | ORAL | Status: DC

## 2014-06-13 MED ORDER — FUROSEMIDE 40 MG PO TABS: 40.0000 mg | ORAL_TABLET | Freq: Every day | ORAL | Status: DC

## 2014-06-13 NOTE — Progress Notes (Signed)
Pre visit review using our clinic review tool, if applicable. No additional management support is needed unless otherwise documented below in the visit note. 

## 2014-06-13 NOTE — Patient Instructions (Signed)
We have given you the stronger pain medicine to get you moving and walking. Be sure to fill out the form for the [facility] as water aerobics are the best form of exercise as they are easier on your joints and still get you a good work out.  You need to work on eating better at home and exercising to lose weight to save your life. Eventually if you cannot get rid of the weight your knees will not be good and the heart will have problems.   We are also giving you the phentermine which you take everyday which helps with decreasing your appetite. Come back in 2 months so that we can check on the blood work and how you are doing. If it not a safe medicine to be on long term and the longest we could keep you on it is 6 months.   Calorie Counting for Weight Loss Calories are energy you get from the things you eat and drink. Your body uses this energy to keep you going throughout the day. The number of calories you eat affects your weight. When you eat more calories than your body needs, your body stores the extra calories as fat. When you eat fewer calories than your body needs, your body burns fat to get the energy it needs. Calorie counting means keeping track of how many calories you eat and drink each day. If you make sure to eat fewer calories than your body needs, you should lose weight. In order for calorie counting to work, you will need to eat the number of calories that are right for you in a day to lose a healthy amount of weight per week. A healthy amount of weight to lose per week is usually 1-2 lb (0.5-0.9 kg). A dietitian can determine how many calories you need in a day and give you suggestions on how to reach your calorie goal.  WHAT IS MY MY PLAN? My goal is to have __________ calories per day.  If I have this many calories per day, I should lose around __________ pounds per week. WHAT DO I NEED TO KNOW ABOUT CALORIE COUNTING? In order to meet your daily calorie goal, you will need to:  Find out  how many calories are in each food you would like to eat. Try to do this before you eat.  Decide how much of the food you can eat.  Write down what you ate and how many calories it had. Doing this is called keeping a food log. WHERE DO I FIND CALORIE INFORMATION? The number of calories in a food can be found on a Nutrition Facts label. Note that all the information on a label is based on a specific serving of the food. If a food does not have a Nutrition Facts label, try to look up the calories online or ask your dietitian for help. HOW DO I DECIDE HOW MUCH TO EAT? To decide how much of the food you can eat, you will need to consider both the number of calories in one serving and the size of one serving. This information can be found on the Nutrition Facts label. If a food does not have a Nutrition Facts label, look up the information online or ask your dietitian for help. Remember that calories are listed per serving. If you choose to have more than one serving of a food, you will have to multiply the calories per serving by the amount of servings you plan to eat. For  example, the label on a package of bread might say that a serving size is 1 slice and that there are 90 calories in a serving. If you eat 1 slice, you will have eaten 90 calories. If you eat 2 slices, you will have eaten 180 calories. HOW DO I KEEP A FOOD LOG? After each meal, record the following information in your food log:  What you ate.  How much of it you ate.  How many calories it had.  Then, add up your calories. Keep your food log near you, such as in a small notebook in your pocket. Another option is to use a mobile app or website. Some programs will calculate calories for you and show you how many calories you have left each time you add an item to the log. WHAT ARE SOME CALORIE COUNTING TIPS?  Use your calories on foods and drinks that will fill you up and not leave you hungry. Some examples of this include foods  like nuts and nut butters, vegetables, lean proteins, and high-fiber foods (more than 5 g fiber per serving).  Eat nutritious foods and avoid empty calories. Empty calories are calories you get from foods or beverages that do not have many nutrients, such as candy and soda. It is better to have a nutritious high-calorie food (such as an avocado) than a food with few nutrients (such as a bag of chips).  Know how many calories are in the foods you eat most often. This way, you do not have to look up how many calories they have each time you eat them.  Look out for foods that may seem like low-calorie foods but are really high-calorie foods, such as baked goods, soda, and fat-free candy.  Pay attention to calories in drinks. Drinks such as sodas, specialty coffee drinks, alcohol, and juices have a lot of calories yet do not fill you up. Choose low-calorie drinks like water and diet drinks.  Focus your calorie counting efforts on higher calorie items. Logging the calories in a garden salad that contains only vegetables is less important than calculating the calories in a milk shake.  Find a way of tracking calories that works for you. Get creative. Most people who are successful find ways to keep track of how much they eat in a day, even if they do not count every calorie. WHAT ARE SOME PORTION CONTROL TIPS?  Know how many calories are in a serving. This will help you know how many servings of a certain food you can have.  Use a measuring cup to measure serving sizes. This is helpful when you start out. With time, you will be able to estimate serving sizes for some foods.  Take some time to put servings of different foods on your favorite plates, bowls, and cups so you know what a serving looks like.  Try not to eat straight from a bag or box. Doing this can lead to overeating. Put the amount you would like to eat in a cup or on a plate to make sure you are eating the right portion.  Use smaller  plates, glasses, and bowls to prevent overeating. This is a quick and easy way to practice portion control. If your plate is smaller, less food can fit on it.  Try not to multitask while eating, such as watching TV or using your computer. If it is time to eat, sit down at a table and enjoy your food. Doing this will help you to start  recognizing when you are full. It will also make you more aware of what and how much you are eating. HOW CAN I CALORIE COUNT WHEN EATING OUT?  Ask for smaller portion sizes or child-sized portions.  Consider sharing an entree and sides instead of getting your own entree.  If you get your own entree, eat only half. Ask for a box at the beginning of your meal and put the rest of your entree in it so you are not tempted to eat it.  Look for the calories on the menu. If calories are listed, choose the lower calorie options.  Choose dishes that include vegetables, fruits, whole grains, low-fat dairy products, and lean protein. Focusing on smart food choices from each of the 5 food groups can help you stay on track at restaurants.  Choose items that are boiled, broiled, grilled, or steamed.  Choose water, milk, unsweetened iced tea, or other drinks without added sugars. If you want an alcoholic beverage, choose a lower calorie option. For example, a regular margarita can have up to 700 calories and a glass of wine has around 150.  Stay away from items that are buttered, battered, fried, or served with cream sauce. Items labeled "crispy" are usually fried, unless stated otherwise.  Ask for dressings, sauces, and syrups on the side. These are usually very high in calories, so do not eat much of them.  Watch out for salads. Many people think salads are a healthy option, but this is often not the case. Many salads come with bacon, fried chicken, lots of cheese, fried chips, and dressing. All of these items have a lot of calories. If you want a salad, choose a garden salad  and ask for grilled meats or steak. Ask for the dressing on the side, or ask for olive oil and vinegar or lemon to use as dressing.  Estimate how many servings of a food you are given. For example, a serving of cooked rice is  cup or about the size of half a tennis ball or one cupcake wrapper. Knowing serving sizes will help you be aware of how much food you are eating at restaurants. The list below tells you how big or small some common portion sizes are based on everyday objects.  1 oz--4 stacked dice.  3 oz--1 deck of cards.  1 tsp--1 dice.  1 Tbsp-- a Ping-Pong ball.  2 Tbsp--1 Ping-Pong ball.   cup--1 tennis ball or 1 cupcake wrapper.  1 cup--1 baseball. Document Released: 03/31/2005 Document Revised: 08/15/2013 Document Reviewed: 02/03/2013 [facility] Patient Information 2015 [place], [place]. This information is not intended to replace advice given to you by your health care provider. Make sure you discuss any questions you have with your health care provider.  Exercise to Lose Weight Exercise and a healthy diet may help you lose weight. Your doctor may suggest specific exercises. EXERCISE IDEAS AND TIPS  Choose low-cost things you enjoy doing, such as walking, bicycling, or exercising to workout videos.  Take stairs instead of the elevator.  Walk during your lunch break.  Park your car further away from work or school.  Go to a gym or an exercise class.  Start with 5 to 10 minutes of exercise each day. Build up to 30 minutes of exercise 4 to 6 days a week.  Wear shoes with good support and comfortable clothes.  Stretch before and after working out.  Work out until you breathe harder and your heart beats faster.  Drink extra  water when you exercise.  Do not do so much that you hurt yourself, feel dizzy, or get very short of breath. Exercises that burn about 150 calories:  Running 1  miles in 15 minutes.  Playing volleyball for 45 to 60 minutes.  Washing and  waxing a car for 45 to 60 minutes.  Playing touch football for 45 minutes.  Walking 1  miles in 35 minutes.  Pushing a stroller 1  miles in 30 minutes.  Playing basketball for 30 minutes.  Raking leaves for 30 minutes.  Bicycling 5 miles in 30 minutes.  Walking 2 miles in 30 minutes.  Dancing for 30 minutes.  Shoveling snow for 15 minutes.  Swimming laps for 20 minutes.  Walking up stairs for 15 minutes.  Bicycling 4 miles in 15 minutes.  Gardening for 30 to 45 minutes.  Jumping rope for 15 minutes.  Washing windows or floors for 45 to 60 minutes. Document Released: 05/03/2010 Document Revised: 06/23/2011 Document Reviewed: 05/03/2010 [facility] Patient Information 2015 [place], [place]. This information is not intended to replace advice given to you by your health care provider. Make sure you discuss any questions you have with your health care provider.

## 2014-06-15 ENCOUNTER — Encounter: Payer: Self-pay | Admitting: Internal Medicine

## 2014-06-15 DIAGNOSIS — Z0000 Encounter for general adult medical examination without abnormal findings: Secondary | ICD-10-CM | POA: Insufficient documentation

## 2014-06-15 NOTE — Assessment & Plan Note (Signed)
Colonoscopy due in 2025, flu and tetanus up to date. Her biggest health barrier right now is her weight which has caused severe osteoarthritis. She is working on weight loss and hopefully she can be successful otherwise her QOL will be not good.

## 2014-06-15 NOTE — Progress Notes (Signed)
   Subjective:    Patient ID: Linda Cabrera, female    DOB: 1956-02-03, 59 y.o.   MRN: [id number]  HPI The patient is a 59 YO woman coming in for wellness. She denies new complaints or problems since last visit. She is unable to do much physical activity because of joint pain. She is up a few pounds since last visit. She has been unable to start her diet since last visit. She is needing knee replacement but she is too large for them to consider operating at this time.   PMH, [facility], social history reviewed and updated at today's visit.   Review of Systems  Constitutional: Negative for chills, activity change, appetite change and fatigue.  HENT: Negative.   Eyes: Negative.   Respiratory: Negative for cough, chest tightness, shortness of breath and wheezing.   Cardiovascular: Negative for chest pain, palpitations and leg swelling.  Gastrointestinal: Negative for abdominal pain, diarrhea, constipation and abdominal distention.  Musculoskeletal: Positive for arthralgias and gait problem.  Skin: Negative.   Neurological: Negative for dizziness and headaches.  Psychiatric/Behavioral: Negative.       Objective:   Physical Exam  Constitutional: She appears well-developed and well-nourished.  Obese  HENT:  Head: Normocephalic and atraumatic.  Eyes: EOM are normal.  Neck: Normal range of motion.  Cardiovascular: Normal rate and regular rhythm.   Pulmonary/Chest: Effort normal. No respiratory distress. She has no wheezes.  Breath sounds decreased due to body habitus  Abdominal: Soft. Bowel sounds are normal. She exhibits no distension. There is no tenderness. There is no rebound.  Musculoskeletal: She exhibits tenderness.  Both knees tender  Neurological: Coordination abnormal.  Difficult to walk with pain.  Vitals reviewed.  Filed Vitals:   06/13/14 0950  BP: 128/78  Pulse: 69  Temp: 97.9 F (36.6 C)  TempSrc: Oral  Resp: 16  Height: 5\' 3"  (1.6 m)  Weight: 300 lb 12.8 oz (136.442  kg)  SpO2: 96%      Assessment & Plan:

## 2014-06-15 NOTE — Assessment & Plan Note (Signed)
Needs to lose weight. Will try some pain medication to get her moving and able to exercise. She needs to fill out form for [employer] Try phentermine daily and return in 2-3 months for weight in. She understands that this is a short term solution to get her to knee surgery. Mobility is her biggest struggle.

## 2014-06-20 ENCOUNTER — Telehealth: Payer: Self-pay | Admitting: Internal Medicine

## 2014-06-20 NOTE — Telephone Encounter (Signed)
Dup message, See Rachel Bo message has been taken care of

## 2014-06-21 MED ORDER — CYCLOBENZAPRINE HCL 5 MG PO TABS
5.0000 mg | ORAL_TABLET | Freq: Three times a day (TID) | ORAL | Status: DC | PRN
Start: 2014-06-21 — End: 2015-01-28

## 2014-06-21 NOTE — Telephone Encounter (Signed)
Have sent in flexeril which is a muscle relaxer that should help for her pain. It has been sent to her pharmacy. She can take 1 pill up to 3 times per day.

## 2014-06-21 NOTE — Telephone Encounter (Signed)
Patient is asking for a replacement of   HYDROcodone-acetaminophen (NORCO/VICODIN) 5-325 MG per tablet [[id number]]      Because it makes her itch. She requests an RX with no codene. States that she spoke to you over the last two days, but I do not see any notation. Please call the patient.

## 2014-06-21 NOTE — Telephone Encounter (Signed)
Called pt no answer on (H) # called cell no answer LMOM with md response...[name]

## 2014-06-26 ENCOUNTER — Encounter: Payer: 59 | Attending: General Surgery | Admitting: Dietician

## 2014-06-26 DIAGNOSIS — Z6841 Body Mass Index (BMI) 40.0 and over, adult: Secondary | ICD-10-CM | POA: Diagnosis not present

## 2014-06-26 DIAGNOSIS — Z713 Dietary counseling and surveillance: Secondary | ICD-10-CM | POA: Insufficient documentation

## 2014-06-26 NOTE — Progress Notes (Signed)
  Medical Nutrition Therapy:  Appt start time: 4496 end time:  1150  SWL visit 3:  Primary concerns today:  Has lost another pound. Has just started taking Phentermine and she states that this helps curb her appetite. However, she is making sure to eat 3 meals a day (focusing on meat and vegetables). Also drinking more water. Has noticed that she gets full more quickly when she eats slowly. She is trying to walk a little bit but this is difficult due to pain.   Goal: Call [employer] to apply for insurance approval of protein shakes  Wt Readings from Last 3 Encounters:  06/26/14 295 lb 1.6 oz (133.856 kg)  06/13/14 300 lb 12.8 oz (136.442 kg)  05/30/14 296 lb 8 oz (134.492 kg)   Ht Readings from Last 3 Encounters:  06/13/14 5\' 3"  (1.6 m)  03/21/14 5\' 3"  (1.6 m)  03/18/14 5\' 3"  (1.6 m)   There is no weight on file to calculate BMI. @BMIFA @ Normalized weight-for-age data available only for age 48 to 47 years. Normalized stature-for-age data available only for age 48 to 34 years.   MEDICATIONS: see list  DIETARY INTAKE:  24-hr recall:  B ( AM): oatmeal  Snk ( AM) :  L ( PM): salad with chicken, cheese, eggs, onions Snk ( PM): frozen yogurt 1 cup D ( PM): baked chicken with greens or hotdog with [employer] bun Snk ( PM): popcorn (1/2 bag) or fruit  Beverages: water, juice  Recent physical activity: water aerobics previously, had to stop due to financial reasons  Estimated energy needs: 1500-1600 calories  Progress Towards Goal(s):  In progress.   Nutritional Diagnosis:  [place]-3.3 Overweight/obesity related to past poor dietary habits and physical inactivity as evidenced by patient w/ pending RYGB surgery following dietary guidelines for continued weight loss.     Intervention:  Nutrition counseling provided.  Monitoring/Evaluation:  Dietary intake, exercise, and body weight in 4 week(s).

## 2014-06-26 NOTE — Patient Instructions (Signed)
Goal: Contact [employer]: [phone] Ask to register to see if you can receive [employer] protein shakes

## 2014-07-24 ENCOUNTER — Encounter: Payer: 59 | Attending: General Surgery | Admitting: Dietician

## 2014-07-24 ENCOUNTER — Encounter (HOSPITAL_COMMUNITY): Payer: Self-pay

## 2014-07-24 ENCOUNTER — Inpatient Hospital Stay (HOSPITAL_COMMUNITY)
Admission: AD | Admit: 2014-07-24 | Discharge: 2014-07-24 | Disposition: A | Discharge status: Home / Self Care | Payer: 59 | Source: Ambulatory Visit | Attending: Obstetrics and Gynecology | Admitting: Obstetrics and Gynecology

## 2014-07-24 DIAGNOSIS — I10 Essential (primary) hypertension: Secondary | ICD-10-CM | POA: Diagnosis not present

## 2014-07-24 DIAGNOSIS — A6000 Herpesviral infection of urogenital system, unspecified: Secondary | ICD-10-CM

## 2014-07-24 DIAGNOSIS — A609 Anogenital herpesviral infection, unspecified: Secondary | ICD-10-CM | POA: Diagnosis not present

## 2014-07-24 DIAGNOSIS — Z79899 Other long term (current) drug therapy: Secondary | ICD-10-CM | POA: Diagnosis not present

## 2014-07-24 DIAGNOSIS — Z6841 Body Mass Index (BMI) 40.0 and over, adult: Secondary | ICD-10-CM | POA: Diagnosis not present

## 2014-07-24 DIAGNOSIS — Z713 Dietary counseling and surveillance: Secondary | ICD-10-CM | POA: Diagnosis not present

## 2014-07-24 DIAGNOSIS — J45909 Unspecified asthma, uncomplicated: Secondary | ICD-10-CM | POA: Insufficient documentation

## 2014-07-24 DIAGNOSIS — A599 Trichomoniasis, unspecified: Secondary | ICD-10-CM | POA: Diagnosis not present

## 2014-07-24 DIAGNOSIS — Z87891 Personal history of nicotine dependence: Secondary | ICD-10-CM | POA: Insufficient documentation

## 2014-07-24 LAB — URINALYSIS, ROUTINE W REFLEX MICROSCOPIC
Bilirubin Urine: NEGATIVE
GLUCOSE, UA: NEGATIVE mg/dL
Ketones, ur: NEGATIVE mg/dL
Nitrite: NEGATIVE
Protein, ur: 30 mg/dL — AB
SPECIFIC GRAVITY, URINE: 1.02 (ref 1.005–1.030)
Urobilinogen, UA: 0.2 mg/dL (ref 0.0–1.0)
pH: 6 (ref 5.0–8.0)

## 2014-07-24 LAB — WET PREP, GENITAL
Clue Cells Wet Prep HPF POC: NONE SEEN
Yeast Wet Prep HPF POC: NONE SEEN

## 2014-07-24 LAB — URINE MICROSCOPIC-ADD ON

## 2014-07-24 LAB — POCT PREGNANCY, URINE: Preg Test, Ur: NEGATIVE

## 2014-07-24 MED ORDER — METRONIDAZOLE 500 MG PO TABS
2000.0000 mg | ORAL_TABLET | Freq: Once | ORAL | Status: AC
  Administered 2014-07-24: 2000 mg via ORAL
  Filled 2014-07-24: qty 4

## 2014-07-24 MED ORDER — VALACYCLOVIR HCL 500 MG PO TABS: 500.0000 mg | ORAL_TABLET | Freq: Two times a day (BID) | ORAL | Status: DC

## 2014-07-24 NOTE — Discharge Instructions (Signed)
Genital Herpes °Genital herpes is a sexually transmitted disease. This means that it is a disease passed by having sex with an infected person. There is no cure for genital herpes. The time between attacks can be months to years. The virus may live in a person but produce no problems (symptoms). This infection can be passed to a baby as it travels down the birth canal (vagina). In a newborn, this can cause central nervous system damage, eye damage, or even death. The virus that causes genital herpes is usually HSV-2 virus. The virus that causes oral herpes is usually HSV-1. The diagnosis (learning what is wrong) is made through culture results. °SYMPTOMS  °Usually symptoms of pain and itching begin a few days to a week after contact. It first appears as small blisters that progress to small painful ulcers which then scab over and heal after several days. It affects the outer genitalia, birth canal, cervix, penis, anal area, buttocks, and thighs. °HOME CARE INSTRUCTIONS  °· Keep ulcerated areas dry and clean. °· Take medications as directed. Antiviral medications can speed up healing. They will not prevent recurrences or cure this infection. These medications can also be taken for suppression if there are frequent recurrences. °· While the infection is active, it is contagious. Avoid all sexual contact during active infections. °· Condoms may help prevent spread of the herpes virus. °· Practice safe sex. °· Wash your hands thoroughly after touching the genital area. °· Avoid touching your eyes after touching your genital area. °· Inform your caregiver if you have had genital herpes and become pregnant. It is your responsibility to insure a safe outcome for your baby in this pregnancy. °· Only take over-the-counter or prescription medicines for pain, discomfort, or fever as directed by your caregiver. °SEEK MEDICAL CARE IF:  °· You have a recurrence of this infection. °· You do not respond to medications and are not  improving. °· You have new sources of pain or discharge which have changed from the original infection. °· You have an oral temperature above 102° F (38.9° C). °· You develop abdominal pain. °· You develop eye pain or signs of eye infection. °Document Released: 03/28/2000 Document Revised: 06/23/2011 Document Reviewed: 04/18/2009 °ExitCare® Patient Information ©2015 [employer]. This information is not intended to replace advice given to you by your health care provider. Make sure you discuss any questions you have with your health care provider. ° °

## 2014-07-24 NOTE — MAU Provider Note (Signed)
History     CSN: [id number]  Arrival date and time: 07/24/14 0953   None     Chief Complaint  Patient presents with  . outbreak    HPI   [name] is a 59 y.o. female G1P1001 who presents with concerns about an outbreak; she has a history of genital herpes. She has been taking 1 tab of acyclovir per day for about 1 year. This outbreak started 2 weeks ago; and has not gotten any better despite taking the acyclovir. She has not tried any other medications for this, however feels that the acyclovir is not working.   Patient is married and has been married for 25 years; patient has had trichomonas twice in the last 2 years and states that she is not sexually active at this time.   OB History    Gravida Para Term Preterm AB TAB SAB Ectopic Multiple Living   1 1 1       1       Past Medical History  Diagnosis Date  . Asthma   . Hypertension   . Herpes   . Arthritis     Past Surgical History  Procedure Laterality Date  . Colonoscopy N/A 08/15/2013    Procedure: COLONOSCOPY;  Surgeon: Jerene Bears, MD;  Location: [facility];  Service: Gastroenterology;  Laterality: N/A;  . Breath tek h pylori N/A 02/13/2014    Procedure: BREATH TEK H PYLORI;  Surgeon: Gayland Curry, MD;  Location: [facility];  Service: General;  Laterality: N/A;  . Tubal ligation      Family History  Problem Relation Age of Onset  . Arthritis Mother   . Hypertension Mother   . Cancer Mother     pancreatic  . Pancreatic cancer Mother   . Arthritis Father   . Hypertension Father   . Colon cancer Neg Hx     History  Substance Use Topics  . Smoking status: Former Smoker    Quit date: 06/16/2004  . Smokeless tobacco: Never Used  . Alcohol Use: No    Allergies:  Allergies  Allergen Reactions  . Ace Inhibitors Swelling    Prescriptions prior to admission  Medication Sig Dispense Refill Last Dose  . acyclovir (ZOVIRAX) 200 MG capsule TAKE ONE CAPSULE BY MOUTH ONCE DAILY IN THE MORNING 30  capsule 3 07/24/2014 at Unknown time  . cyclobenzaprine (FLEXERIL) 5 MG tablet Take 1 tablet (5 mg total) by mouth 3 (three) times daily as needed for muscle spasms. 60 tablet 1 Past Month at Unknown time  . furosemide (LASIX) 40 MG tablet Take 1 tablet (40 mg total) by mouth daily. 90 tablet 1 07/23/2014 at Unknown time  . hydrochlorothiazide (HYDRODIURIL) 25 MG tablet Take 25 mg by mouth every morning.   07/24/2014 at Unknown time  . PROVENTIL HFA 108 (90 BASE) MCG/ACT inhaler Inhale 2 puffs into the lungs every 4 (four) hours as needed for wheezing or shortness of breath.    Past Week at Unknown time  . HYDROcodone-acetaminophen (NORCO/VICODIN) 5-325 MG per tablet Take 1 tablet by mouth 2 (two) times daily as needed for moderate pain. (Patient not taking: Reported on 07/24/2014) 60 tablet 0   . naproxen (NAPROSYN) 500 MG tablet Take 1 tablet (500 mg total) by mouth 2 (two) times daily with a meal. (Patient not taking: Reported on 07/24/2014) 10 tablet 0 Taking  . phentermine 37.5 MG capsule Take 1 capsule (37.5 mg total) by mouth every morning. (Patient not taking:  Reported on 07/24/2014) 30 capsule 2    Results for orders placed or performed during the hospital encounter of 07/24/14 (from the past 48 hour(s))  Urinalysis, Routine w reflex microscopic     Status: Abnormal   Collection Time: 07/24/14 10:15 AM  Result Value Ref Range   Color, Urine YELLOW YELLOW   APPearance HAZY (A) CLEAR   Specific Gravity, Urine 1.020 1.005 - 1.030   pH 6.0 5.0 - 8.0   Glucose, UA NEGATIVE NEGATIVE mg/dL   Hgb urine dipstick SMALL (A) NEGATIVE   Bilirubin Urine NEGATIVE NEGATIVE   Ketones, ur NEGATIVE NEGATIVE mg/dL   Protein, ur 30 (A) NEGATIVE mg/dL   Urobilinogen, UA 0.2 0.0 - 1.0 mg/dL   Nitrite NEGATIVE NEGATIVE   Leukocytes, UA MODERATE (A) NEGATIVE  Urine microscopic-add on     Status: Abnormal   Collection Time: 07/24/14 10:15 AM  Result Value Ref Range   Squamous Epithelial / LPF RARE RARE   WBC,  UA 21-50 <3 WBC/hpf   Bacteria, UA MANY (A) RARE   Urine-Other TRICHOMONAS PRESENT   Pregnancy, urine POC     Status: None   Collection Time: 07/24/14 10:26 AM  Result Value Ref Range   Preg Test, Ur NEGATIVE NEGATIVE    Comment:        THE SENSITIVITY OF THIS METHODOLOGY IS >24 mIU/mL   Wet prep, genital     Status: Abnormal   Collection Time: 07/24/14 11:29 AM  Result Value Ref Range   Yeast Wet Prep HPF POC NONE SEEN NONE SEEN   Trich, Wet Prep FEW (A) NONE SEEN   Clue Cells Wet Prep HPF POC NONE SEEN NONE SEEN   WBC, Wet Prep HPF POC MODERATE (A) NONE SEEN    Comment: MODERATE BACTERIA SEEN    Review of Systems  Constitutional: Negative for fever and chills.  Genitourinary: Negative for dysuria, urgency, frequency, hematuria and flank pain.   Physical Exam   Blood pressure 105/80, pulse 85, temperature 98 F (36.7 C), temperature source Oral, resp. rate 18, last menstrual period 07/21/2014.  Physical Exam  Constitutional: She is oriented to person, place, and time. She appears well-developed and well-nourished. No distress.  HENT:  Head: Normocephalic.  Respiratory: Effort normal.  Genitourinary:    There is tenderness in the vagina.  Vagina - Small amount of creamy, green discharge, strong odor, speculum exam deferred due to herpes outbreak; patients discomfort.  GC/Chlam, wet prep done Chaperone present for exam.  Musculoskeletal: Normal range of motion.  Neurological: She is alert and oriented to person, place, and time.  Skin: Skin is warm. She is not diaphoretic.  Psychiatric: Her behavior is normal.    MAU Course  Procedures  None  MDM  HIV pending  + trichomonas in urine; flagyl 2 grams given in MAU.   Urine culture pending; patient is asymptomatic   Assessment and Plan   A:  1. Trichomonas infection   2. Recurrent genital herpes    P;  Discharge home in stable condition RX: Valtrex Patient treated for trichomonas; patient needs  treatment Condoms always Follow up with PCP  Lezlie Lye, NP 07/24/2014 11:18 AM

## 2014-07-24 NOTE — Progress Notes (Signed)
  Medical Nutrition Therapy:  Appt start time: 784 end time:  784  SWL visit 4:  Primary concerns today:  Haly returns having lost 8 pounds in the last month. Still taking Phentermine. She eats 3 meals a day but tries not to snack between meals or eat at night. Has been watching portion sizes and avoiding sugar. She reports that she hopes to have bariatric surgery at the end of the year when her deductible is met.   Goal:  -Keep working on pre op goals -Sign up for insurance approval of protein shakes    Wt Readings from Last 3 Encounters:  06/26/14 295 lb 1.6 oz (133.856 kg)  06/13/14 300 lb 12.8 oz (136.442 kg)  05/30/14 296 lb 8 oz (134.492 kg)   Ht Readings from Last 3 Encounters:  06/13/14 $RemoveB'5\' 3"'EUChMeae$  (1.6 m)  03/21/14 $RemoveB'5\' 3"'gUjqZRIW$  (1.6 m)  03/18/14 $RemoveB'5\' 3"'SbOQDwVt$  (1.6 m)   There is no weight on file to calculate BMI. $RemoveBefore'@BMIFA'YwDytILRJiRzb$ @ Normalized weight-for-age data available only for age 56 to 38 years. Normalized stature-for-age data available only for age 56 to 53 years.   MEDICATIONS: see list  DIETARY INTAKE:  24-hr recall:  B ( AM): oatmeal  Snk ( AM) :  L ( PM): salad with chicken, cheese, eggs, onions Snk ( PM): frozen yogurt 1 cup D ( PM): baked chicken with greens or hotdog with [employer] bun Snk ( PM): popcorn (1/2 bag) or fruit  Beverages: water, juice  Recent physical activity: water aerobics previously, had to stop due to financial reasons  Estimated energy needs: 1500-1600 calories  Progress Towards Goal(s):  In progress.   Nutritional Diagnosis:  [place]-3.3 Overweight/obesity related to past poor dietary habits and physical inactivity as evidenced by patient w/ pending RYGB surgery following dietary guidelines for continued weight loss.     Intervention:  Nutrition counseling provided.  Monitoring/Evaluation:  Dietary intake, exercise, and body weight in 4 week(s).

## 2014-07-24 NOTE — MAU Note (Signed)
Hx of genital herpes, has had an outbreak for 2 weeks, is on valcyclovir but states it is not working, Has an appt in [place] [facility] here next week, but was advised to come to MAU.

## 2014-07-24 NOTE — MAU Note (Signed)
Pt states has swollen vagina, vaginal discharge, and odor since outbreak began 2 weeks ago. Takes acyclovir for suppression for past three years. Usually has outbreak right before menstrual cycles.

## 2014-07-25 ENCOUNTER — Other Ambulatory Visit: Payer: Self-pay | Admitting: Family

## 2014-07-25 LAB — URINE CULTURE
Colony Count: NO GROWTH
Culture: NO GROWTH
Special Requests: NORMAL

## 2014-07-25 LAB — GC/CHLAMYDIA PROBE AMP (~~LOC~~) NOT AT ARMC
Chlamydia: NEGATIVE
NEISSERIA GONORRHEA: NEGATIVE

## 2014-07-25 LAB — HIV ANTIBODY (ROUTINE TESTING W REFLEX): HIV SCREEN 4TH GENERATION: NONREACTIVE

## 2014-07-25 MED ORDER — ACYCLOVIR 200 MG PO CAPS: ORAL_CAPSULE | ORAL | Status: DC

## 2014-07-25 NOTE — Progress Notes (Signed)
Pt called requesting change RX for Valtrex to acyclovir due to cost.  RX for acyclovir 200 mg #90 called in to [facility].  RX will be $10.

## 2014-07-26 ENCOUNTER — Other Ambulatory Visit: Payer: Self-pay | Admitting: Internal Medicine

## 2014-07-26 DIAGNOSIS — Z1231 Encounter for screening mammogram for malignant neoplasm of breast: Secondary | ICD-10-CM

## 2014-07-27 ENCOUNTER — Ambulatory Visit: Payer: 59 | Admitting: Obstetrics & Gynecology

## 2014-08-07 ENCOUNTER — Other Ambulatory Visit: Payer: Self-pay | Admitting: Internal Medicine

## 2014-08-14 ENCOUNTER — Ambulatory Visit: Payer: 59 | Admitting: Internal Medicine

## 2014-08-21 ENCOUNTER — Ambulatory Visit: Payer: 59 | Admitting: Dietician

## 2014-08-22 ENCOUNTER — Encounter: Payer: 59 | Attending: General Surgery | Admitting: Dietician

## 2014-08-22 DIAGNOSIS — Z713 Dietary counseling and surveillance: Secondary | ICD-10-CM | POA: Diagnosis not present

## 2014-08-22 DIAGNOSIS — Z6841 Body Mass Index (BMI) 40.0 and over, adult: Secondary | ICD-10-CM | POA: Diagnosis not present

## 2014-08-22 NOTE — Progress Notes (Signed)
  Medical Nutrition Therapy:  Appt start time: 1140 end time:  1007  SWL visit 5:  Primary concerns today:  Linda Cabrera returns having lost 5 pounds in the last month. Still taking Phentermine. Plans to resume water aerobics. She states she has been working on chewing food thoroughly and avoiding sweets.  Goal:  -Keep working on pre op goals -Sign up for insurance approval of protein shakes   -Resume water aerobics  Wt Readings from Last 3 Encounters:  08/22/14 282 lb 9.6 oz (128.187 kg)  07/24/14 286 lb 12.8 oz (130.092 kg)  06/26/14 295 lb 1.6 oz (133.856 kg)   Ht Readings from Last 3 Encounters:  06/13/14 5\' 3"  (1.6 m)  03/21/14 5\' 3"  (1.6 m)  03/18/14 5\' 3"  (1.6 m)   There is no weight on file to calculate BMI. @BMIFA @ Normalized weight-for-age data available only for age 53 to 62 years. Normalized stature-for-age data available only for age 53 to 26 years.   MEDICATIONS: see list  DIETARY INTAKE:  24-hr recall:  B ( AM): oatmeal  Snk ( AM) :  L ( PM): salad with chicken, cheese, eggs, onions Snk ( PM): frozen yogurt 1 cup D ( PM): baked chicken with greens or hotdog with [employer] bun Snk ( PM): popcorn (1/2 bag) or fruit  Beverages: water, juice  Recent physical activity: water aerobics previously, had to stop due to financial reasons  Estimated energy needs: 1500-1600 calories  Progress Towards Goal(s):  In progress.   Nutritional Diagnosis:  [place]-3.3 Overweight/obesity related to past poor dietary habits and physical inactivity as evidenced by patient w/ pending RYGB surgery following dietary guidelines for continued weight loss.     Intervention:  Nutrition counseling provided.  Monitoring/Evaluation:  Dietary intake, exercise, and body weight in 4 week(s).

## 2014-08-24 ENCOUNTER — Other Ambulatory Visit (INDEPENDENT_AMBULATORY_CARE_PROVIDER_SITE_OTHER): Payer: 59

## 2014-08-24 ENCOUNTER — Encounter: Payer: Self-pay | Admitting: Internal Medicine

## 2014-08-24 ENCOUNTER — Telehealth: Payer: Self-pay | Admitting: Family Medicine

## 2014-08-24 ENCOUNTER — Ambulatory Visit (INDEPENDENT_AMBULATORY_CARE_PROVIDER_SITE_OTHER): Payer: 59 | Admitting: Internal Medicine

## 2014-08-24 LAB — COMPREHENSIVE METABOLIC PANEL
ALBUMIN: 3.7 g/dL (ref 3.5–5.2)
ALT: 11 U/L (ref 0–35)
AST: 16 U/L (ref 0–37)
Alkaline Phosphatase: 83 U/L (ref 39–117)
BUN: 17 mg/dL (ref 6–23)
CHLORIDE: 98 meq/L (ref 96–112)
CO2: 32 meq/L (ref 19–32)
CREATININE: 0.93 mg/dL (ref 0.40–1.20)
Calcium: 9.5 mg/dL (ref 8.4–10.5)
GFR: 79.8 mL/min (ref >60.00)
Glucose, Bld: 108 mg/dL — ABNORMAL HIGH (ref 70–99)
Potassium: 2.9 mEq/L — ABNORMAL LOW (ref 3.5–5.1)
Sodium: 137 mEq/L (ref 135–145)
Total Bilirubin: 0.3 mg/dL (ref 0.2–1.2)
Total Protein: 7.8 g/dL (ref 6.0–8.3)

## 2014-08-24 LAB — FOLATE: FOLATE: 9.6 ng/mL (ref >5.9)

## 2014-08-24 LAB — HEMOGLOBIN A1C: HEMOGLOBIN A1C: 6 % (ref 4.6–6.5)

## 2014-08-24 LAB — VITAMIN B12: Vitamin B-12: 474 pg/mL (ref 211–911)

## 2014-08-24 MED ORDER — DICLOFENAC SODIUM 75 MG PO TBEC: 75.0000 mg | DELAYED_RELEASE_TABLET | Freq: Two times a day (BID) | ORAL | Status: DC

## 2014-08-24 MED ORDER — PHENTERMINE HCL 37.5 MG PO CAPS: 37.5000 mg | ORAL_CAPSULE | ORAL | Status: DC

## 2014-08-24 NOTE — Progress Notes (Signed)
Pre visit review using our clinic review tool, if applicable. No additional management support is needed unless otherwise documented below in the visit note. 

## 2014-08-24 NOTE — Telephone Encounter (Signed)
Patient has appointment on 5/26 with Dr. Tamala Julian at 11:00am.  Wanted to make sure she does not need a [facility] Referral.  Provider is Dr. Doug Sou.

## 2014-08-24 NOTE — Patient Instructions (Signed)
We will get you in with the sports medicine doctor for the pain in the feet. We have given you a medicine which should work on the inflammation in the tendons of the foot called voltaren. Take 1 pill twice a day to help with the pain.   We are checking on the blood today to make sure we are not missing anything.  We have refilled the phentermine which you can take for another 4 months. After that we are not able to keep you on it. Work on increasing exercise and working on diet so that you do not regain the weight after stopping the medicine.  Plantar Fasciitis (Heel Spur Syndrome) with Rehab The plantar fascia is a fibrous, ligament-like, soft-tissue structure that spans the bottom of the foot. Plantar fasciitis is a condition that causes pain in the foot due to inflammation of the tissue. SYMPTOMS   Pain and tenderness on the underneath side of the foot.  Pain that worsens with standing or walking. CAUSES  Plantar fasciitis is caused by irritation and injury to the plantar fascia on the underneath side of the foot. Common mechanisms of injury include:  Direct trauma to bottom of the foot.  Damage to a small nerve that runs under the foot where the main fascia attaches to the heel bone.  Stress placed on the plantar fascia due to bone spurs. RISK INCREASES WITH:   Activities that place stress on the plantar fascia (running, jumping, pivoting, or cutting).  Poor strength and flexibility.  Improperly fitted shoes.  Tight calf muscles.  Flat feet.  Failure to warm-up properly before activity.  Obesity. PREVENTION  Warm up and stretch properly before activity.  Allow for adequate recovery between workouts.  Maintain physical fitness:  Strength, flexibility, and endurance.  Cardiovascular fitness.  Maintain a health body weight.  Avoid stress on the plantar fascia.  Wear properly fitted shoes, including arch supports for individuals who have flat feet. PROGNOSIS  If  treated properly, then the symptoms of plantar fasciitis usually resolve without surgery. However, occasionally surgery is necessary. RELATED COMPLICATIONS   Recurrent symptoms that may result in a chronic condition.  Problems of the lower back that are caused by compensating for the injury, such as limping.  Pain or weakness of the foot during push-off following surgery.  Chronic inflammation, scarring, and partial or complete fascia tear, occurring more often from repeated injections. TREATMENT  Treatment initially involves the use of ice and medication to help reduce pain and inflammation. The use of strengthening and stretching exercises may help reduce pain with activity, especially stretches of the Achilles tendon. These exercises may be performed at home or with a therapist. Your caregiver may recommend that you use heel cups of arch supports to help reduce stress on the plantar fascia. Occasionally, corticosteroid injections are given to reduce inflammation. If symptoms persist for greater than 6 months despite non-surgical (conservative), then surgery may be recommended.  MEDICATION   If pain medication is necessary, then nonsteroidal anti-inflammatory medications, such as aspirin and ibuprofen, or other minor pain relievers, such as acetaminophen, are often recommended.  Do not take pain medication within 7 days before surgery.  Prescription pain relievers may be given if deemed necessary by your caregiver. Use only as directed and only as much as you need.  Corticosteroid injections may be given by your caregiver. These injections should be reserved for the most serious cases, because they may only be given a certain number of times. HEAT AND COLD  Cold treatment (icing) relieves pain and reduces inflammation. Cold treatment should be applied for 10 to 15 minutes every 2 to 3 hours for inflammation and pain and immediately after any activity that aggravates your symptoms. Use ice  packs or massage the area with a piece of ice (ice massage).  Heat treatment may be used prior to performing the stretching and strengthening activities prescribed by your caregiver, physical therapist, or athletic trainer. Use a heat pack or soak the injury in warm water. SEEK IMMEDIATE MEDICAL CARE IF:  Treatment seems to offer no benefit, or the condition worsens.  Any medications produce adverse side effects. EXERCISES RANGE OF MOTION (ROM) AND STRETCHING EXERCISES - Plantar Fasciitis (Heel Spur Syndrome) These exercises may help you when beginning to rehabilitate your injury. Your symptoms may resolve with or without further involvement from your physician, physical therapist or athletic trainer. While completing these exercises, remember:   Restoring tissue flexibility helps normal motion to return to the joints. This allows healthier, less painful movement and activity.  An effective stretch should be held for at least 30 seconds.  A stretch should never be painful. You should only feel a gentle lengthening or release in the stretched tissue. RANGE OF MOTION - Toe Extension, Flexion  Sit with your right / left leg crossed over your opposite knee.  Grasp your toes and gently pull them back toward the top of your foot. You should feel a stretch on the bottom of your toes and/or foot.  Hold this stretch for __________ seconds.  Now, gently pull your toes toward the bottom of your foot. You should feel a stretch on the top of your toes and or foot.  Hold this stretch for __________ seconds. Repeat __________ times. Complete this stretch __________ times per day.  RANGE OF MOTION - Ankle Dorsiflexion, Active Assisted  Remove shoes and sit on a chair that is preferably not on a carpeted surface.  Place right / left foot under knee. Extend your opposite leg for support.  Keeping your heel down, slide your right / left foot back toward the chair until you feel a stretch at your  ankle or calf. If you do not feel a stretch, slide your bottom forward to the edge of the chair, while still keeping your heel down.  Hold this stretch for __________ seconds. Repeat __________ times. Complete this stretch __________ times per day.  STRETCH - Gastroc, Standing  Place hands on wall.  Extend right / left leg, keeping the front knee somewhat bent.  Slightly point your toes inward on your back foot.  Keeping your right / left heel on the floor and your knee straight, shift your weight toward the wall, not allowing your back to arch.  You should feel a gentle stretch in the right / left calf. Hold this position for __________ seconds. Repeat __________ times. Complete this stretch __________ times per day. STRETCH - Soleus, Standing  Place hands on wall.  Extend right / left leg, keeping the other knee somewhat bent.  Slightly point your toes inward on your back foot.  Keep your right / left heel on the floor, bend your back knee, and slightly shift your weight over the back leg so that you feel a gentle stretch deep in your back calf.  Hold this position for __________ seconds. Repeat __________ times. Complete this stretch __________ times per day. STRETCH - Gastrocsoleus, Standing  Note: This exercise can place a lot of stress on your foot and ankle.  Please complete this exercise only if specifically instructed by your caregiver.   Place the ball of your right / left foot on a step, keeping your other foot firmly on the same step.  Hold on to the wall or a rail for balance.  Slowly lift your other foot, allowing your body weight to press your heel down over the edge of the step.  You should feel a stretch in your right / left calf.  Hold this position for __________ seconds.  Repeat this exercise with a slight bend in your right / left knee. Repeat __________ times. Complete this stretch __________ times per day.  STRENGTHENING EXERCISES - Plantar Fasciitis  (Heel Spur Syndrome)  These exercises may help you when beginning to rehabilitate your injury. They may resolve your symptoms with or without further involvement from your physician, physical therapist or athletic trainer. While completing these exercises, remember:   Muscles can gain both the endurance and the strength needed for everyday activities through controlled exercises.  Complete these exercises as instructed by your physician, physical therapist or athletic trainer. Progress the resistance and repetitions only as guided. STRENGTH - Towel Curls  Sit in a chair positioned on a non-carpeted surface.  Place your foot on a towel, keeping your heel on the floor.  Pull the towel toward your heel by only curling your toes. Keep your heel on the floor.  If instructed by your physician, physical therapist or athletic trainer, add ____________________ at the end of the towel. Repeat __________ times. Complete this exercise __________ times per day. STRENGTH - Ankle Inversion  Secure one end of a rubber exercise band/tubing to a fixed object (table, pole). Loop the other end around your foot just before your toes.  Place your fists between your knees. This will focus your strengthening at your ankle.  Slowly, pull your big toe up and in, making sure the band/tubing is positioned to resist the entire motion.  Hold this position for __________ seconds.  Have your muscles resist the band/tubing as it slowly pulls your foot back to the starting position. Repeat __________ times. Complete this exercises __________ times per day.  Document Released: 03/31/2005 Document Revised: 06/23/2011 Document Reviewed: 07/13/2008 [facility] Patient Information 2015 [place], [place]. This information is not intended to replace advice given to you by your health care provider. Make sure you discuss any questions you have with your health care provider.

## 2014-08-24 NOTE — Telephone Encounter (Signed)
[employer] ref # [id number] valid 08/24/14-02/24/15 for 6 visits

## 2014-08-25 NOTE — Assessment & Plan Note (Signed)
She is down about 10 pounds since last visit and congratulated her. She does need to work on diet as otherwise she will be highly likely to regain after stopping phentermine. Refill her phentermine today for 3 more months and see her back after that. Encouraged her to talk to nutritionist but she did not feel necessary.

## 2014-08-25 NOTE — Progress Notes (Signed)
   Subjective:    Patient ID: Linda Cabrera, female    DOB: Sep 16, 1955, 59 y.o.   MRN: [id number]  HPI The patient is a 59 YO female who is coming in to follow up on her weights. She has started phentermine and is down several pounds. She is having pain in her knee and ankles still from the weight. She is seeing orthopedics but they want her to keep working on weight loss before they will operate on her knee. No new problems. Flexeril did not help.   Review of Systems  Constitutional: Negative for chills, activity change, appetite change and fatigue.  HENT: Negative.   Eyes: Negative.   Respiratory: Negative for cough, chest tightness, shortness of breath and wheezing.   Cardiovascular: Negative for chest pain, palpitations and leg swelling.  Gastrointestinal: Negative for abdominal pain, diarrhea, constipation and abdominal distention.  Musculoskeletal: Positive for arthralgias and gait problem.  Skin: Negative.   Neurological: Negative for dizziness and headaches.  Psychiatric/Behavioral: Negative.       Objective:   Physical Exam  Constitutional: She appears well-developed and well-nourished.  Obese  HENT:  Head: Normocephalic and atraumatic.  Eyes: EOM are normal.  Neck: Normal range of motion.  Cardiovascular: Normal rate and regular rhythm.   Pulmonary/Chest: Effort normal. No respiratory distress. She has no wheezes.  Breath sounds decreased due to body habitus  Abdominal: Soft. Bowel sounds are normal. She exhibits no distension. There is no tenderness. There is no rebound.  Musculoskeletal: She exhibits tenderness.  Both knees tender  Neurological: Coordination abnormal.  Difficult to walk with pain.  Vitals reviewed.  Filed Vitals:   08/24/14 1038  BP: 106/68  Pulse: 78  Temp: 98.3 F (36.8 C)  TempSrc: Oral  Resp: 16  Weight: 284 lb 6.4 oz (129.003 kg)  SpO2: 98%      Assessment & Plan:

## 2014-08-29 ENCOUNTER — Other Ambulatory Visit: Payer: Self-pay | Admitting: Internal Medicine

## 2014-08-29 MED ORDER — TRIAMTERENE-HCTZ 37.5-25 MG PO TABS: 1.0000 | ORAL_TABLET | Freq: Every day | ORAL | Status: DC

## 2014-08-29 MED ORDER — POTASSIUM CHLORIDE ER 20 MEQ PO TBCR: 20.0000 meq | EXTENDED_RELEASE_TABLET | Freq: Every day | ORAL | Status: DC

## 2014-09-06 ENCOUNTER — Ambulatory Visit (HOSPITAL_COMMUNITY)
Admission: RE | Admit: 2014-09-06 | Discharge: 2014-09-06 | Disposition: A | Discharge status: Home / Self Care | Payer: 59 | Source: Ambulatory Visit | Attending: Internal Medicine | Admitting: Internal Medicine

## 2014-09-06 DIAGNOSIS — Z1231 Encounter for screening mammogram for malignant neoplasm of breast: Secondary | ICD-10-CM | POA: Diagnosis not present

## 2014-09-07 ENCOUNTER — Ambulatory Visit: Payer: 59 | Admitting: Family Medicine

## 2014-09-20 ENCOUNTER — Encounter: Payer: Self-pay | Admitting: Dietician

## 2014-09-20 ENCOUNTER — Encounter: Payer: 59 | Attending: General Surgery | Admitting: Dietician

## 2014-09-20 DIAGNOSIS — Z713 Dietary counseling and surveillance: Secondary | ICD-10-CM | POA: Insufficient documentation

## 2014-09-20 DIAGNOSIS — Z6841 Body Mass Index (BMI) 40.0 and over, adult: Secondary | ICD-10-CM | POA: Diagnosis not present

## 2014-09-20 NOTE — Patient Instructions (Signed)
  Contact [employer]: [phone] Ask to register to see if you can receive Premier protein shakes  To "cleanse" try Miralax, drink plenty of water (try adding lemon), fill up on non-starchy vegetables and high fiber foods like oatmeal and beans

## 2014-09-20 NOTE — Progress Notes (Signed)
  Medical Nutrition Therapy:  Appt start time: 915 end time:  930  SWL visit 6:  Primary concerns today:  Santina returns having lost another 6 pounds in the last month. Still taking Phentermine. Plans to resume water aerobics. She states she has been working on chewing food thoroughly and avoiding sweets. Jatziri states that she is interested in a cleanse due to constipation issues. She reports she may go 4 days without a bowel movement.   Goal:  -Keep working on pre op goals -Sign up for insurance approval of protein shakes   -Resume water aerobics -Increase water and fiber intake; try Miralax   Wt Readings from Last 3 Encounters:  09/20/14 276 lb 6.4 oz (125.374 kg)  08/24/14 284 lb 6.4 oz (129.003 kg)  08/22/14 282 lb 9.6 oz (128.187 kg)   Ht Readings from Last 3 Encounters:  06/13/14 5\' 3"  (1.6 m)  03/21/14 5\' 3"  (1.6 m)  03/18/14 5\' 3"  (1.6 m)   There is no weight on file to calculate BMI. @BMIFA @ Normalized weight-for-age data available only for age 40 to 83 years. Normalized stature-for-age data available only for age 40 to 82 years.   MEDICATIONS: see list  DIETARY INTAKE:  24-hr recall:  B ( AM):   Snk ( AM) :  L ( PM): 2 slices of pizza, water Snk ( PM):  D ( PM): fish with turnip greens Snk ( PM):   Beverages: water, juice  Recent physical activity: water aerobics previously, had to stop due to financial reasons  Estimated energy needs: 1500-1600 calories  Progress Towards Goal(s):  In progress.   Nutritional Diagnosis:  [place]-3.3 Overweight/obesity related to past poor dietary habits and physical inactivity as evidenced by patient w/ pending RYGB surgery following dietary guidelines for continued weight loss.     Intervention:  Nutrition counseling provided.  Monitoring/Evaluation:  Dietary intake, exercise, and body weight in 4 week(s).

## 2014-12-07 ENCOUNTER — Emergency Department (HOSPITAL_COMMUNITY)
Admission: EM | Admit: 2014-12-07 | Discharge: 2014-12-07 | Disposition: A | Discharge status: Home / Self Care | Payer: 59 | Attending: Emergency Medicine | Admitting: Emergency Medicine

## 2014-12-07 ENCOUNTER — Encounter (HOSPITAL_COMMUNITY): Payer: Self-pay | Admitting: Emergency Medicine

## 2014-12-07 DIAGNOSIS — L03115 Cellulitis of right lower limb: Secondary | ICD-10-CM | POA: Diagnosis not present

## 2014-12-07 DIAGNOSIS — Z87891 Personal history of nicotine dependence: Secondary | ICD-10-CM | POA: Insufficient documentation

## 2014-12-07 DIAGNOSIS — R2241 Localized swelling, mass and lump, right lower limb: Secondary | ICD-10-CM | POA: Diagnosis present

## 2014-12-07 DIAGNOSIS — Z8619 Personal history of other infectious and parasitic diseases: Secondary | ICD-10-CM | POA: Insufficient documentation

## 2014-12-07 DIAGNOSIS — M1990 Unspecified osteoarthritis, unspecified site: Secondary | ICD-10-CM | POA: Diagnosis not present

## 2014-12-07 DIAGNOSIS — Z79899 Other long term (current) drug therapy: Secondary | ICD-10-CM | POA: Insufficient documentation

## 2014-12-07 DIAGNOSIS — J45909 Unspecified asthma, uncomplicated: Secondary | ICD-10-CM | POA: Diagnosis not present

## 2014-12-07 DIAGNOSIS — I10 Essential (primary) hypertension: Secondary | ICD-10-CM | POA: Diagnosis not present

## 2014-12-07 DIAGNOSIS — Z791 Long term (current) use of non-steroidal anti-inflammatories (NSAID): Secondary | ICD-10-CM | POA: Insufficient documentation

## 2014-12-07 MED ORDER — CEPHALEXIN 500 MG PO CAPS: 1000.0000 mg | ORAL_CAPSULE | Freq: Two times a day (BID) | ORAL | Status: DC

## 2014-12-07 NOTE — ED Provider Notes (Signed)
CSN: [id number]   Arrival date & time 12/07/14 1219  History  This chart was scribed for non-physician practitioner, Domenic Moras PA-C , working with Jola Schmidt, MD by Altamease Oiler, ED Scribe. This patient was seen in room WTR6/WTR6 and the patient's care was started at 1:44 PM.  Chief Complaint  Patient presents with  . knot on upper thigh     HPI The history is provided by the patient. No language interpreter was used.   Linda Cabrera is a 59 y.o. female who presents to the Emergency Department complaining of an atraumatic area of increasing pain and swelling at the right thigh with onset 1 month ago. The area is dark and hard to the touch.She states that initially the area looked like a small bruise but has grown in size and worsened in pain. Pt describes the pain as aching that is worse with laying on the right side. Pt denies fever. She has been unable to get in to see her PCP.  No prior hx of PE/DVT, no recent surgery, prolonged bed rest, taking oral hormone or having active cancer.  No specific treatment tried.  Past Medical History  Diagnosis Date  . Asthma   . Hypertension   . Herpes   . Arthritis     Past Surgical History  Procedure Laterality Date  . Colonoscopy N/A 08/15/2013    Procedure: COLONOSCOPY;  Surgeon: Jerene Bears, MD;  Location: [facility];  Service: Gastroenterology;  Laterality: N/A;  . Breath tek h pylori N/A 02/13/2014    Procedure: BREATH TEK H PYLORI;  Surgeon: Gayland Curry, MD;  Location: [facility];  Service: General;  Laterality: N/A;  . Tubal ligation      Family History  Problem Relation Age of Onset  . Arthritis Mother   . Hypertension Mother   . Cancer Mother     pancreatic  . Pancreatic cancer Mother   . Arthritis Father   . Hypertension Father   . Colon cancer Neg Hx     Social History  Substance Use Topics  . Smoking status: Former Smoker    Quit date: 06/16/2004  . Smokeless tobacco: Never Used  . Alcohol Use: No     Review  of Systems  Constitutional: Negative for fever.  Musculoskeletal:       An area of pain, swelling, and hyperpigmentation at the right thigh    Home Medications   Prior to Admission medications   Medication Sig Start Date End Date Taking? Authorizing Provider  acyclovir (ZOVIRAX) 200 MG capsule TAKE ONE CAPSULE BY MOUTH ONCE DAILY IN THE MORNING 07/25/14   Gwen Pounds, CNM  cyclobenzaprine (FLEXERIL) 5 MG tablet Take 1 tablet (5 mg total) by mouth 3 (three) times daily as needed for muscle spasms. Patient not taking: Reported on 08/24/2014 06/21/14   Olga Millers, MD  diclofenac (VOLTAREN) 75 MG EC tablet Take 1 tablet (75 mg total) by mouth 2 (two) times daily. 08/24/14   Olga Millers, MD  furosemide (LASIX) 40 MG tablet Take 1 tablet (40 mg total) by mouth daily. 06/13/14   Olga Millers, MD  phentermine 37.5 MG capsule Take 1 capsule (37.5 mg total) by mouth every morning. 08/24/14   Olga Millers, MD  potassium chloride 20 MEQ TBCR Take 20 mEq by mouth daily. 08/29/14   Olga Millers, MD  PROVENTIL HFA [phone] BASE) MCG/ACT inhaler Inhale 2 puffs into the lungs every 4 (four) hours as needed  for wheezing or shortness of breath.  08/17/13   Historical Provider, MD  triamterene-hydrochlorothiazide (MAXZIDE-25) 37.5-25 MG per tablet Take 1 tablet by mouth daily. 08/29/14   Olga Millers, MD    Allergies  Ace inhibitors  Triage Vitals: BP 135/86 mmHg  Pulse 79  Temp(Src) 97.7 F (36.5 C) (Oral)  Resp 20  SpO2 99%  Physical Exam  Constitutional: She is oriented to person, place, and time. She appears well-developed and well-nourished.  HENT:  Head: Normocephalic.  Eyes: EOM are normal.  Neck: Normal range of motion.  Pulmonary/Chest: Effort normal.  Abdominal: She exhibits no distension.  Musculoskeletal: Normal range of motion.  An area of induration measuring 2X5 cm that is firm to the touch and tender with faintly hyperpigmented skin and mild erythema  at the right lateral thigh  Neurological: She is alert and oriented to person, place, and time.  Psychiatric: She has a normal mood and affect.  Nursing note and vitals reviewed.   ED Course  Procedures  DIAGNOSTIC STUDIES: Oxygen Saturation is 99% on RA,  normal by my interpretation.    COORDINATION OF CARE: 1:47 PM a hyperpigmented area that is indurated without fluctuance.  There's characteristic to suggest superficial infection.  Doubt abscess because of the duration.  Doubt DVT or cancer because low risk factors.  Will give abx and recommend close f/u with pcp. Discussed treatment plan with pt at bedside and pt agreed to plan.  Labs Review- Labs Reviewed - No data to display  Imaging Review No results found.  I, Domenic Moras PA-C, personally reviewed and evaluated these images and lab results as part of my medical decision-making.   EKG Interpretation None   MDM   Final diagnoses:  Cellulitis of right thigh   BP 135/86 mmHg  Pulse 79  Temp(Src) 97.7 F (36.5 C) (Oral)  Resp 20  SpO2 99%    I personally performed the services described in this documentation, which was scribed in my presence. The recorded information has been reviewed and is accurate.       Domenic Moras, PA-C 12/07/14 1406  Jola Schmidt, MD 12/07/14 [phone]

## 2014-12-07 NOTE — ED Notes (Signed)
Per pt, states hard place about a quarter size on upper right hip-noticed a few months ago

## 2014-12-07 NOTE — Discharge Instructions (Signed)
Take antibiotic as prescribed for the full duration.  Follow up with your doctor next week for a recheck.  Return to ER if your condition worsen or if you have other concerns.  Cellulitis Cellulitis is an infection of the skin and the tissue beneath it. The infected area is usually red and tender. Cellulitis occurs most often in the arms and lower legs.  CAUSES  Cellulitis is caused by bacteria that enter the skin through cracks or cuts in the skin. The most common types of bacteria that cause cellulitis are staphylococci and streptococci. SIGNS AND SYMPTOMS   Redness and warmth.  Swelling.  Tenderness or pain.  Fever. DIAGNOSIS  Your health care provider can usually determine what is wrong based on a physical exam. Blood tests may also be done. TREATMENT  Treatment usually involves taking an antibiotic medicine. HOME CARE INSTRUCTIONS   Take your antibiotic medicine as directed by your health care provider. Finish the antibiotic even if you start to feel better.  Keep the infected arm or leg elevated to reduce swelling.  Apply a warm cloth to the affected area up to 4 times per day to relieve pain.  Take medicines only as directed by your health care provider.  Keep all follow-up visits as directed by your health care provider. SEEK MEDICAL CARE IF:   You notice red streaks coming from the infected area.  Your red area gets larger or turns dark in color.  Your bone or joint underneath the infected area becomes painful after the skin has healed.  Your infection returns in the same area or another area.  You notice a swollen bump in the infected area.  You develop new symptoms.  You have a fever. SEEK IMMEDIATE MEDICAL CARE IF:   You feel very sleepy.  You develop vomiting or diarrhea.  You have a general ill feeling (malaise) with muscle aches and pains. MAKE SURE YOU:   Understand these instructions.  Will watch your condition.  Will get help right away if  you are not doing well or get worse. Document Released: 01/08/2005 Document Revised: 08/15/2013 Document Reviewed: 06/16/2011 [facility] Patient Information 2015 [place], [place]. This information is not intended to replace advice given to you by your health care provider. Make sure you discuss any questions you have with your health care provider.

## 2015-01-24 ENCOUNTER — Other Ambulatory Visit (INDEPENDENT_AMBULATORY_CARE_PROVIDER_SITE_OTHER): Payer: 59

## 2015-01-24 ENCOUNTER — Encounter: Payer: Self-pay | Admitting: Internal Medicine

## 2015-01-24 ENCOUNTER — Ambulatory Visit (INDEPENDENT_AMBULATORY_CARE_PROVIDER_SITE_OTHER): Payer: 59 | Admitting: Internal Medicine

## 2015-01-24 LAB — BASIC METABOLIC PANEL
BUN: 20 mg/dL (ref 6–23)
CO2: 34 mEq/L — ABNORMAL HIGH (ref 19–32)
CREATININE: 1.1 mg/dL (ref 0.40–1.20)
Calcium: 9.9 mg/dL (ref 8.4–10.5)
Chloride: 97 mEq/L (ref 96–112)
GFR: 65.65 mL/min (ref >60.00)
Glucose, Bld: 106 mg/dL — ABNORMAL HIGH (ref 70–99)
Potassium: 3.2 mEq/L — ABNORMAL LOW (ref 3.5–5.1)
Sodium: 140 mEq/L (ref 135–145)

## 2015-01-24 LAB — HEMOGLOBIN A1C: HEMOGLOBIN A1C: 6 % (ref 4.6–6.5)

## 2015-01-24 MED ORDER — CEPHALEXIN 500 MG PO CAPS: 500.0000 mg | ORAL_CAPSULE | Freq: Three times a day (TID) | ORAL | Status: DC

## 2015-01-24 MED ORDER — ACYCLOVIR 200 MG PO CAPS: ORAL_CAPSULE | ORAL | Status: DC

## 2015-01-24 NOTE — Progress Notes (Signed)
Pre visit review using our clinic review tool, if applicable. No additional management support is needed unless otherwise documented below in the visit note. 

## 2015-01-24 NOTE — Patient Instructions (Signed)
We have given you the prescription for the acyclovir to have.   We have also given you a prescription for the keflex which is for the leg infection that you take 1 pill 3 times per day for 1 week.   Keep up the good work with weight loss!  Come back in about 6 months to check on the weight.

## 2015-01-28 NOTE — Assessment & Plan Note (Signed)
Weight down about 20 pounds since last visit. Refill of her phentermine today. She is still working on more weight loss. Noticing the benefit in her joints and talked to her again about regular exercise to help with her weight.

## 2015-01-28 NOTE — Progress Notes (Signed)
   Subjective:    Patient ID: Linda Cabrera, female    DOB: [birth year], 59 y.o.   MRN: [id number]  HPI The patient is a 59 YO female coming in for follow up of her weight. She has been working on diet and not exercising much. She knows that carbs are bad for her weight. She has struggled with this much of her life and been on many different diets. She thinks that she knows how to diet but has a hard time with everyday making good choices. She is down about 20 pounds since last visit and having less pain in her joints.   Review of Systems  Constitutional: Negative for chills, activity change, appetite change and fatigue.  HENT: Negative.   Eyes: Negative.   Respiratory: Negative for cough, chest tightness, shortness of breath and wheezing.   Cardiovascular: Negative for chest pain, palpitations and leg swelling.  Gastrointestinal: Negative for abdominal pain, diarrhea, constipation and abdominal distention.  Musculoskeletal: Positive for arthralgias and gait problem.  Skin: Negative.   Neurological: Negative for dizziness and headaches.  Psychiatric/Behavioral: Negative.       Objective:   Physical Exam  Constitutional: She appears well-developed and well-nourished.  Obese  HENT:  Head: Normocephalic and atraumatic.  Eyes: EOM are normal.  Neck: Normal range of motion.  Cardiovascular: Normal rate and regular rhythm.   Pulmonary/Chest: Effort normal. No respiratory distress. She has no wheezes.  Breath sounds decreased due to body habitus  Abdominal: Soft. Bowel sounds are normal. She exhibits no distension. There is no tenderness. There is no rebound.  Musculoskeletal: She exhibits tenderness.  Both knees tender  Neurological: Coordination abnormal.  Difficult to walk with pain.  Vitals reviewed.  Filed Vitals:   01/24/15 1009  BP: 118/88  Pulse: 82  Temp: 98.3 F (36.8 C)  TempSrc: Oral  Resp: 16  Height: 5\' 3"  (1.6 m)  Weight: 265 lb 9.6 oz (120.475 kg)  SpO2: 97%       Assessment & Plan:

## 2015-03-17 ENCOUNTER — Other Ambulatory Visit: Payer: Self-pay | Admitting: Internal Medicine

## 2015-05-01 ENCOUNTER — Observation Stay (HOSPITAL_COMMUNITY)
Admission: EM | Admit: 2015-05-01 | Discharge: 2015-05-03 | Disposition: A | Discharge status: Home / Self Care | Payer: BLUE CROSS/BLUE SHIELD | Attending: Internal Medicine | Admitting: Internal Medicine

## 2015-05-01 ENCOUNTER — Encounter (HOSPITAL_COMMUNITY): Payer: Self-pay | Admitting: Emergency Medicine

## 2015-05-01 DIAGNOSIS — R001 Bradycardia, unspecified: Secondary | ICD-10-CM | POA: Insufficient documentation

## 2015-05-01 DIAGNOSIS — J4520 Mild intermittent asthma, uncomplicated: Secondary | ICD-10-CM | POA: Insufficient documentation

## 2015-05-01 DIAGNOSIS — N289 Disorder of kidney and ureter, unspecified: Secondary | ICD-10-CM | POA: Diagnosis not present

## 2015-05-01 DIAGNOSIS — Z87891 Personal history of nicotine dependence: Secondary | ICD-10-CM | POA: Insufficient documentation

## 2015-05-01 DIAGNOSIS — R739 Hyperglycemia, unspecified: Secondary | ICD-10-CM | POA: Insufficient documentation

## 2015-05-01 DIAGNOSIS — R51 Headache: Secondary | ICD-10-CM | POA: Insufficient documentation

## 2015-05-01 DIAGNOSIS — I959 Hypotension, unspecified: Secondary | ICD-10-CM | POA: Insufficient documentation

## 2015-05-01 DIAGNOSIS — Z8249 Family history of ischemic heart disease and other diseases of the circulatory system: Secondary | ICD-10-CM | POA: Insufficient documentation

## 2015-05-01 DIAGNOSIS — I10 Essential (primary) hypertension: Secondary | ICD-10-CM | POA: Insufficient documentation

## 2015-05-01 DIAGNOSIS — R0789 Other chest pain: Principal | ICD-10-CM | POA: Insufficient documentation

## 2015-05-01 DIAGNOSIS — Z6841 Body Mass Index (BMI) 40.0 and over, adult: Secondary | ICD-10-CM | POA: Diagnosis not present

## 2015-05-01 DIAGNOSIS — Z79899 Other long term (current) drug therapy: Secondary | ICD-10-CM | POA: Insufficient documentation

## 2015-05-01 DIAGNOSIS — R519 Headache, unspecified: Secondary | ICD-10-CM | POA: Diagnosis present

## 2015-05-01 DIAGNOSIS — R079 Chest pain, unspecified: Secondary | ICD-10-CM | POA: Diagnosis present

## 2015-05-01 NOTE — ED Notes (Signed)
Per EMS pt was lying down trying to sleep and she began to have central chest pain, non-radiating. She also complained of SOB.  Denies n/v/d or recent illnesses.  She was given 1 nitro and 324 of ASA.

## 2015-05-01 NOTE — ED Provider Notes (Signed)
CSN: [id number]     Arrival date & time 05/01/15  2327 History  By signing my name below, I, Soijett Blue, attest that this documentation has been prepared under the direction and in the presence of Orpah Greek, MD. Electronically Signed: Soijett Blue, ED Scribe. 05/01/2015. 12:27 AM.   Chief Complaint  Patient presents with  . Chest Pain      The history is provided by the patient. No language interpreter was used.    HPI Comments: Linda Cabrera is a 60 y.o. female with a medical hx of asthma and HTN who presents to the Emergency Department via EMS complaining of central, non-radiating, CP onset 7 PM. She notes that she was laying down resting when the CP began and she describes the CP as a heaviness sensation. She denies ever having CP like this in the past. She states that she is having associated symptoms of mildly resolved SOB, HA, and intermittent palpitations. She states that she was given 1 NTG and 324mg  ASA and improved, but did not have complete resolution. She denies any other symptoms. Pt denies having a recent stress test. Pt denies heart attack or heart issues at this time. She notes that her brother had a heart attack at the age of 78.   Past Medical History  Diagnosis Date  . Asthma   . Hypertension   . Herpes   . Arthritis    Past Surgical History  Procedure Laterality Date  . Colonoscopy N/A 08/15/2013    Procedure: COLONOSCOPY;  Surgeon: Jerene Bears, MD;  Location: [facility];  Service: Gastroenterology;  Laterality: N/A;  . Breath tek h pylori N/A 02/13/2014    Procedure: BREATH TEK H PYLORI;  Surgeon: Gayland Curry, MD;  Location: [facility];  Service: General;  Laterality: N/A;  . Tubal ligation     Family History  Problem Relation Age of Onset  . Arthritis Mother   . Hypertension Mother   . Cancer Mother     pancreatic  . Pancreatic cancer Mother   . Arthritis Father   . Hypertension Father   . Colon cancer Neg Hx    Social History   Substance Use Topics  . Smoking status: Former Smoker    Quit date: 06/16/2004  . Smokeless tobacco: Never Used  . Alcohol Use: No   OB History    Gravida Para Term Preterm AB TAB SAB Ectopic Multiple Living   1 1 1       1      Review of Systems  Respiratory: Positive for shortness of breath.   Cardiovascular: Positive for chest pain and palpitations.  Neurological: Positive for headaches.  All other systems reviewed and are negative.     Allergies  Ace inhibitors  Home Medications   Prior to Admission medications   Medication Sig Start Date End Date Taking? Authorizing Provider  acyclovir (ZOVIRAX) 200 MG capsule TAKE ONE CAPSULE BY MOUTH ONCE DAILY IN THE MORNING 01/24/15  Yes Hoyt Koch, MD  diclofenac (VOLTAREN) 75 MG EC tablet Take 75 mg by mouth 2 (two) times daily. 04/24/15  Yes Historical Provider, MD  furosemide (LASIX) 40 MG tablet TAKE ONE TABLET BY MOUTH EVERY DAY 03/19/15  Yes Hoyt Koch, MD  phentermine (ADIPEX-P) 37.5 MG tablet TAKE ONE TABLET BY MOUTH EVERY DAY 03/19/15  Yes Hoyt Koch, MD  PROVENTIL HFA 108 (90 BASE) MCG/ACT inhaler Inhale 2 puffs into the lungs every 4 (four) hours as needed for wheezing  or shortness of breath.  08/17/13  Yes Historical Provider, MD  triamterene-hydrochlorothiazide (MAXZIDE-25) 37.5-25 MG per tablet Take 1 tablet by mouth daily. 08/29/14  Yes Hoyt Koch, MD  phentermine 37.5 MG capsule Take 1 capsule (37.5 mg total) by mouth every morning. Patient not taking: Reported on 05/02/2015 08/24/14   Hoyt Koch, MD  potassium chloride 20 MEQ TBCR Take 20 mEq by mouth daily. Patient not taking: Reported on 05/02/2015 08/29/14   Hoyt Koch, MD   BP 119/68 mmHg  Pulse 62  Temp(Src) 98.3 F (36.8 C) (Oral)  Resp 14  Ht 5\' 3"  (1.6 m)  Wt 270 lb (122.471 kg)  BMI 47.84 kg/m2  SpO2 98%  LMP 08/25/2014 Physical Exam  Constitutional: She is oriented to person, place, and time. She  appears well-developed and well-nourished. No distress.  HENT:  Head: Normocephalic and atraumatic.  Right Ear: Hearing normal.  Left Ear: Hearing normal.  Nose: Nose normal.  Mouth/Throat: Oropharynx is clear and moist and mucous membranes are normal.  Eyes: Conjunctivae and EOM are normal. Pupils are equal, round, and reactive to light.  Neck: Normal range of motion. Neck supple.  Cardiovascular: Normal rate, regular rhythm, S1 normal, S2 normal and normal heart sounds.  Exam reveals no gallop and no friction rub.   No murmur heard. Pulmonary/Chest: Effort normal and breath sounds normal. No respiratory distress. She exhibits no tenderness.  Abdominal: Soft. Normal appearance and bowel sounds are normal. There is no hepatosplenomegaly. There is no tenderness. There is no rebound, no guarding, no tenderness at McBurney's point and negative Murphy's sign. No hernia.  Musculoskeletal: Normal range of motion.  Neurological: She is alert and oriented to person, place, and time. She has normal strength. No cranial nerve deficit or sensory deficit. Coordination normal. GCS eye subscore is 4. GCS verbal subscore is 5. GCS motor subscore is 6.  Skin: Skin is warm, dry and intact. No rash noted. No cyanosis.  Psychiatric: She has a normal mood and affect. Her speech is normal and behavior is normal. Thought content normal.  Nursing note and vitals reviewed.   ED Course  Procedures (including critical care time) DIAGNOSTIC STUDIES: Oxygen Saturation is 98% on RA, nl by my interpretation.    COORDINATION OF CARE: 12:24 AM Discussed treatment plan with pt at bedside which includes labs, CXR, and EKG and pt agreed to plan.    Labs Review Labs Reviewed  BASIC METABOLIC PANEL - Abnormal; Notable for the following:    Glucose, Bld 144 (*)    BUN 21 (*)    Creatinine, Ser 1.22 (*)    GFR calc non Af Amer 48 (*)    GFR calc Af Amer 55 (*)    All other components within normal limits  CBC -  Abnormal; Notable for the following:    MCH 25.7 (*)    RDW 18.5 (*)    All other components within normal limits  I-STAT TROPOININ, ED    Imaging Review Dg Chest 2 View  05/02/2015  CLINICAL DATA:  60 year old female with shortness of breath EXAM: CHEST  2 VIEW COMPARISON:  Radiograph dated 02/14/2014 FINDINGS: The heart size and mediastinal contours are within normal limits. Both lungs are clear. The visualized skeletal structures are unremarkable. IMPRESSION: No active cardiopulmonary disease. Electronically Signed   By: Anner Crete M.D.   On: 05/02/2015 00:49   Ct Angio Chest Pe W/cm &/or Wo Cm  05/02/2015  CLINICAL DATA:  60 year old female with shortness  of breath and chest pain EXAM: CT ANGIOGRAPHY CHEST WITH CONTRAST TECHNIQUE: Multidetector CT imaging of the chest was performed using the standard protocol during bolus administration of intravenous contrast. Multiplanar CT image reconstructions and MIPs were obtained to evaluate the vascular anatomy. CONTRAST:  56mL OMNIPAQUE IOHEXOL 350 MG/ML SOLN COMPARISON:  Radiograph dated 05/02/2015 FINDINGS: The lungs are clear. No pleural effusion or pneumothorax. The central airways are patent. The thoracic aorta appears unremarkable. no CT evidence of pulmonary embolism. There is no cardiomegaly or pericardial effusion. No hilar or mediastinal adenopathy. The esophagus is unremarkable. There is no axillary adenopathy. The chest wall soft tissues appear unremarkable. There is degenerative changes of the spine. No acute fracture. Gallstones.  The visualized upper abdomen is otherwise unremarkable. Review of the MIP images confirms the above findings. IMPRESSION: No CT evidence of pulmonary embolism. Electronically Signed   By: Anner Crete M.D.   On: 05/02/2015 02:37   I have personally reviewed and evaluated these images and lab results as part of my medical decision-making.   EKG Interpretation   Date/Time:  Tuesday May 01 2015  23:43:19 EST Ventricular Rate:  62 PR Interval:  187 QRS Duration: 95 QT Interval:  431 QTC Calculation: 438 R Axis:   23 Text Interpretation:  Sinus rhythm Abnormal R-wave progression, early  transition No significant change since last tracing Confirmed by Zeus Marquis   MD, [place] [phone]) on 05/02/2015 1:26:48 AM      MDM   Final diagnoses:  None   chest pain  Presents to the emergency department for evaluation of chest pain. She does not have any known history of coronary artery disease. She reports a very distant stress test, no further testing recently. Patient reports that she started to have a tightness in the center of her chest when she was laying down trying to go to sleep. She noticed shortness of breath associated with the symptoms. No nausea, diaphoresis associated. Patient's cardiac risk factors include obesity, hypertension and family history (brother with MI). Patient was administered aspirin and nitroglycerin prior to arrival. She had improvement without complete resolution. She complains of a headache now, given Tylenol. Patient given morphine for chest pain, now reporting intermittent tightness in the chest, no exertional component. Based on her persistent symptoms and multiple risk factors, will ask hospitalist to admit for further workup.  I personally performed the services described in this documentation, which was scribed in my presence. The recorded information has been reviewed and is accurate.     Orpah Greek, MD 05/02/15 [phone]

## 2015-05-02 ENCOUNTER — Encounter (HOSPITAL_COMMUNITY): Payer: Self-pay | Admitting: Radiology

## 2015-05-02 ENCOUNTER — Emergency Department (HOSPITAL_COMMUNITY): Payer: BLUE CROSS/BLUE SHIELD

## 2015-05-02 ENCOUNTER — Observation Stay (HOSPITAL_BASED_OUTPATIENT_CLINIC_OR_DEPARTMENT_OTHER): Payer: BLUE CROSS/BLUE SHIELD

## 2015-05-02 DIAGNOSIS — R079 Chest pain, unspecified: Secondary | ICD-10-CM

## 2015-05-02 DIAGNOSIS — R001 Bradycardia, unspecified: Secondary | ICD-10-CM | POA: Diagnosis not present

## 2015-05-02 DIAGNOSIS — R519 Headache, unspecified: Secondary | ICD-10-CM | POA: Diagnosis present

## 2015-05-02 DIAGNOSIS — R51 Headache: Secondary | ICD-10-CM | POA: Diagnosis not present

## 2015-05-02 DIAGNOSIS — I10 Essential (primary) hypertension: Secondary | ICD-10-CM | POA: Diagnosis not present

## 2015-05-02 DIAGNOSIS — I959 Hypotension, unspecified: Secondary | ICD-10-CM | POA: Diagnosis not present

## 2015-05-02 DIAGNOSIS — R0789 Other chest pain: Secondary | ICD-10-CM | POA: Diagnosis not present

## 2015-05-02 DIAGNOSIS — N289 Disorder of kidney and ureter, unspecified: Secondary | ICD-10-CM

## 2015-05-02 DIAGNOSIS — J4520 Mild intermittent asthma, uncomplicated: Secondary | ICD-10-CM | POA: Diagnosis not present

## 2015-05-02 LAB — MRSA PCR SCREENING: MRSA BY PCR: NEGATIVE

## 2015-05-02 LAB — TROPONIN I
Troponin I: <0.03 ng/mL (ref <0.031)
Troponin I: <0.03 ng/mL (ref <0.031)
Troponin I: <0.03 ng/mL (ref <0.031)

## 2015-05-02 LAB — CBC
HCT: 39.1 % (ref 36.0–46.0)
HEMOGLOBIN: 12.7 g/dL (ref 12.0–15.0)
MCH: 25.7 pg — AB (ref 26.0–34.0)
MCHC: 32.5 g/dL (ref 30.0–36.0)
MCV: 79 fL (ref 78.0–100.0)
Platelets: 188 10*3/uL (ref 150–400)
RBC: 4.95 MIL/uL (ref 3.87–5.11)
RDW: 18.5 % — ABNORMAL HIGH (ref 11.5–15.5)
WBC: 8.1 10*3/uL (ref 4.0–10.5)

## 2015-05-02 LAB — BASIC METABOLIC PANEL
ANION GAP: 12 (ref 5–15)
BUN: 21 mg/dL — ABNORMAL HIGH (ref 6–20)
CALCIUM: 9.1 mg/dL (ref 8.9–10.3)
CO2: 23 mmol/L (ref 22–32)
Chloride: 106 mmol/L (ref 101–111)
Creatinine, Ser: 1.22 mg/dL — ABNORMAL HIGH (ref 0.44–1.00)
GFR calc non Af Amer: 48 mL/min — ABNORMAL LOW (ref >60)
GFR, EST AFRICAN AMERICAN: 55 mL/min — AB (ref >60)
GLUCOSE: 144 mg/dL — AB (ref 65–99)
Potassium: 3.8 mmol/L (ref 3.5–5.1)
Sodium: 141 mmol/L (ref 135–145)

## 2015-05-02 LAB — TSH: TSH: 0.463 u[IU]/mL (ref 0.350–4.500)

## 2015-05-02 LAB — MAGNESIUM: Magnesium: 2.5 mg/dL — ABNORMAL HIGH (ref 1.7–2.4)

## 2015-05-02 LAB — I-STAT TROPONIN, ED: Troponin i, poc: 0 ng/mL (ref 0.00–0.08)

## 2015-05-02 MED ORDER — PERFLUTREN LIPID MICROSPHERE
INTRAVENOUS | Status: AC
  Filled 2015-05-02: qty 10

## 2015-05-02 MED ORDER — MORPHINE SULFATE (PF) 4 MG/ML IV SOLN
4.0000 mg | Freq: Once | INTRAVENOUS | Status: AC
  Administered 2015-05-02: 4 mg via INTRAVENOUS
  Filled 2015-05-02: qty 1

## 2015-05-02 MED ORDER — GI COCKTAIL ~~LOC~~: 30.0000 mL | Freq: Four times a day (QID) | ORAL | Status: DC | PRN

## 2015-05-02 MED ORDER — IBUPROFEN 200 MG PO TABS
600.0000 mg | ORAL_TABLET | Freq: Once | ORAL | Status: AC
  Administered 2015-05-02: 600 mg via ORAL
  Filled 2015-05-02: qty 3

## 2015-05-02 MED ORDER — ENOXAPARIN SODIUM 60 MG/0.6ML ~~LOC~~ SOLN
60.0000 mg | SUBCUTANEOUS | Status: DC
  Administered 2015-05-02 – 2015-05-03 (×2): 60 mg via SUBCUTANEOUS
  Filled 2015-05-02 (×2): qty 0.6

## 2015-05-02 MED ORDER — NITROGLYCERIN 2 % TD OINT
0.5000 [in_us] | TOPICAL_OINTMENT | Freq: Three times a day (TID) | TRANSDERMAL | Status: DC
  Administered 2015-05-02 – 2015-05-03 (×3): 0.5 [in_us] via TOPICAL
  Filled 2015-05-02: qty 30

## 2015-05-02 MED ORDER — ONDANSETRON HCL 4 MG/2ML IJ SOLN: 4.0000 mg | Freq: Four times a day (QID) | INTRAMUSCULAR | Status: DC | PRN

## 2015-05-02 MED ORDER — ASPIRIN 325 MG PO TABS
325.0000 mg | ORAL_TABLET | Freq: Every day | ORAL | Status: DC
  Administered 2015-05-02 – 2015-05-03 (×2): 325 mg via ORAL
  Filled 2015-05-02 (×2): qty 1

## 2015-05-02 MED ORDER — ACYCLOVIR 200 MG PO CAPS
200.0000 mg | ORAL_CAPSULE | Freq: Every day | ORAL | Status: DC
  Administered 2015-05-02 – 2015-05-03 (×2): 200 mg via ORAL
  Filled 2015-05-02 (×2): qty 1

## 2015-05-02 MED ORDER — ALBUTEROL SULFATE (2.5 MG/3ML) 0.083% IN NEBU: 3.0000 mL | INHALATION_SOLUTION | RESPIRATORY_TRACT | Status: DC | PRN

## 2015-05-02 MED ORDER — ONDANSETRON HCL 4 MG/2ML IJ SOLN
4.0000 mg | Freq: Once | INTRAMUSCULAR | Status: AC
  Administered 2015-05-02: 4 mg via INTRAVENOUS
  Filled 2015-05-02: qty 2

## 2015-05-02 MED ORDER — ACETAMINOPHEN 325 MG PO TABS
650.0000 mg | ORAL_TABLET | Freq: Once | ORAL | Status: AC
  Administered 2015-05-02: 650 mg via ORAL
  Filled 2015-05-02: qty 2

## 2015-05-02 MED ORDER — MORPHINE SULFATE (PF) 2 MG/ML IV SOLN
2.0000 mg | INTRAVENOUS | Status: DC | PRN
  Administered 2015-05-02 – 2015-05-03 (×3): 2 mg via INTRAVENOUS
  Filled 2015-05-02 (×3): qty 1

## 2015-05-02 MED ORDER — PERFLUTREN LIPID MICROSPHERE
1.0000 mL | INTRAVENOUS | Status: AC | PRN
  Administered 2015-05-02: 4 mL via INTRAVENOUS
  Filled 2015-05-02: qty 10

## 2015-05-02 MED ORDER — ACETAMINOPHEN 325 MG PO TABS: 650.0000 mg | ORAL_TABLET | ORAL | Status: DC | PRN

## 2015-05-02 MED ORDER — ACETAMINOPHEN 325 MG PO TABS: 650.0000 mg | ORAL_TABLET | Freq: Four times a day (QID) | ORAL | Status: DC | PRN

## 2015-05-02 MED ORDER — IOHEXOL 350 MG/ML SOLN
100.0000 mL | Freq: Once | INTRAVENOUS | Status: AC | PRN
  Administered 2015-05-02: 60 mL via INTRAVENOUS

## 2015-05-02 NOTE — Progress Notes (Addendum)
[place] TEAM 1 - Stepdown/ICU TEAM PROGRESS NOTE  Linda Cabrera [id number] DOB: [birth year] DOA: 05/01/2015 PCP: Hoyt Koch, MD  Admit HPI / Brief Narrative: 60 year old female with a history of hypertension, obesity, and asthma who presented with complaints of chest pain. Patient noted that around 7 PM she developed a headache and went to lay down. Upon laying down she began to feel chest pressure substernally and her heart began to flutter. She reported the chest pressure felt like an elephant was sitting on her chest. Denied diaphoresis, nausea, vomiting, or abdominal pain. Denied ever having similar symptoms in the past. She is on Adipex, but she reported not using this regularly. She also noted progressive worsening leg swelling. She noted a family history of heart disease in her mother and brother (48 y/o), but she does not know age of onset of heart problems. She quit smoking over 8 years ago.  In the ED EKG showed sinus rhythm similar to previous tracings. Initial troponin negative. She was given aspirin 324mg  and nitroglycerin and morphine.  HPI/Subjective: Pt seen for f/u visit.  Assessment/Plan:  Atypical Chest Pain (onset at rest) Unable to use BB due to bradycardia - add ASA - as pain ongoing add nitro paste - Cards consulted due to high risk factor profile (obesity, possible DM, HTN, tobacco abuse, possible Fam Hx)  Bradycardia   Hyperglycemia   HTN  Asthma   Morbid obesity - Body mass index is 47.84 kg/(m^2).  Code Status: FULL Family Communication: no family present at time of exam Disposition Plan:   Consultants: [facility] Cards   Procedures: none  Antibiotics: none  DVT prophylaxis: lovenox   Objective: Blood pressure 112/68, pulse 50, temperature 97.6 F (36.4 C), temperature source Oral, resp. rate 15, height 5\' 3"  (1.6 m), weight 122.471 kg (270 lb), last menstrual period 08/25/2014, SpO2 100 %. No intake or output data in the 24 hours  ending 05/02/15 1110  Exam: Pt seen for f/u visit.  Data Reviewed: Basic Metabolic Panel:  Recent Labs Lab 05/02/15 0052 05/02/15 0515  NA 141  --   K 3.8  --   CL 106  --   CO2 23  --   GLUCOSE 144*  --   BUN 21*  --   CREATININE 1.22*  --   CALCIUM 9.1  --   MG  --  2.5*    CBC:  Recent Labs Lab 05/02/15 0052  WBC 8.1  HGB 12.7  HCT 39.1  MCV 79.0  PLT 188    Liver Function Tests: No results for input(s): AST, ALT, ALKPHOS, BILITOT, PROT, ALBUMIN in the last 168 hours. No results for input(s): LIPASE, AMYLASE in the last 168 hours. No results for input(s): AMMONIA in the last 168 hours.  Coags: No results for input(s): INR in the last 168 hours.  Invalid input(s): PT No results for input(s): APTT in the last 168 hours.  Cardiac Enzymes:  Recent Labs Lab 05/02/15 0515  TROPONINI <0.03    CBG: No results for input(s): GLUCAP in the last 168 hours.  Recent Results (from the past 240 hour(s))  MRSA PCR Screening     Status: None   Collection Time: 05/02/15  7:44 AM  Result Value Ref Range Status   MRSA by PCR NEGATIVE NEGATIVE Final    Comment:        The GeneXpert MRSA Assay (FDA approved for NASAL specimens only), is one component of a comprehensive MRSA colonization surveillance program. It is  not intended to diagnose MRSA infection nor to guide or monitor treatment for MRSA infections.      Studies:   Recent x-ray studies have been reviewed in detail by the Attending Physician  Scheduled Meds:  Scheduled Meds: . acyclovir  200 mg Oral Daily  . enoxaparin (LOVENOX) injection  60 mg Subcutaneous Q24H    Time spent on care of this patient: No charge   Cherene Altes , MD   [facility] Office  [phone] Pager - Text Page per Amion as per below:  On-Call/Text Page:      [email or web address]      password [email or web address]  If 7PM-7AM, please contact night-coverage [email or web address] Password [email or web address] 05/02/2015, 11:10 AM

## 2015-05-02 NOTE — Progress Notes (Signed)
  Echocardiogram 2D Echocardiogram with Definity has been performed.  Linda Cabrera 05/02/2015, 1:02 PM

## 2015-05-02 NOTE — ED Notes (Signed)
Patient transported to CT 

## 2015-05-02 NOTE — Consult Note (Addendum)
Primary Physician: Primary Cardiologist:  New   HPI: Asked to see for CP  Pt is a 60 yo with history of HTN, obesity and asthma.  Admitted today with  Prior to yesterday had felt fine Had steroid injections to foot yesterday Last night had HA  Went to bed  When she laid down got CP  Heart fluttering at time  Tightness.  Elephant sitting on chest   New.   Discomfort is worse with laying on L side   No fevers.  No cough    Pain can be pleuritic  Pt has had loose bowel movements since yesterday      Past Medical History  Diagnosis Date  . Asthma   . Hypertension   . Herpes   . Arthritis     Medications Prior to Admission  Medication Sig Dispense Refill  . acyclovir (ZOVIRAX) 200 MG capsule TAKE ONE CAPSULE BY MOUTH ONCE DAILY IN THE MORNING 90 capsule 2  . diclofenac (VOLTAREN) 75 MG EC tablet Take 75 mg by mouth 2 (two) times daily.  0  . furosemide (LASIX) 40 MG tablet TAKE ONE TABLET BY MOUTH EVERY DAY 90 tablet 1  . phentermine (ADIPEX-P) 37.5 MG tablet TAKE ONE TABLET BY MOUTH EVERY DAY 30 tablet 3  . PROVENTIL HFA 108 (90 BASE) MCG/ACT inhaler Inhale 2 puffs into the lungs every 4 (four) hours as needed for wheezing or shortness of breath.     . triamterene-hydrochlorothiazide (MAXZIDE-25) 37.5-25 MG per tablet Take 1 tablet by mouth daily. 90 tablet 3  . phentermine 37.5 MG capsule Take 1 capsule (37.5 mg total) by mouth every morning. (Patient not taking: Reported on 05/02/2015) 30 capsule 3  . potassium chloride 20 MEQ TBCR Take 20 mEq by mouth daily. (Patient not taking: Reported on 05/02/2015) 90 tablet 0     . acyclovir  200 mg Oral Daily  . aspirin  325 mg Oral Daily  . enoxaparin (LOVENOX) injection  60 mg Subcutaneous Q24H  . nitroGLYCERIN  0.5 inch Topical 3 times per day    Infusions:    Allergies  Allergen Reactions  . Ace Inhibitors Swelling    Social History   Social History  . Marital Status: Married    Spouse Name: N/A  . Number of  Children: N/A  . Years of Education: N/A   Occupational History  . Not on file.   Social History Main Topics  . Smoking status: Former Smoker    Quit date: 06/16/2004  . Smokeless tobacco: Never Used  . Alcohol Use: No  . Drug Use: No     Comment: remote h/o crack  . Sexual Activity: Yes    Birth Control/ Protection: None, Surgical   Other Topics Concern  . Not on file   Social History Narrative    Family History  Problem Relation Age of Onset  . Arthritis Mother   . Hypertension Mother   . Cancer Mother     pancreatic  . Pancreatic cancer Mother   . Arthritis Father   . Hypertension Father   . Colon cancer Neg Hx     REVIEW OF SYSTEMS:  All systems reviewed  Negative to the above problem except as noted above.    PHYSICAL EXAM: Filed Vitals:   05/02/15 0845 05/02/15 0900  BP: 105/72 112/68  Pulse: 48 50  Temp:    Resp: 13 15    No intake or output data in the 24 hours ending 05/02/15 1159  General:  Well appearing. No respiratory difficulty HEENT: normal Neck: supple. no JVD. Carotids 2+ bilat; no bruits. No lymphadenopathy or thryomegaly appreciated. Cor: PMI nondisplaced. Regular rate & rhythm. No rubs, gallops or murmurs. Chest  Tender to palpation L chest. Sometimes     Lungs: clear Abdomen: soft, nontender, nondistended. No hepatosplenomegaly. No bruits or masses. Good bowel sounds. Extremities: no cyanosis, clubbing, rash, edema Neuro: alert & oriented x 3, cranial nerves grossly intact. moves all 4 extremities w/o difficulty. Affect pleasant.  ECG:  SR 62 bpm  Nonspecific  ST T wave changes    Results for orders placed or performed during the hospital encounter of 05/01/15 (from the past 24 hour(s))  I-stat troponin, ED (not at [facility], [facility])     Status: None   Collection Time: 05/02/15 12:40 AM  Result Value Ref Range   Troponin i, poc 0.00 0.00 - 0.08 ng/mL   Comment 3          Basic metabolic panel     Status: Abnormal   Collection Time:  05/02/15 12:52 AM  Result Value Ref Range   Sodium 141 135 - 145 mmol/L   Potassium 3.8 3.5 - 5.1 mmol/L   Chloride 106 101 - 111 mmol/L   CO2 23 22 - 32 mmol/L   Glucose, Bld 144 (H) 65 - 99 mg/dL   BUN 21 (H) 6 - 20 mg/dL   Creatinine, Ser 1.22 (H) 0.44 - 1.00 mg/dL   Calcium 9.1 8.9 - 10.3 mg/dL   GFR calc non Af Amer 48 (L) >60 mL/min   GFR calc Af Amer 55 (L) >60 mL/min   Anion gap 12 5 - 15  CBC     Status: Abnormal   Collection Time: 05/02/15 12:52 AM  Result Value Ref Range   WBC 8.1 4.0 - 10.5 K/uL   RBC 4.95 3.87 - 5.11 MIL/uL   Hemoglobin 12.7 12.0 - 15.0 g/dL   HCT 39.1 36.0 - 46.0 %   MCV 79.0 78.0 - 100.0 fL   MCH 25.7 (L) 26.0 - 34.0 pg   MCHC 32.5 30.0 - 36.0 g/dL   RDW 18.5 (H) 11.5 - 15.5 %   Platelets 188 150 - 400 K/uL  Magnesium     Status: Abnormal   Collection Time: 05/02/15  5:15 AM  Result Value Ref Range   Magnesium 2.5 (H) 1.7 - 2.4 mg/dL  TSH     Status: None   Collection Time: 05/02/15  5:15 AM  Result Value Ref Range   TSH 0.463 0.350 - 4.500 uIU/mL  Troponin I-serum (0, 3, 6 hours)     Status: None   Collection Time: 05/02/15  5:15 AM  Result Value Ref Range   Troponin I <0.03 <0.031 ng/mL  MRSA PCR Screening     Status: None   Collection Time: 05/02/15  7:44 AM  Result Value Ref Range   MRSA by PCR NEGATIVE NEGATIVE  Troponin I-serum (0, 3, 6 hours)     Status: None   Collection Time: 05/02/15  9:44 AM  Result Value Ref Range   Troponin I <0.03 <0.031 ng/mL   Dg Chest 2 View  05/02/2015  CLINICAL DATA:  60 year old female with shortness of breath EXAM: CHEST  2 VIEW COMPARISON:  Radiograph dated 02/14/2014 FINDINGS: The heart size and mediastinal contours are within normal limits. Both lungs are clear. The visualized skeletal structures are unremarkable. IMPRESSION: No active cardiopulmonary disease. Electronically Signed   By: Laren Everts.D.  On: 05/02/2015 00:49   Ct Angio Chest Pe W/cm &/or Wo Cm  05/02/2015  CLINICAL DATA:   60 year old female with shortness of breath and chest pain EXAM: CT ANGIOGRAPHY CHEST WITH CONTRAST TECHNIQUE: Multidetector CT imaging of the chest was performed using the standard protocol during bolus administration of intravenous contrast. Multiplanar CT image reconstructions and MIPs were obtained to evaluate the vascular anatomy. CONTRAST:  29mL OMNIPAQUE IOHEXOL 350 MG/ML SOLN COMPARISON:  Radiograph dated 05/02/2015 FINDINGS: The lungs are clear. No pleural effusion or pneumothorax. The central airways are patent. The thoracic aorta appears unremarkable. no CT evidence of pulmonary embolism. There is no cardiomegaly or pericardial effusion. No hilar or mediastinal adenopathy. The esophagus is unremarkable. There is no axillary adenopathy. The chest wall soft tissues appear unremarkable. There is degenerative changes of the spine. No acute fracture. Gallstones.  The visualized upper abdomen is otherwise unremarkable. Review of the MIP images confirms the above findings. IMPRESSION: No CT evidence of pulmonary embolism. Electronically Signed   By: Anner Crete M.D.   On: 05/02/2015 02:37     ASSESSMENT:  Pt is a 60 yo with CP  Atypical for cardiac  Some of it appears musculoskeletal Echo  1.  CP  L sided CP that began yesterday  Pain is atypical  Pleuritic  Positional  Sometimes worse with palpation  I am not convinced cardiac  Would cycle cardiac enzymes  Would give trial of NSAIDS for now  2.  Bradycardia  HR is low  But BP is OK  Follow    3  HTN  BP is OK    4  GI  Pt has had several episodes of loose bowel movements  Question if related to 1

## 2015-05-02 NOTE — H&P (Addendum)
[facility] History and Physical  Linda Cabrera [id number] DOB: [birth year] DOA: 05/01/2015  Referring physician:ED PCP: Hoyt Koch, MD   Chief Complaint: Chest pain  HPI:  [name] is a 60 year old female with a past medical history of hypertension, obesity, and asthma; who presents with complaints of chest pain. Patient notes that around 7 PM she developed a headache and went to lay down. All laying down she began to feel chest pressure substernally and she felt her heart began to flutter. Also describes it as chest tightness. She tried turning over to see if symptoms would resolve, but they continued. She reports the chest pressure feeling like an elephant was sitting on her chest which made it hard for her to catch her breath. Denies any diaphoresis, nausea, vomiting, or abdominal pain. Denies any radiation of the pain. He reports never having similar symptoms like this in the past. Reports that she had received 4 shots of steroids and her foot yesterday for bone spurs. Upon review of her medication list it appears she is on Adipex, but she reports not using this regularly. He also complains of progressively worsening leg swelling which she is on Lasix and a combination pill with HCTZ. She also notes a family history of heart disease in her mother and brother(70 y/o), but she does not know age of onset of heart problems. She quit smoking over 8 years ago.  Upon admission into the emergency department patient is evaluated with EKG showed sinus rhythm similar to previous tracings.Initial troponin negative. She was given aspirin 324mg  and nitroglycerin in route and morphine in the ED for chest pain symptoms. Patient still intermittently complaining of chest pressure which is recommended she be admitted to the stepdown unit.  Review of Systems  Constitutional: Negative for fever, chills and weight loss.  HENT: Negative for ear pain and tinnitus.   Eyes: Negative for double vision  and photophobia.  Respiratory: Positive for shortness of breath. Negative for cough and wheezing.   Cardiovascular: Positive for chest pain, palpitations and leg swelling.  Gastrointestinal: Negative for vomiting and abdominal pain.  Genitourinary: Negative for urgency and frequency.  Musculoskeletal: Positive for joint pain. Negative for neck pain.  Neurological: Positive for headaches. Negative for sensory change and speech change.  Endo/Heme/Allergies: Negative for polydipsia. Does not bruise/bleed easily.  Psychiatric/Behavioral: Negative for substance abuse.       Past Medical History  Diagnosis Date  . Asthma   . Hypertension   . Herpes   . Arthritis      Past Surgical History  Procedure Laterality Date  . Colonoscopy N/A 08/15/2013    Procedure: COLONOSCOPY;  Surgeon: Jerene Bears, MD;  Location: [facility];  Service: Gastroenterology;  Laterality: N/A;  . Breath tek h pylori N/A 02/13/2014    Procedure: BREATH TEK H PYLORI;  Surgeon: Gayland Curry, MD;  Location: [facility];  Service: General;  Laterality: N/A;  . Tubal ligation        Social History:  reports that she quit smoking about 10 years ago. She has never used smokeless tobacco. She reports that she does not drink alcohol or use illicit drugs. Where does patient live--home Can patient participate in ADLs? Yes  Allergies  Allergen Reactions  . Ace Inhibitors Swelling    Family History  Problem Relation Age of Onset  . Arthritis Mother   . Hypertension Mother   . Cancer Mother     pancreatic  . Pancreatic cancer Mother   .  Arthritis Father   . Hypertension Father   . Colon cancer Neg Hx         Prior to Admission medications   Medication Sig Start Date End Date Taking? Authorizing Provider  acyclovir (ZOVIRAX) 200 MG capsule TAKE ONE CAPSULE BY MOUTH ONCE DAILY IN THE MORNING 01/24/15  Yes Hoyt Koch, MD  diclofenac (VOLTAREN) 75 MG EC tablet Take 75 mg by mouth 2 (two) times  daily. 04/24/15  Yes Historical Provider, MD  furosemide (LASIX) 40 MG tablet TAKE ONE TABLET BY MOUTH EVERY DAY 03/19/15  Yes Hoyt Koch, MD  phentermine (ADIPEX-P) 37.5 MG tablet TAKE ONE TABLET BY MOUTH EVERY DAY 03/19/15  Yes Hoyt Koch, MD  PROVENTIL HFA 108 (90 BASE) MCG/ACT inhaler Inhale 2 puffs into the lungs every 4 (four) hours as needed for wheezing or shortness of breath.  08/17/13  Yes Historical Provider, MD  triamterene-hydrochlorothiazide (MAXZIDE-25) 37.5-25 MG per tablet Take 1 tablet by mouth daily. 08/29/14  Yes Hoyt Koch, MD  phentermine 37.5 MG capsule Take 1 capsule (37.5 mg total) by mouth every morning. Patient not taking: Reported on 05/02/2015 08/24/14   Hoyt Koch, MD  potassium chloride 20 MEQ TBCR Take 20 mEq by mouth daily. Patient not taking: Reported on 05/02/2015 08/29/14   Hoyt Koch, MD     Physical Exam: Filed Vitals:   05/01/15 2346 05/01/15 2351  BP:  119/68  Pulse:  62  Temp:  98.3 F (36.8 C)  TempSrc:  Oral  Resp:  14  Height:  5\' 3"  (1.6 m)  Weight:  122.471 kg (270 lb)  SpO2: 98% 98%     Constitutional: Vital signs reviewed. Patient is a well-developed and well-nourished in no acute distress and cooperative with exam. Alert and oriented x3.  Head: Normocephalic and atraumatic  Ear: TM normal bilaterally  Mouth: no erythema or exudates, MMM  Eyes: PERRL, EOMI, conjunctivae normal, No scleral icterus.  Neck: Supple, Trachea midline normal ROM, No JVD, mass, thyromegaly, or carotid bruit present.  Cardiovascular: Bradycardic heart rates in the upper 40's-50's while in the room. Pulmonary/Chest: CTAB, no wheezes, rales, or rhonchi  Abdominal: Soft. Non-tender, non-distended, bowel sounds are normal, no masses, organomegaly, or guarding present.  GU: no CVA tenderness Musculoskeletal: No joint deformities, erythema, or stiffness, ROM full and no nontender Ext: +1 pitting edema of the bilateral lower  extremities. no cyanosis, pulses palpable bilaterally (DP and PT)  Hematology: no cervical, inginal, or axillary adenopathy.  Neurological: A&O x3, Strenght is normal and symmetric bilaterally, cranial nerve II-XII are grossly intact, no focal motor deficit, sensory intact to light touch bilaterally.  Skin: Warm, dry and intact. No rash, cyanosis, or clubbing.  Psychiatric: Normal mood and affect. speech and behavior is normal. Judgment and thought content normal. Cognition and memory are normal.      Data Review   Micro Results No results found for this or any previous visit (from the past 240 hour(s)).  Radiology Reports Dg Chest 2 View  05/02/2015  CLINICAL DATA:  60 year old female with shortness of breath EXAM: CHEST  2 VIEW COMPARISON:  Radiograph dated 02/14/2014 FINDINGS: The heart size and mediastinal contours are within normal limits. Both lungs are clear. The visualized skeletal structures are unremarkable. IMPRESSION: No active cardiopulmonary disease. Electronically Signed   By: Anner Crete M.D.   On: 05/02/2015 00:49   Ct Angio Chest Pe W/cm &/or Wo Cm  05/02/2015  CLINICAL DATA:  60 year old female with  shortness of breath and chest pain EXAM: CT ANGIOGRAPHY CHEST WITH CONTRAST TECHNIQUE: Multidetector CT imaging of the chest was performed using the standard protocol during bolus administration of intravenous contrast. Multiplanar CT image reconstructions and MIPs were obtained to evaluate the vascular anatomy. CONTRAST:  66mL OMNIPAQUE IOHEXOL 350 MG/ML SOLN COMPARISON:  Radiograph dated 05/02/2015 FINDINGS: The lungs are clear. No pleural effusion or pneumothorax. The central airways are patent. The thoracic aorta appears unremarkable. no CT evidence of pulmonary embolism. There is no cardiomegaly or pericardial effusion. No hilar or mediastinal adenopathy. The esophagus is unremarkable. There is no axillary adenopathy. The chest wall soft tissues appear unremarkable. There  is degenerative changes of the spine. No acute fracture. Gallstones.  The visualized upper abdomen is otherwise unremarkable. Review of the MIP images confirms the above findings. IMPRESSION: No CT evidence of pulmonary embolism. Electronically Signed   By: Anner Crete M.D.   On: 05/02/2015 02:37     CBC  Recent Labs Lab 05/02/15 0052  WBC 8.1  HGB 12.7  HCT 39.1  PLT 188  MCV 79.0  MCH 25.7*  MCHC 32.5  RDW 18.5*    Chemistries   Recent Labs Lab 05/02/15 0052  NA 141  K 3.8  CL 106  CO2 23  GLUCOSE 144*  BUN 21*  CREATININE 1.22*  CALCIUM 9.1   ------------------------------------------------------------------------------------------------------------------ estimated creatinine clearance is 63.8 mL/min (by C-G formula based on Cr of 1.22). ------------------------------------------------------------------------------------------------------------------ No results for input(s): HGBA1C in the last 72 hours. ------------------------------------------------------------------------------------------------------------------ No results for input(s): CHOL, HDL, LDLCALC, TRIG, CHOLHDL, LDLDIRECT in the last 72 hours. ------------------------------------------------------------------------------------------------------------------ No results for input(s): TSH, T4TOTAL, T3FREE, THYROIDAB in the last 72 hours.  Invalid input(s): FREET3 ------------------------------------------------------------------------------------------------------------------ No results for input(s): VITAMINB12, FOLATE, FERRITIN, TIBC, IRON, RETICCTPCT in the last 72 hours.  Coagulation profile No results for input(s): INR, PROTIME in the last 168 hours.  No results for input(s): DDIMER in the last 72 hours.  Cardiac Enzymes No results for input(s): CKMB, TROPONINI, MYOGLOBIN in the last 168 hours.  Invalid input(s):  CK ------------------------------------------------------------------------------------------------------------------ Invalid input(s): POCBNP   CBG: No results for input(s): GLUCAP in the last 168 hours.      EKG: Independently reviewed. Sinus rhythm similar to previous tracing   Assessment/Plan Principal Problem:    Chest pain: Acute. Patient reporting chest pressure symptoms as though it open is sitting on her chest. Patient reports family history of heart disease. Heart score approximately 4. Initial EKG similar to previous tracings and troponins negative. - Admit to stepdown unit as patient still complains of active chest pain - IV morphine as needed pain - Trend cardiac troponin 3 - Echocardiogram in a.m. - Repeat EKG - Held Adipex as this can likely precipitate chest pain symptoms - Patient NPO for possible need stress test    Bradycardia: Acute. Patient with heart rates in the 40s to 50s per monitor. Patient does not appear to be on any medications which slow heart rate - Follow-up telemetry - chesk tsh  Headache - prn Tylenol  Essential Hypertension: stable - Held furosemide and combination pill Maxzide - Would recommend compression stockings for lower extremity edema  Mild renal insufficiency: Creatinine elevated at 1.22 BUN 21. Baseline creatinine appears to be somewhere around 0.9-1.1. - continue to montior - Questioned patient began on to diuretic pills  History of tobacco abuse: Patient reports quitting smoking tobacco 8 years ago  Hyperglycemia with glucose 144  - Check hemoglobin A1c  Code Status:   full Family  Communication: bedside Disposition Plan: admit   Total time spent 55 minutes.Greater than 50% of this time was spent in counseling, explanation of diagnosis, planning of further management, and coordination of care  [facility] Pager [phone]  If 7PM-7AM, please contact night-coverage [email or web address] Password  [email or web address] 05/02/2015, 4:18 AM

## 2015-05-03 DIAGNOSIS — I9589 Other hypotension: Secondary | ICD-10-CM | POA: Diagnosis not present

## 2015-05-03 DIAGNOSIS — R0789 Other chest pain: Secondary | ICD-10-CM | POA: Diagnosis present

## 2015-05-03 DIAGNOSIS — J4520 Mild intermittent asthma, uncomplicated: Secondary | ICD-10-CM | POA: Diagnosis not present

## 2015-05-03 DIAGNOSIS — Z87891 Personal history of nicotine dependence: Secondary | ICD-10-CM

## 2015-05-03 DIAGNOSIS — I10 Essential (primary) hypertension: Secondary | ICD-10-CM | POA: Diagnosis not present

## 2015-05-03 DIAGNOSIS — I959 Hypotension, unspecified: Secondary | ICD-10-CM | POA: Diagnosis not present

## 2015-05-03 DIAGNOSIS — R079 Chest pain, unspecified: Secondary | ICD-10-CM | POA: Diagnosis not present

## 2015-05-03 DIAGNOSIS — R001 Bradycardia, unspecified: Secondary | ICD-10-CM | POA: Diagnosis not present

## 2015-05-03 LAB — CBC
HCT: 35.1 % — ABNORMAL LOW (ref 36.0–46.0)
Hemoglobin: 10.9 g/dL — ABNORMAL LOW (ref 12.0–15.0)
MCH: 24.4 pg — AB (ref 26.0–34.0)
MCHC: 31.1 g/dL (ref 30.0–36.0)
MCV: 78.5 fL (ref 78.0–100.0)
PLATELETS: 201 10*3/uL (ref 150–400)
RBC: 4.47 MIL/uL (ref 3.87–5.11)
RDW: 18.6 % — AB (ref 11.5–15.5)
WBC: 7.5 10*3/uL (ref 4.0–10.5)

## 2015-05-03 LAB — BASIC METABOLIC PANEL
Anion gap: 7 (ref 5–15)
BUN: 15 mg/dL (ref 6–20)
CALCIUM: 8.7 mg/dL — AB (ref 8.9–10.3)
CHLORIDE: 110 mmol/L (ref 101–111)
CO2: 25 mmol/L (ref 22–32)
CREATININE: 0.77 mg/dL (ref 0.44–1.00)
GFR calc non Af Amer: >60 mL/min (ref >60)
GLUCOSE: 87 mg/dL (ref 65–99)
Potassium: 3.7 mmol/L (ref 3.5–5.1)
Sodium: 142 mmol/L (ref 135–145)

## 2015-05-03 LAB — TROPONIN I: Troponin I: <0.03 ng/mL (ref <0.031)

## 2015-05-03 LAB — LIPID PANEL
CHOL/HDL RATIO: 2.4 ratio
CHOLESTEROL: 134 mg/dL (ref 0–200)
HDL: 55 mg/dL (ref >40)
LDL Cholesterol: 71 mg/dL (ref 0–99)
Triglycerides: 41 mg/dL (ref <150)
VLDL: 8 mg/dL (ref 0–40)

## 2015-05-03 MED ORDER — IBUPROFEN 200 MG PO TABS
600.0000 mg | ORAL_TABLET | Freq: Once | ORAL | Status: AC
  Administered 2015-05-03: 600 mg via ORAL
  Filled 2015-05-03: qty 3

## 2015-05-03 NOTE — Progress Notes (Signed)
Pt discharged home per orders. Instructions reviewed with pt and all questions answered.

## 2015-05-03 NOTE — Discharge Summary (Signed)
Physician Discharge Summary  Linda Cabrera [id number] DOB: [birth year] DOA: 05/01/2015  PCP: Hoyt Koch, MD  Admit date: 05/01/2015 Discharge date: 05/03/2015  Time spent: 35 minutes  Recommendations for Outpatient Follow-up: Atypical Chest Pain (onset at rest) -Unable to use BB due to bradycardia  - add ASA  -Cardiology evaluated and felt to be noncardiac in nature. -Counseled patient that side effects of Phentermine use can be arrhythmia, HTN, CHF. Advised patient to discontinue using phentermine. -Schedule Follow-up with Dr. Pricilla Holm (PCP) within one week of discharge. Noncardiac chest pain, hypotension, bradycardia  HTN/Hypotension/Bradycardia  -Patient currently not on any BP medication and is baseline hypotensive. Counseled patient not to restart any home BP medication unless she becomes hypertensive. -See atypical chest pain  Asthma  -Restart home Proventil  Morbid obesity - Body mass index is 47.84 kg/(m^2). -Would speak with PCP about starting different weight loss regimen as she has been counseled to stop Phentermine      Discharge Diagnoses:  Principal Problem:   Chest pain Active Problems:   History of tobacco abuse   HYPERTENSION, BENIGN SYSTEMIC   Bradycardia   Renal insufficiency   Headache   Atypical chest pain   Hypotension   Asthma, mild intermittent   Discharge Condition: Stable  Diet recommendation: Heart healthy  Filed Weights   05/01/15 2351  Weight: 122.471 kg (270 lb)    History of present illness:  60 year old BF PMHx HTN, Obesity, Herpes, Asthma;   Presents with complaints of chest pain. Patient notes that around 7 PM she developed a headache and went to lay down. All laying down she began to feel chest pressure substernally and she felt her heart began to flutter. Also describes it as chest tightness. She tried turning over to see if symptoms would resolve, but they continued. She reports the chest pressure  feeling like an elephant was sitting on her chest which made it hard for her to catch her breath. Denies any diaphoresis, nausea, vomiting, or abdominal pain. Denies any radiation of the pain. He reports never having similar symptoms like this in the past. Reports that she had received 4 shots of steroids and her foo complains of progressively worsening leg swelling which she is on Lasix and a combination pill with HCTZ. She also notes a faot yesterday for bone spurs. Upon review of her medication list it appears she is on Adipex, but she reports not using this regularly. He alsmily history of heart disease in her mother and brother(70 y/o), but she does not know age of onset of heart problems. She quit smoking over 8 years ago.  Upon admission into the emergency department patient is evaluated with EKG showed sinus rhythm similar to previous tracings.Initial troponin negative. She was given aspirin 324mg  and nitroglycerin in route and morphine in the ED for chest pain symptoms. Patient still intermittently complaining of chest pressure which is recommended she be admitted to the stepdown unit. During his hospitalization patient was evaluated for ACS. Patient's troponins were found to be within normal limits, negative changes on EKG. Cardiology evaluated and feels noncardiac chest pain.  Patient states pain felt more like pressure with palpitations. Patient states does take Phentermine sometimes daily, sometimes every other day. Currently asymptomatic.       Consultants: Dr.Paula Alcus Dad [facility] Cards     Procedure/Significant Events: 1/18 Echocardiogram; - Left ventricle:mild focal basal hypertrophy of the septum. -LVEF= 50% to 55%. -Left atrium: The atrium was mildly dilated. - Atrial septum: atrial septal  aneurysm. 1/18 Angio chest PE protocol; No CT evidence of pulmonary embolism    Discharge Exam: Filed Vitals:   05/03/15 0317 05/03/15 0759 05/03/15 1120 05/03/15 1203  BP: 95/56 108/73   94/76  Pulse:  50 55 52  Temp: 97.9 F (36.6 C) 97.4 F (36.3 C) 97.7 F (36.5 C)   TempSrc: Oral Oral Oral   Resp: 12 16 14 14   Height:      Weight:      SpO2: 96% 98% 100% 100%    General: A/O 4, NAD, occasional chest pressure when ambulating. Cardiovascular: Regular rhythm and rate, negative murmurs rubs or gallops, normal S1/S2 Respiratory: Clear to auscultation bilateral  Discharge Instructions     Medication List    STOP taking these medications        furosemide 40 MG tablet  Commonly known as:  LASIX     phentermine 37.5 MG capsule     phentermine 37.5 MG tablet  Commonly known as:  ADIPEX-P     Potassium Chloride ER 20 MEQ Tbcr     triamterene-hydrochlorothiazide 37.5-25 MG tablet  Commonly known as:  MAXZIDE-25      TAKE these medications        acyclovir 200 MG capsule  Commonly known as:  ZOVIRAX  TAKE ONE CAPSULE BY MOUTH ONCE DAILY IN THE MORNING     diclofenac 75 MG EC tablet  Commonly known as:  VOLTAREN  Take 75 mg by mouth 2 (two) times daily.     PROVENTIL HFA 108 (90 Base) MCG/ACT inhaler  Generic drug:  albuterol  Inhale 2 puffs into the lungs every 4 (four) hours as needed for wheezing or shortness of breath.       Allergies  Allergen Reactions  . Ace Inhibitors Swelling   Follow-up Information    Follow up with Hoyt Koch, MD. Schedule an appointment as soon as possible for a visit in 1 week.   Specialty:  Internal Medicine   Why:  Schedule Follow-up with Dr. Pricilla Holm (PCP) within one week of discharge. Noncardiac chest pain, hypotension, bradycardia   Contact information:   [address] [place] [address] [phone]        The results of significant diagnostics from this hospitalization (including imaging, microbiology, ancillary and laboratory) are listed below for reference.    Significant Diagnostic Studies: Dg Chest 2 View  05/02/2015  CLINICAL DATA:  60 year old female with  shortness of breath EXAM: CHEST  2 VIEW COMPARISON:  Radiograph dated 02/14/2014 FINDINGS: The heart size and mediastinal contours are within normal limits. Both lungs are clear. The visualized skeletal structures are unremarkable. IMPRESSION: No active cardiopulmonary disease. Electronically Signed   By: Anner Crete M.D.   On: 05/02/2015 00:49   Ct Angio Chest Pe W/cm &/or Wo Cm  05/02/2015  CLINICAL DATA:  60 year old female with shortness of breath and chest pain EXAM: CT ANGIOGRAPHY CHEST WITH CONTRAST TECHNIQUE: Multidetector CT imaging of the chest was performed using the standard protocol during bolus administration of intravenous contrast. Multiplanar CT image reconstructions and MIPs were obtained to evaluate the vascular anatomy. CONTRAST:  22mL OMNIPAQUE IOHEXOL 350 MG/ML SOLN COMPARISON:  Radiograph dated 05/02/2015 FINDINGS: The lungs are clear. No pleural effusion or pneumothorax. The central airways are patent. The thoracic aorta appears unremarkable. no CT evidence of pulmonary embolism. There is no cardiomegaly or pericardial effusion. No hilar or mediastinal adenopathy. The esophagus is unremarkable. There is no axillary adenopathy. The chest wall soft  tissues appear unremarkable. There is degenerative changes of the spine. No acute fracture. Gallstones.  The visualized upper abdomen is otherwise unremarkable. Review of the MIP images confirms the above findings. IMPRESSION: No CT evidence of pulmonary embolism. Electronically Signed   By: Anner Crete M.D.   On: 05/02/2015 02:37    Microbiology: Recent Results (from the past 240 hour(s))  MRSA PCR Screening     Status: None   Collection Time: 05/02/15  7:44 AM  Result Value Ref Range Status   MRSA by PCR NEGATIVE NEGATIVE Final    Comment:        The GeneXpert MRSA Assay (FDA approved for NASAL specimens only), is one component of a comprehensive MRSA colonization surveillance program. It is not intended to diagnose  MRSA infection nor to guide or monitor treatment for MRSA infections.      Labs: Basic Metabolic Panel:  Recent Labs Lab 05/02/15 0052 05/02/15 0515 05/03/15 0511  NA 141  --  142  K 3.8  --  3.7  CL 106  --  110  CO2 23  --  25  GLUCOSE 144*  --  87  BUN 21*  --  15  CREATININE 1.22*  --  0.77  CALCIUM 9.1  --  8.7*  MG  --  2.5*  --    Liver Function Tests: No results for input(s): AST, ALT, ALKPHOS, BILITOT, PROT, ALBUMIN in the last 168 hours. No results for input(s): LIPASE, AMYLASE in the last 168 hours. No results for input(s): AMMONIA in the last 168 hours. CBC:  Recent Labs Lab 05/02/15 0052 05/03/15 0511  WBC 8.1 7.5  HGB 12.7 10.9*  HCT 39.1 35.1*  MCV 79.0 78.5  PLT 188 201   Cardiac Enzymes:  Recent Labs Lab 05/02/15 0515 05/02/15 0944 05/02/15 1540 05/02/15 1933 05/02/15 2221  TROPONINI <0.03 <0.03 <0.03 <0.03 <0.03   BNP: BNP (last 3 results) No results for input(s): BNP in the last 8760 hours.  ProBNP (last 3 results) No results for input(s): PROBNP in the last 8760 hours.  CBG: No results for input(s): GLUCAP in the last 168 hours.     Signed:  Dia Crawford, MD [facility] [phone] pager

## 2015-05-03 NOTE — Progress Notes (Signed)
Subjective: Breathing is OK  INtermittent CP  WOrse when turns in bed   Objective: Filed Vitals:   05/02/15 1931 05/02/15 2312 05/03/15 0317 05/03/15 0759  BP: 98/59 84/44 95/56  108/73  Pulse:    50  Temp: 97.7 F (36.5 C) 97.7 F (36.5 C) 97.9 F (36.6 C) 97.4 F (36.3 C)  TempSrc: Oral Oral Oral Oral  Resp: 16 14 12 16   Height:      Weight:      SpO2:  97% 96% 98%   Weight change:   Intake/Output Summary (Last 24 hours) at 05/03/15 [id number] Last data filed at 05/02/15 1800  Gross per 24 hour  Intake    600 ml  Output    400 ml  Net    200 ml    General: Alert, awake, oriented x3, in no acute distress Neck:  JVP is normal Heart: Regular rate and rhythm, without murmurs, rubs, gallops.  Chest  No definite tenderness  Lungs: Clear to auscultation.  No rales or wheezes. Exemities:  No edema.   Neuro: Grossly intact, nonfocal.  TelE  SR    Lab Results: Results for orders placed or performed during the hospital encounter of 05/01/15 (from the past 24 hour(s))  Troponin I-serum (0, 3, 6 hours)     Status: None   Collection Time: 05/02/15  9:44 AM  Result Value Ref Range   Troponin I <0.03 <0.031 ng/mL  Troponin I-serum (0, 3, 6 hours)     Status: None   Collection Time: 05/02/15  3:40 PM  Result Value Ref Range   Troponin I <0.03 <0.031 ng/mL  Troponin I-serum (0, 3, 6 hours)     Status: None   Collection Time: 05/02/15  7:33 PM  Result Value Ref Range   Troponin I <0.03 <0.031 ng/mL  Troponin I-serum (0, 3, 6 hours)     Status: None   Collection Time: 05/02/15 10:21 PM  Result Value Ref Range   Troponin I <0.03 <0.031 ng/mL  Basic metabolic panel     Status: Abnormal   Collection Time: 05/03/15  5:11 AM  Result Value Ref Range   Sodium 142 135 - 145 mmol/L   Potassium 3.7 3.5 - 5.1 mmol/L   Chloride 110 101 - 111 mmol/L   CO2 25 22 - 32 mmol/L   Glucose, Bld 87 65 - 99 mg/dL   BUN 15 6 - 20 mg/dL   Creatinine, Ser 0.77 0.44 - 1.00 mg/dL   Calcium 8.7 (L)  8.9 - 10.3 mg/dL   GFR calc non Af Amer >60 >60 mL/min   GFR calc Af Amer >60 >60 mL/min   Anion gap 7 5 - 15  CBC     Status: Abnormal   Collection Time: 05/03/15  5:11 AM  Result Value Ref Range   WBC 7.5 4.0 - 10.5 K/uL   RBC 4.47 3.87 - 5.11 MIL/uL   Hemoglobin 10.9 (L) 12.0 - 15.0 g/dL   HCT 35.1 (L) 36.0 - 46.0 %   MCV 78.5 78.0 - 100.0 fL   MCH 24.4 (L) 26.0 - 34.0 pg   MCHC 31.1 30.0 - 36.0 g/dL   RDW 18.6 (H) 11.5 - 15.5 %   Platelets 201 150 - 400 K/uL    Studies/Results: No results found.  Medications:Reviewed    @PROBHOSP @  1  CP  Remains somewhat atypical  Responds to MSO4  Worse with deep inspiration sometimes or when turns in bed   CT on admit  showed no coronary calcifications I am reluctant to sched myoview with this and given her size is large  Risk for false positive.  Pain has been atypical     2  HTN  BP is a little low  No meds  Follow   3  GI  Still  With some loose stool        Linda Cabrera 05/03/2015, 8:12 AM

## 2015-05-09 LAB — HEMOGLOBIN A1C
Hgb A1c MFr Bld: 6 % — ABNORMAL HIGH (ref 4.8–5.6)
Mean Plasma Glucose: 126 mg/dL

## 2015-05-10 ENCOUNTER — Ambulatory Visit (INDEPENDENT_AMBULATORY_CARE_PROVIDER_SITE_OTHER): Payer: BLUE CROSS/BLUE SHIELD | Admitting: Internal Medicine

## 2015-05-10 ENCOUNTER — Encounter: Payer: Self-pay | Admitting: Internal Medicine

## 2015-05-10 VITALS — BP 100/70 | HR 53 | Temp 98.5°F | Resp 20 | Ht 63.0 in | Wt 263.2 lb

## 2015-05-10 DIAGNOSIS — I950 Idiopathic hypotension: Secondary | ICD-10-CM

## 2015-05-10 DIAGNOSIS — R0600 Dyspnea, unspecified: Secondary | ICD-10-CM | POA: Diagnosis not present

## 2015-05-10 DIAGNOSIS — R0789 Other chest pain: Secondary | ICD-10-CM

## 2015-05-10 MED ORDER — ALBUTEROL SULFATE HFA 108 (90 BASE) MCG/ACT IN AERS: 2.0000 | INHALATION_SPRAY | Freq: Four times a day (QID) | RESPIRATORY_TRACT | Status: DC | PRN

## 2015-05-10 MED ORDER — ASPIRIN EC 81 MG PO TBEC: 81.0000 mg | DELAYED_RELEASE_TABLET | Freq: Every day | ORAL | Status: DC

## 2015-05-10 MED ORDER — METHYLPREDNISOLONE ACETATE 80 MG/ML IJ SUSP
80.0000 mg | Freq: Once | INTRAMUSCULAR | Status: AC
  Administered 2015-05-10: 80 mg via INTRAMUSCULAR

## 2015-05-10 MED ORDER — PREDNISONE 10 MG PO TABS: ORAL_TABLET | ORAL | Status: DC

## 2015-05-10 NOTE — Patient Instructions (Signed)
You had the steroid shot today  Please take all new medication as prescribed - the prednisone  Please continue all other medications as before, and refills have been done if requested - the inhaler  Please call if you get better then worse again, as you may need to take a steroid inhaler as well in the future  Please have the pharmacy call with any other refills you may need.  Please keep your appointments with your specialists as you may have planned

## 2015-05-10 NOTE — Addendum Note (Signed)
Addended by: Della Goo C on: 05/10/2015 10:57 AM   Modules accepted: Orders

## 2015-05-10 NOTE — Progress Notes (Signed)
Pre visit review using our clinic review tool, if applicable. No additional management support is needed unless otherwise documented below in the visit note. 

## 2015-05-10 NOTE — Addendum Note (Signed)
Addended by: Biagio Borg on: 05/10/2015 10:48 AM   Modules accepted: Orders

## 2015-05-10 NOTE — Assessment & Plan Note (Signed)
Asympt, would not take fluid pills for now to not exacerbate, no evidence for volume increased today, suspect her perception of chest fullness is asthma related, not pulm edema, has not evidence for cardiorenal abnormality to justify diuretic use, should use compression stocking and leg elevation, low salt, wt loss for any worsening edema likely c/w venous insuff.

## 2015-05-10 NOTE — Assessment & Plan Note (Signed)
Suspect subtle mild asthma exacerbation, possibly related to recent ill grandkids exposure, for depomedrol IM, predpac asd, refilled albut MDI,  Consider qvar or similar if improves, then worsens again as may actually have mild persistent rather than mild intermittent asthma

## 2015-05-10 NOTE — Assessment & Plan Note (Signed)
Recent cardiac eval neg, CTA chest o/w neg,  to f/u any worsening symptoms or concerns, con t asa 81 qd

## 2015-05-10 NOTE — Progress Notes (Signed)
Subjective:    Patient ID: Linda Cabrera, female    DOB: [birth year], 60 y.o.   MRN: [id number]  HPI  Here to f/u recent hospn for noncardiac chest pain, was taken lasix 40 and tiram/hct, but feels may have some fluid may be reaccumulating, getting "full" and "tightness" in the chest.  Recent echo jan 18 with EF 50-55%, no diast dysfxn. Has some swelling to feet later in the day only in the last few days off the fluid pills.  Saw podiatry recently, had bilat cortisone shots to heels for plantar fasciitis and heel spurs per pt.  Wt apperas to have gone down after left hospital.  Has not tried inhaler use for asthma since hosp as she does not have inhaler.  Pt denies fever, wt loss, night sweats, loss of appetite, or other constitutional symptoms, grandkids have been sick with sinus infections recently. BP Readings from Last 3 Encounters:  05/10/15 100/70  05/03/15 94/76  01/24/15 118/88   Wt Readings from Last 3 Encounters:  05/10/15 263 lb 4 oz (119.409 kg)  05/01/15 270 lb (122.471 kg)  01/24/15 265 lb 9.6 oz (120.475 kg)   Past Medical History  Diagnosis Date  . Asthma   . Hypertension   . Herpes   . Arthritis    Past Surgical History  Procedure Laterality Date  . Colonoscopy N/A 08/15/2013    Procedure: COLONOSCOPY;  Surgeon: Jerene Bears, MD;  Location: [facility];  Service: Gastroenterology;  Laterality: N/A;  . Breath tek h pylori N/A 02/13/2014    Procedure: BREATH TEK H PYLORI;  Surgeon: Gayland Curry, MD;  Location: [facility];  Service: General;  Laterality: N/A;  . Tubal ligation      reports that she quit smoking about 10 years ago. She has never used smokeless tobacco. She reports that she does not drink alcohol or use illicit drugs. family history includes Arthritis in her father and mother; Cancer in her mother; Hypertension in her father and mother; Pancreatic cancer in her mother. There is no history of Colon cancer. Allergies  Allergen Reactions  . Ace  Inhibitors Swelling   Current Outpatient Prescriptions on File Prior to Visit  Medication Sig Dispense Refill  . acyclovir (ZOVIRAX) 200 MG capsule TAKE ONE CAPSULE BY MOUTH ONCE DAILY IN THE MORNING 90 capsule 2  . diclofenac (VOLTAREN) 75 MG EC tablet Take 75 mg by mouth 2 (two) times daily.  0  . PROVENTIL HFA 108 (90 BASE) MCG/ACT inhaler Inhale 2 puffs into the lungs every 4 (four) hours as needed for wheezing or shortness of breath.      No current facility-administered medications on file prior to visit.   Review of Systems  Constitutional: Negative for unusual diaphoresis or night sweats HENT: Negative for ringing in ear or discharge Eyes: Negative for double vision or worsening visual disturbance.  Respiratory: Negative for choking and stridor.   Gastrointestinal: Negative for vomiting or other signifcant bowel change Genitourinary: Negative for hematuria or change in urine volume.  Musculoskeletal: Negative for other MSK pain or swelling Skin: Negative for color change and worsening wound.  Neurological: Negative for tremors and numbness other than noted  Psychiatric/Behavioral: Negative for decreased concentration or agitation other than above       Objective:   Physical Exam BP 100/70 mmHg  Pulse 53  Temp(Src) 98.5 F (36.9 C) (Oral)  Resp 20  Ht 5\' 3"  (1.6 m)  Wt 263 lb 4 oz (119.409 kg)  BMI  46.64 kg/m2  SpO2 97%  LMP 08/25/2014  VS noted, non toxic Constitutional: Pt appears in no significant distress HENT: Head: NCAT.  Right Ear: External ear normal.  Left Ear: External ear normal.  Bilat tm's with mild erythema.  Max sinus areas non tender.  Pharynx with mild erythema, no exudate Eyes: . Pupils are equal, round, and reactive to light. Conjunctivae and EOM are normal Neck: Normal range of motion. Neck supple.  Cardiovascular: Normal rate and regular rhythm.   Pulmonary/Chest: Effort normal and breath sounds decreased without rales or wheezing.    Neurological: Pt is alert. Not confused , motor grossly intact Skin: Skin is warm. No rash, no LE edema Psychiatric: Pt behavior is normal. No agitation.      Assessment & Plan:

## 2015-07-13 ENCOUNTER — Other Ambulatory Visit (INDEPENDENT_AMBULATORY_CARE_PROVIDER_SITE_OTHER): Payer: BLUE CROSS/BLUE SHIELD

## 2015-07-13 ENCOUNTER — Ambulatory Visit (INDEPENDENT_AMBULATORY_CARE_PROVIDER_SITE_OTHER): Payer: BLUE CROSS/BLUE SHIELD | Admitting: Internal Medicine

## 2015-07-13 ENCOUNTER — Other Ambulatory Visit: Payer: Self-pay | Admitting: Internal Medicine

## 2015-07-13 ENCOUNTER — Encounter: Payer: Self-pay | Admitting: Internal Medicine

## 2015-07-13 VITALS — BP 130/68 | HR 79 | Temp 97.8°F | Resp 20 | Wt 266.0 lb

## 2015-07-13 DIAGNOSIS — D649 Anemia, unspecified: Secondary | ICD-10-CM

## 2015-07-13 DIAGNOSIS — R600 Localized edema: Secondary | ICD-10-CM | POA: Insufficient documentation

## 2015-07-13 DIAGNOSIS — J4520 Mild intermittent asthma, uncomplicated: Secondary | ICD-10-CM

## 2015-07-13 DIAGNOSIS — R609 Edema, unspecified: Secondary | ICD-10-CM

## 2015-07-13 HISTORY — DX: Anemia, unspecified: D64.9

## 2015-07-13 LAB — URINALYSIS, ROUTINE W REFLEX MICROSCOPIC
BILIRUBIN URINE: NEGATIVE
HGB URINE DIPSTICK: NEGATIVE
Ketones, ur: NEGATIVE
LEUKOCYTES UA: NEGATIVE
NITRITE: NEGATIVE
PH: 6 (ref 5.0–8.0)
RBC / HPF: NONE SEEN (ref 0–?)
Specific Gravity, Urine: 1.02 (ref 1.000–1.030)
TOTAL PROTEIN, URINE-UPE24: NEGATIVE
UROBILINOGEN UA: 0.2 (ref 0.0–1.0)
Urine Glucose: NEGATIVE
WBC, UA: NONE SEEN (ref 0–?)

## 2015-07-13 LAB — HEPATIC FUNCTION PANEL
ALT: 17 U/L (ref 0–35)
AST: 17 U/L (ref 0–37)
Albumin: 3.9 g/dL (ref 3.5–5.2)
Alkaline Phosphatase: 104 U/L (ref 39–117)
BILIRUBIN TOTAL: 0.3 mg/dL (ref 0.2–1.2)
Bilirubin, Direct: 0.1 mg/dL (ref 0.0–0.3)
TOTAL PROTEIN: 7.4 g/dL (ref 6.0–8.3)

## 2015-07-13 LAB — CBC WITH DIFFERENTIAL/PLATELET
BASOS ABS: 0 10*3/uL (ref 0.0–0.1)
BASOS PCT: 0.3 % (ref 0.0–3.0)
Eosinophils Absolute: 0.1 10*3/uL (ref 0.0–0.7)
Eosinophils Relative: 1.7 % (ref 0.0–5.0)
HCT: 39.4 % (ref 36.0–46.0)
HEMOGLOBIN: 12.9 g/dL (ref 12.0–15.0)
LYMPHS ABS: 1.8 10*3/uL (ref 0.7–4.0)
LYMPHS PCT: 21.6 % (ref 12.0–46.0)
MCHC: 32.8 g/dL (ref 30.0–36.0)
MCV: 77.9 fl — AB (ref 78.0–100.0)
MONOS PCT: 6.8 % (ref 3.0–12.0)
Monocytes Absolute: 0.6 10*3/uL (ref 0.1–1.0)
NEUTROS PCT: 69.6 % (ref 43.0–77.0)
Neutro Abs: 5.7 10*3/uL (ref 1.4–7.7)
Platelets: 234 10*3/uL (ref 150.0–400.0)
RBC: 5.06 Mil/uL (ref 3.87–5.11)
RDW: 18.8 % — ABNORMAL HIGH (ref 11.5–15.5)
WBC: 8.1 10*3/uL (ref 4.0–10.5)

## 2015-07-13 LAB — BASIC METABOLIC PANEL
BUN: 19 mg/dL (ref 6–23)
CALCIUM: 9.3 mg/dL (ref 8.4–10.5)
CHLORIDE: 105 meq/L (ref 96–112)
CO2: 32 mEq/L (ref 19–32)
CREATININE: 0.82 mg/dL (ref 0.40–1.20)
GFR: 91.99 mL/min (ref >60.00)
Glucose, Bld: 86 mg/dL (ref 70–99)
Potassium: 3.7 mEq/L (ref 3.5–5.1)
Sodium: 143 mEq/L (ref 135–145)

## 2015-07-13 LAB — IBC PANEL
Iron: 50 ug/dL (ref 42–145)
Saturation Ratios: 12.4 % — ABNORMAL LOW (ref 20.0–50.0)
Transferrin: 288 mg/dL (ref 212.0–360.0)

## 2015-07-13 MED ORDER — FUROSEMIDE 20 MG PO TABS
20.0000 mg | ORAL_TABLET | Freq: Every day | ORAL | Status: DC | PRN
Start: 2015-07-13 — End: 2015-09-17

## 2015-07-13 NOTE — Assessment & Plan Note (Signed)
?   worseinng leading to edema - also for re-check today with labs,  to f/u any worsening symptoms or concerns

## 2015-07-13 NOTE — Progress Notes (Signed)
Subjective:    Patient ID: Linda Cabrera, female    DOB: Dec 31, 1955, 60 y.o.   MRN: [id number]  HPI  Here with bilat swelling, somewhat acute on chronic; has been recurring problem in the past yrs, for some reason worse now, No longer takes the prednisone, but does take prn nsaid, but last renal fxn ok , though has listed hx renal insufficiency on EMR.  Has hx of asthma as well, but not active, no hx of pulm htn. Swelling much improved with lying down at night, but much worse by end of day in the past wk, very uncomfortable, has taken lasix in the past that seemed to help. Pt denies chest pain, increased sob or doe, wheezing, orthopnea, PND, increased LE swelling, palpitations, dizziness or syncope, except for the above.  Pt denies new neurological symptoms such as new headache, or facial or extremity weakness or numbness   Pt denies polydipsia, polyuria,  No recent overt bleeding.  Recent anemia noted, with normal b12 may 2016, normal iron oct 2015. No other significnat prior hx CV/pulm/renal/hepatic dz, recent thyroid ok, and no bilat knee effusions or pain  No prior hx of DVT Past Medical History  Diagnosis Date  . Asthma   . Hypertension   . Herpes   . Arthritis   . Anemia, unspecified 07/13/2015   Past Surgical History  Procedure Laterality Date  . Colonoscopy N/A 08/15/2013    Procedure: COLONOSCOPY;  Surgeon: Jerene Bears, MD;  Location: [facility];  Service: Gastroenterology;  Laterality: N/A;  . Breath tek h pylori N/A 02/13/2014    Procedure: BREATH TEK H PYLORI;  Surgeon: Gayland Curry, MD;  Location: [facility];  Service: General;  Laterality: N/A;  . Tubal ligation      reports that she quit smoking about 11 years ago. She has never used smokeless tobacco. She reports that she does not drink alcohol or use illicit drugs. family history includes Arthritis in her father and mother; Cancer in her mother; Hypertension in her father and mother; Pancreatic cancer in her mother. There  is no history of Colon cancer. Allergies  Allergen Reactions  . Ace Inhibitors Swelling   Current Outpatient Prescriptions on File Prior to Visit  Medication Sig Dispense Refill  . acyclovir (ZOVIRAX) 200 MG capsule TAKE ONE CAPSULE BY MOUTH ONCE DAILY IN THE MORNING 90 capsule 2  . albuterol (PROVENTIL HFA) 108 (90 Base) MCG/ACT inhaler Inhale 2 puffs into the lungs every 6 (six) hours as needed for wheezing or shortness of breath. 18 g 11  . aspirin EC 81 MG tablet Take 1 tablet (81 mg total) by mouth daily. 90 tablet 11  . diclofenac (VOLTAREN) 75 MG EC tablet Take 75 mg by mouth 2 (two) times daily.  0   No current facility-administered medications on file prior to visit.     Review of Systems  Constitutional: Negative for unusual diaphoresis or night sweats HENT: Negative for ear swelling or discharge Eyes: Negative for worsening visual haziness  Respiratory: Negative for choking and stridor.   Gastrointestinal: Negative for distension or worsening eructation Genitourinary: Negative for retention or change in urine volume.  Musculoskeletal: Negative for other MSK pain or swelling Skin: Negative for color change and worsening wound Neurological: Negative for tremors and numbness other than noted  Psychiatric/Behavioral: Negative for decreased concentration or agitation other than above  '    Objective:   Physical Exam BP 130/68 mmHg  Pulse 79  Temp(Src) 97.8 F (36.6  C) (Oral)  Resp 20  Wt 266 lb (120.657 kg)  SpO2 96%  LMP 08/25/2014 VS noted,  Constitutional: Pt appears in no apparent distress HENT: Head: NCAT.  Right Ear: External ear normal.  Left Ear: External ear normal.  Eyes: . Pupils are equal, round, and reactive to light. Conjunctivae and EOM are normal Neck: Normal range of motion. Neck supple.  Cardiovascular: Normal rate and regular rhythm.   Pulmonary/Chest: Effort normal and breath sounds without rales or wheezing.  Abd:  Soft, NT, ND, +  BS Neurological: Pt is alert. Not confused , motor grossly intact Skin: Skin is warm. No rash, trace to 1+ bilat LE edema to knees, no ulcers or erythema Psychiatric: Pt behavior is normal. No agitation.    Lab Results  Component Value Date   WBC 7.5 05/03/2015   HGB 10.9* 05/03/2015   HCT 35.1* 05/03/2015   PLT 201 05/03/2015   GLUCOSE 87 05/03/2015   CHOL 134 05/03/2015   TRIG 41 05/03/2015   HDL 55 05/03/2015   LDLCALC 71 05/03/2015   ALT 11 08/24/2014   AST 16 08/24/2014   NA 142 05/03/2015   K 3.7 05/03/2015   CL 110 05/03/2015   CREATININE 0.77 05/03/2015   BUN 15 05/03/2015   CO2 25 05/03/2015   TSH 0.463 05/02/2015   HGBA1C 6.0* 05/02/2015    Most recent Echo and CT Chest:  Result status: Final result      *[facility]*  [address], [place] [address]  [phone]  ------------------------------------------------------------------- Transthoracic Echocardiography  Patient: Linda, Cabrera MR #: [id number] Study Date: 05/02/2015 Gender: F Age: 43 Height: 160 cm Weight: 122.5 kg BSA: 2.41 m^2 Pt. Status: Room: [facility], Inpatient ADMITTING [facility] Plan A ORDERING Smith, Rondell A REFERRING Smith, Rondell A  cc:  ------------------------------------------------------------------- LV EF: 50% - 55%  ------------------------------------------------------------------- Indications: Chest pain 786.51.  ------------------------------------------------------------------- History: PMH: Asthma. Obesity. Risk factors: Hypertension.  ------------------------------------------------------------------- Study Conclusions  - Left ventricle: The  cavity size was normal. There was mild focal  basal hypertrophy of the septum. Systolic function was normal.  The estimated ejection fraction was in the range of 50% to 55%.  Wall motion was normal; there were no regional wall motion  abnormalities. Left ventricular diastolic function parameters  were normal. - Left atrium: The atrium was mildly dilated. - Atrial septum: There was an atrial septal aneurysm.  Impressions:  - Definity used; normal LV function; mild LAE; trace MR and TR.    CLINICAL DATA: 60 year old female with shortness of breath and chest pain  EXAM: CT ANGIOGRAPHY CHEST WITH CONTRAST  TECHNIQUE: Multidetector CT imaging of the chest was performed using the standard protocol during bolus administration of intravenous contrast. Multiplanar CT image reconstructions and MIPs were obtained to evaluate the vascular anatomy.  CONTRAST: 55mL OMNIPAQUE IOHEXOL 350 MG/ML SOLN  COMPARISON: Radiograph dated 05/02/2015  FINDINGS: The lungs are clear. No pleural effusion or pneumothorax. The central airways are patent.  The thoracic aorta appears unremarkable. no CT evidence of pulmonary embolism. There is no cardiomegaly or pericardial effusion. No hilar or mediastinal adenopathy. The esophagus is unremarkable. There is no axillary adenopathy. The chest wall soft tissues appear unremarkable. There is degenerative changes of the spine. No acute fracture.  Gallstones. The visualized upper abdomen is otherwise unremarkable.  Review of the MIP images confirms the above findings.  IMPRESSION: No CT evidence of  pulmonary embolism.   Electronically Signed  By: Anner Crete M.D.  On: 05/02/2015 02:37    Assessment & Plan:

## 2015-07-13 NOTE — Assessment & Plan Note (Signed)
stable overall by history and exam, recent data reviewed with pt, and pt to continue medical treatment as before,  to f/u any worsening symptoms or concerns SpO2 Readings from Last 3 Encounters:  07/13/15 96%  05/10/15 97%  05/03/15 100%

## 2015-07-13 NOTE — Patient Instructions (Addendum)
Please take all new medication as prescribed - the low dose lasix  You are also given the prescription for the Compression stockings to wear during the day only  Please continue all other medications as before, and refills have been done if requested.  Please have the pharmacy call with any other refills you may need.  Please continue your efforts at being more active, low cholesterol diet, and weight control, as well low sodium and leg elevation with sitting.  Please keep your appointments with your specialists as you may have planned  Please go to the LAB in the Basement (turn left off the elevator) for the tests to be done today  You will be contacted by phone if any changes need to be made immediately.  Otherwise, you will receive a letter about your results with an explanation, but please check with MyChart first.  Please remember to sign up for MyChart if you have not done so, as this will be important to you in the future with finding out test results, communicating by private email, and scheduling acute appointments online when needed.  Please return in 2 months, or sooner if needed, to Dr Sharlet Salina

## 2015-07-13 NOTE — Assessment & Plan Note (Signed)
Most c/w venous insufficiency, for lasix 20 qd prn for discomfort, but maiin tx should be leg elevation, low sodium diet, wt loss, and compression stockings,  to f/u any worsening symptoms or concerns

## 2015-07-13 NOTE — Progress Notes (Signed)
Pre visit review using our clinic review tool, if applicable. No additional management support is needed unless otherwise documented below in the visit note. 

## 2015-07-18 ENCOUNTER — Other Ambulatory Visit: Payer: Self-pay | Admitting: Internal Medicine

## 2015-08-01 ENCOUNTER — Other Ambulatory Visit: Payer: Self-pay

## 2015-08-01 DIAGNOSIS — Z1231 Encounter for screening mammogram for malignant neoplasm of breast: Secondary | ICD-10-CM

## 2015-08-27 ENCOUNTER — Telehealth: Payer: Self-pay

## 2015-08-27 NOTE — Telephone Encounter (Signed)
HCTZ requested. Informed pt that HCTZ was discontinued and that she should be taking the furosemide. Also informed pt that she is due for a 2 month follow up with PCP. That appt has been made. No rx's were sent in today.

## 2015-09-07 ENCOUNTER — Ambulatory Visit
Admission: RE | Admit: 2015-09-07 | Discharge: 2015-09-07 | Disposition: A | Discharge status: Home / Self Care | Payer: BLUE CROSS/BLUE SHIELD | Source: Ambulatory Visit

## 2015-09-07 DIAGNOSIS — Z1231 Encounter for screening mammogram for malignant neoplasm of breast: Secondary | ICD-10-CM

## 2015-09-17 ENCOUNTER — Ambulatory Visit (INDEPENDENT_AMBULATORY_CARE_PROVIDER_SITE_OTHER): Payer: BLUE CROSS/BLUE SHIELD | Admitting: Internal Medicine

## 2015-09-17 ENCOUNTER — Ambulatory Visit (INDEPENDENT_AMBULATORY_CARE_PROVIDER_SITE_OTHER)
Admission: RE | Admit: 2015-09-17 | Discharge: 2015-09-17 | Disposition: A | Discharge status: Home / Self Care | Payer: BLUE CROSS/BLUE SHIELD | Source: Ambulatory Visit | Attending: Internal Medicine | Admitting: Internal Medicine

## 2015-09-17 ENCOUNTER — Encounter: Payer: Self-pay | Admitting: Internal Medicine

## 2015-09-17 VITALS — BP 158/70 | HR 65 | Temp 98.3°F | Resp 18 | Ht 63.0 in | Wt 279.4 lb

## 2015-09-17 DIAGNOSIS — R0600 Dyspnea, unspecified: Secondary | ICD-10-CM | POA: Diagnosis not present

## 2015-09-17 DIAGNOSIS — R05 Cough: Secondary | ICD-10-CM

## 2015-09-17 DIAGNOSIS — R059 Cough, unspecified: Secondary | ICD-10-CM

## 2015-09-17 MED ORDER — FUROSEMIDE 40 MG PO TABS: 40.0000 mg | ORAL_TABLET | Freq: Every day | ORAL | Status: DC

## 2015-09-17 MED ORDER — NALTREXONE-BUPROPION HCL ER 8-90 MG PO TB12: ORAL_TABLET | ORAL | Status: DC

## 2015-09-17 NOTE — Progress Notes (Signed)
   Subjective:    Patient ID: Linda Cabrera, female    DOB: [birth year], 60 y.o.   MRN: [id number]  HPI The patient is a 60 YO female coming in for follow up of her weight. She was taken off phentermine during recent hospitalization with some chest pains. She has then taken some prednisone for her breathing in the last 2 months which caused her to gain 10 pounds. She thinks that some of this is fluid and is bothering her breathing and swelling her stomach. She is now taking lasix 20 mg daily and feels it is not enough as she used to be on 40 mg daily. SOB with activity even as limited as tying her shoes.   Review of Systems  Constitutional: Negative for chills, activity change, appetite change and fatigue.  Eyes: Negative.   Respiratory: Positive for shortness of breath. Negative for cough, chest tightness and wheezing.   Cardiovascular: Positive for leg swelling. Negative for chest pain and palpitations.  Gastrointestinal: Negative for abdominal pain, diarrhea, constipation and abdominal distention.  Musculoskeletal: Positive for arthralgias and gait problem.  Skin: Negative.   Neurological: Negative for dizziness and headaches.  Psychiatric/Behavioral: Negative.       Objective:   Physical Exam  Constitutional: She appears well-developed and well-nourished.  Obese  HENT:  Head: Normocephalic and atraumatic.  Eyes: EOM are normal.  Neck: Normal range of motion.  Cardiovascular: Normal rate and regular rhythm.   Pulmonary/Chest: Effort normal. No respiratory distress. She has no wheezes.  Breath sounds decreased due to body habitus  Abdominal: Soft. Bowel sounds are normal. She exhibits no distension. There is no tenderness. There is no rebound.  Vitals reviewed.  Filed Vitals:   09/17/15 1350  BP: 158/70  Pulse: 65  Temp: 98.3 F (36.8 C)  TempSrc: Oral  Resp: 18  Height: 5\' 3"  (1.6 m)  Weight: 279 lb 6.4 oz (126.735 kg)  SpO2: 93%      Assessment & Plan:

## 2015-09-17 NOTE — Assessment & Plan Note (Signed)
She is not able to take phentermine anymore and rx for contrave given at visit. Also given coupon. Suspect that her 10 pound weight gain has something to do with her increase in SOB.

## 2015-09-17 NOTE — Patient Instructions (Signed)
We will check the chest x-ray today and call you back with the results.   We have sent in the furosemide 40 mg to take for the fluid.   We have sent in contrave the weight loss medicine which you take: week 1 take 1 pill daily, week 2 1 pill twice daily, week 3 take 2 pills in the morning and 1 pill evening, week 4 onwards take 2 pills twice daily.

## 2015-09-17 NOTE — Progress Notes (Signed)
Pre visit review using our clinic review tool, if applicable. No additional management support is needed unless otherwise documented below in the visit note. 

## 2015-09-17 NOTE — Assessment & Plan Note (Signed)
Checking CXR as no audible wheezing on exam. Will increase her lasix to 40 mg daily but if weight gain is indeed steroid induced it is not likely to be all fluid.

## 2015-09-19 ENCOUNTER — Telehealth: Payer: Self-pay

## 2015-09-19 NOTE — Telephone Encounter (Signed)
PA initiated via CoverMyMeds key [facility]

## 2015-09-24 MED ORDER — LORCASERIN HCL 10 MG PO TABS: 10.0000 mg | ORAL_TABLET | Freq: Two times a day (BID) | ORAL | Status: DC

## 2015-09-24 NOTE — Telephone Encounter (Signed)
PA DENIED - Weight Loss Drugs are not covered under pt's plan

## 2015-09-24 NOTE — Telephone Encounter (Signed)
Pt advised in detail via personal VM. Rx placed in cabinet for pt pick up

## 2015-09-24 NOTE — Telephone Encounter (Signed)
Belviq printed and signed to try instead.

## 2015-09-24 NOTE — Telephone Encounter (Signed)
Correction: plan will ONLY cover Belviq and Qsymia

## 2015-11-20 ENCOUNTER — Telehealth: Payer: Self-pay | Admitting: Internal Medicine

## 2015-11-20 NOTE — Telephone Encounter (Signed)
Patient never came to pick up lorcaserin script from 09/24/2015. Disposing of it

## 2015-11-20 NOTE — Telephone Encounter (Signed)
Fine, thanks. 

## 2015-11-29 ENCOUNTER — Other Ambulatory Visit: Payer: Self-pay | Admitting: Internal Medicine

## 2016-01-28 ENCOUNTER — Other Ambulatory Visit: Payer: Self-pay | Admitting: Internal Medicine

## 2016-02-08 ENCOUNTER — Telehealth: Payer: Self-pay

## 2016-02-08 NOTE — Telephone Encounter (Signed)
I do not know which medicine she is talking about.

## 2016-02-08 NOTE — Telephone Encounter (Signed)
Pt lmom rq rf of steroid pills. I do not see the medication that pt is rq. Please advise.

## 2016-02-08 NOTE — Telephone Encounter (Signed)
LVM for pt to call back as soon as possible.   

## 2016-02-11 ENCOUNTER — Other Ambulatory Visit: Payer: Self-pay | Admitting: Internal Medicine

## 2016-04-27 ENCOUNTER — Other Ambulatory Visit: Payer: Self-pay | Admitting: Internal Medicine

## 2016-04-28 ENCOUNTER — Telehealth: Payer: Self-pay

## 2016-04-28 NOTE — Telephone Encounter (Signed)
I'm not sure why we have another PA for this. She never picked up script for belviq. Call her and see if she wants to try belviq.

## 2016-04-28 NOTE — Telephone Encounter (Signed)
covermymeds rejected prescription for contrave. Patients insurance will not cover it. Belvique is covered by patients insurance.

## 2016-04-29 ENCOUNTER — Other Ambulatory Visit: Payer: Self-pay | Admitting: *Deleted

## 2016-04-29 MED ORDER — ACYCLOVIR 200 MG PO CAPS: ORAL_CAPSULE | ORAL | 0 refills | Status: DC

## 2016-04-29 NOTE — Telephone Encounter (Signed)
Rec'd call pt requesting refill on the acyclovir. Verified pharmacy inform will send to [place]...[name]

## 2016-05-12 ENCOUNTER — Other Ambulatory Visit: Payer: Self-pay | Admitting: Internal Medicine

## 2016-05-20 ENCOUNTER — Other Ambulatory Visit: Payer: Self-pay | Admitting: Internal Medicine

## 2016-06-09 ENCOUNTER — Other Ambulatory Visit: Payer: Self-pay | Admitting: Internal Medicine

## 2016-06-09 MED ORDER — ACYCLOVIR 200 MG PO CAPS
ORAL_CAPSULE | ORAL | 0 refills | Status: DC
Start: 2016-06-09 — End: 2016-11-10

## 2016-07-10 ENCOUNTER — Ambulatory Visit (INDEPENDENT_AMBULATORY_CARE_PROVIDER_SITE_OTHER): Payer: BLUE CROSS/BLUE SHIELD | Admitting: Internal Medicine

## 2016-07-10 DIAGNOSIS — J4520 Mild intermittent asthma, uncomplicated: Secondary | ICD-10-CM

## 2016-07-10 MED ORDER — MONTELUKAST SODIUM 10 MG PO TABS: 10.0000 mg | ORAL_TABLET | Freq: Every day | ORAL | 3 refills | Status: DC

## 2016-07-10 MED ORDER — BENZONATATE 200 MG PO CAPS: 200.0000 mg | ORAL_CAPSULE | Freq: Three times a day (TID) | ORAL | 0 refills | Status: DC | PRN

## 2016-07-10 MED ORDER — HYDROCODONE-HOMATROPINE 5-1.5 MG/5ML PO SYRP: 5.0000 mL | ORAL_SOLUTION | Freq: Three times a day (TID) | ORAL | 0 refills | Status: DC | PRN

## 2016-07-10 NOTE — Patient Instructions (Addendum)
We have sent in medicine for the cough which you can take during the day called tessalon perles. You can take these up to 3 times per day.  We have also given you a stronger cough medicine for cough that you can take at night time which has some codeine medicine in it and can make you drowsy so we do not recommend to take during the day.   We have sent in a medicine called singulair which helps with stopping the cough and you should take daily for the next 1-2 weeks at least (or you can take while the pollen is out).   The medicines for weight include: contrave, belviq, orlistat (alli).

## 2016-07-10 NOTE — Assessment & Plan Note (Addendum)
Rx for tessalon perles and hycodan for cough. Rx for singulair for drainage and allergies which are likely driving the cough. Can continue with albuterol prn. No wheezing on exam to warrant prednisone or antibiotics. Advised to avoid smoking and triggers to set off her allergies/asthma.

## 2016-07-10 NOTE — Progress Notes (Signed)
   Subjective:    Patient ID: Linda Cabrera, female    DOB: 20-Dec-1955, 61 y.o.   MRN: [id number]  HPI The patient is a 61 YO female coming in for problems with coughing. She is using her albuterol inhaler but feels that it is not working. She is also having some drainage. She was around some smokers last week and this is when the symptoms started. She denies fevers or chills. Some ear pain and pressure. No headaches new or migraines. She is using albuterol TID for coughing and this is doing well for breathing. Denies change in activity from breathing.   Review of Systems  Constitutional: Negative for activity change, appetite change, chills, fatigue, fever and unexpected weight change.  HENT: Positive for congestion, ear pain and rhinorrhea. Negative for ear discharge, facial swelling, nosebleeds, postnasal drip, sinus pain, sinus pressure, sore throat, trouble swallowing and voice change.   Eyes: Negative.   Respiratory: Positive for cough and wheezing. Negative for chest tightness and shortness of breath.   Cardiovascular: Negative.   Gastrointestinal: Negative.   Musculoskeletal: Negative.   Neurological: Negative.       Objective:   Physical Exam  Constitutional: She is oriented to person, place, and time. She appears well-developed and well-nourished.  Obese  HENT:  Head: Normocephalic and atraumatic.  Right Ear: External ear normal.  Left Ear: External ear normal.  Oropharynx with redness and clear drainage, nose without crusting, no sinus pressure.   Eyes: EOM are normal.  Neck: Normal range of motion. No JVD present.  Cardiovascular: Normal rate and regular rhythm.   Pulmonary/Chest: Effort normal and breath sounds normal.  Upper bronchial sounds, no wheezing or rhonchi on exam  Abdominal: Soft. She exhibits no distension. There is no tenderness.  Lymphadenopathy:    She has no cervical adenopathy.  Neurological: She is alert and oriented to person, place, and time.    Skin: Skin is warm and dry.   Vitals:   07/10/16 1044  BP: 138/84  Pulse: 77  Temp: 97.8 F (36.6 C)  SpO2: 97%  Weight: 295 lb 0.6 oz (133.8 kg)  Height: 5\' 3"  (1.6 m)      Assessment & Plan:

## 2016-07-10 NOTE — Progress Notes (Signed)
Pre visit review using our clinic review tool, if applicable. No additional management support is needed unless otherwise documented below in the visit note. 

## 2016-07-29 ENCOUNTER — Other Ambulatory Visit: Payer: Self-pay | Admitting: Internal Medicine

## 2016-07-29 DIAGNOSIS — Z1231 Encounter for screening mammogram for malignant neoplasm of breast: Secondary | ICD-10-CM

## 2016-08-01 ENCOUNTER — Other Ambulatory Visit: Payer: Self-pay | Admitting: Internal Medicine

## 2016-08-01 NOTE — Telephone Encounter (Signed)
Is refill on acyclovir appropriate

## 2016-08-04 ENCOUNTER — Other Ambulatory Visit: Payer: Self-pay | Admitting: Internal Medicine

## 2016-08-08 ENCOUNTER — Other Ambulatory Visit: Payer: Self-pay | Admitting: Internal Medicine

## 2016-08-16 ENCOUNTER — Other Ambulatory Visit: Payer: Self-pay | Admitting: Internal Medicine

## 2016-09-03 ENCOUNTER — Telehealth: Payer: Self-pay | Admitting: Internal Medicine

## 2016-09-03 NOTE — Telephone Encounter (Signed)
Pt states she is out of acyclovir (ZOVIRAX) 200 MG capsule In stead of taking once a day she takes 2. Would like a refill sent in her pharmacy. Same pharmacy is being used.

## 2016-09-03 NOTE — Telephone Encounter (Signed)
Is this appropriate?  

## 2016-09-04 ENCOUNTER — Other Ambulatory Visit: Payer: Self-pay | Admitting: Internal Medicine

## 2016-09-04 NOTE — Telephone Encounter (Signed)
Should just be taking for flares. Okay to refill.

## 2016-09-04 NOTE — Telephone Encounter (Signed)
Called again regarding this and states she is breaking out.

## 2016-09-05 ENCOUNTER — Other Ambulatory Visit: Payer: Self-pay | Admitting: Internal Medicine

## 2016-09-09 ENCOUNTER — Ambulatory Visit: Payer: BLUE CROSS/BLUE SHIELD

## 2016-09-26 ENCOUNTER — Ambulatory Visit: Payer: BLUE CROSS/BLUE SHIELD

## 2016-10-07 ENCOUNTER — Other Ambulatory Visit: Payer: Self-pay | Admitting: Internal Medicine

## 2016-10-20 ENCOUNTER — Other Ambulatory Visit: Payer: Self-pay | Admitting: Internal Medicine

## 2016-10-24 ENCOUNTER — Other Ambulatory Visit: Payer: Self-pay | Admitting: Family

## 2016-11-10 ENCOUNTER — Other Ambulatory Visit: Payer: Self-pay | Admitting: Family

## 2016-11-12 ENCOUNTER — Other Ambulatory Visit: Payer: Self-pay | Admitting: Internal Medicine

## 2016-11-24 ENCOUNTER — Other Ambulatory Visit: Payer: Self-pay | Admitting: Internal Medicine

## 2016-12-09 IMAGING — CT CT ANGIO CHEST
2 of 6 series · 19 of 36 positions shown · IV contrast (omnipaque)
Comparison: Radiograph dated 05/02/2015

CLINICAL DATA: 59-year-old female with shortness of breath and
chest pain

EXAM:
CT ANGIOGRAPHY CHEST WITH CONTRAST
TECHNIQUE: Multidetector CT imaging of the chest was performed using the
standard protocol during bolus administration of intravenous
contrast. Multiplanar CT image reconstructions and MIPs were
obtained to evaluate the vascular anatomy.
CONTRAST:  60mL OMNIPAQUE IOHEXOL 350 MG/ML SOLN

[Series 6: pe thins · axial · 0.70mm/px · z∈[+1231,+1496]mm · 18 of 585 slices shown]
[im 28/585  lung]
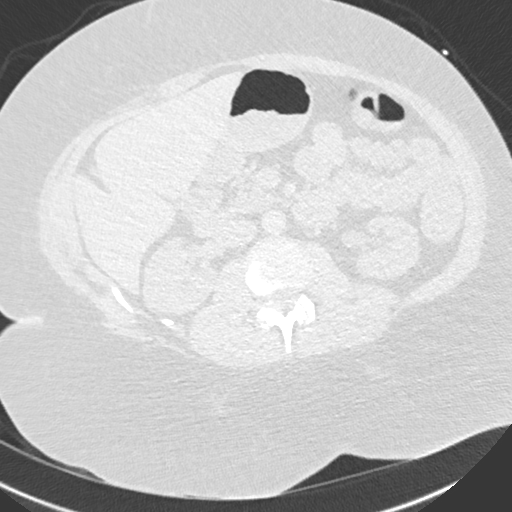
[im 56/585  mediastinal]
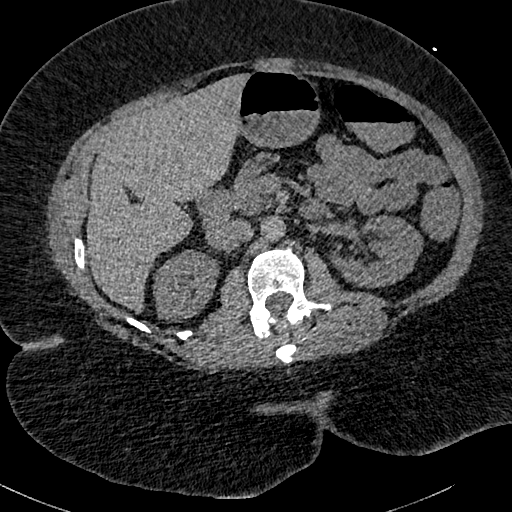
[im 84/585  lung]
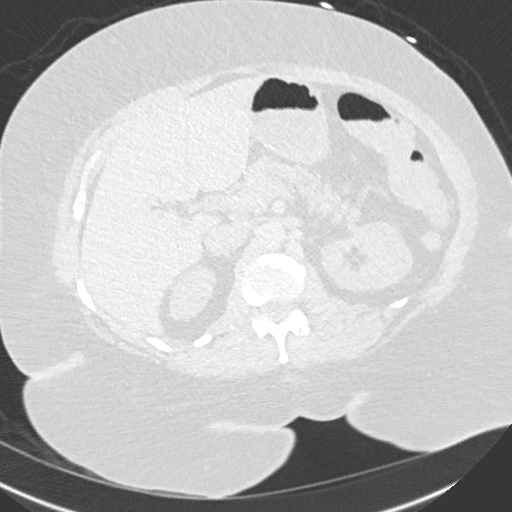
[im 112/585  mediastinal]
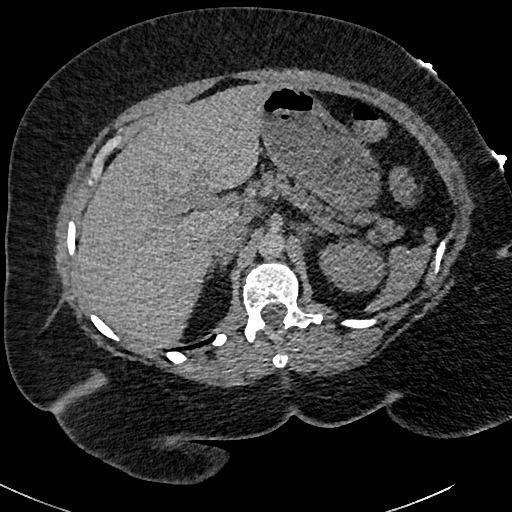
[im 167/585  lung]
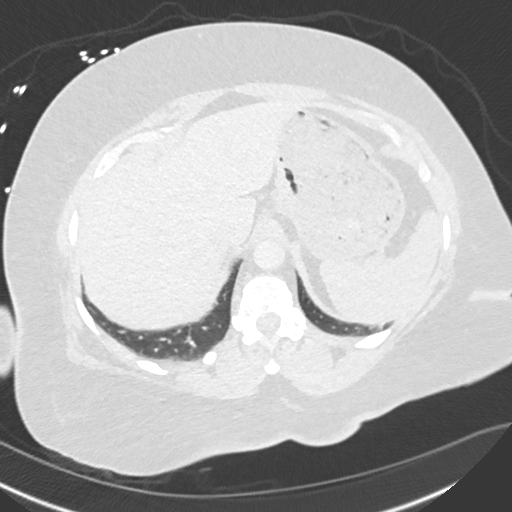
[im 195/585  mediastinal]
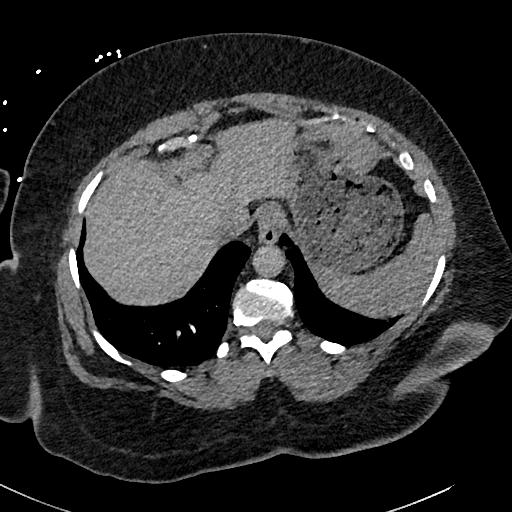
[im 223/585  lung]
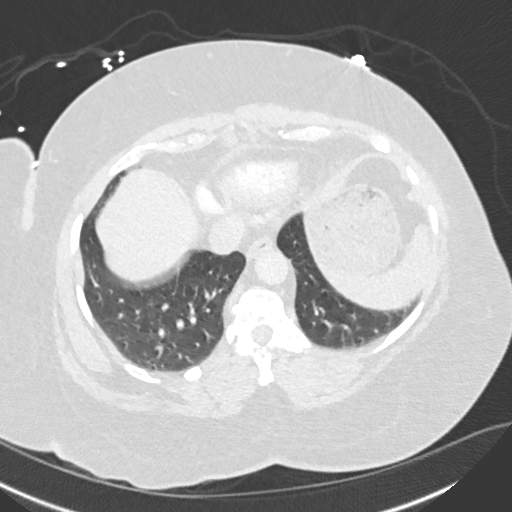
[im 251/585  mediastinal]
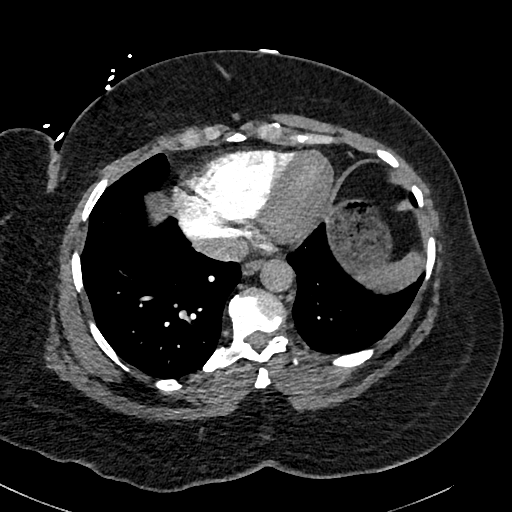
[im 279/585  lung]
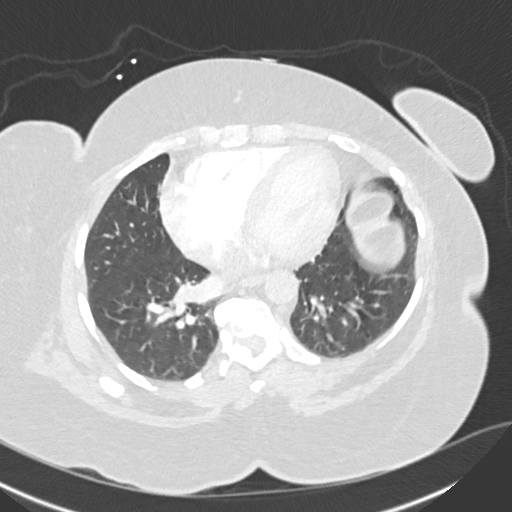
[im 306/585  mediastinal]
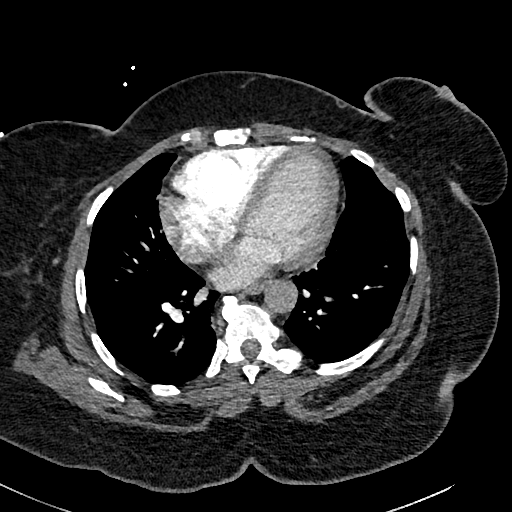
[im 334/585  lung]
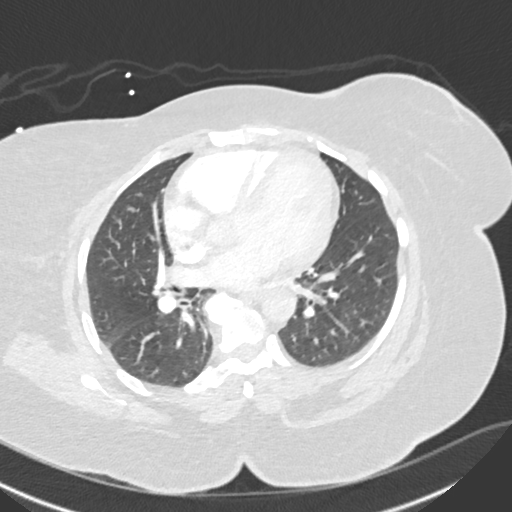
[im 362/585  mediastinal]
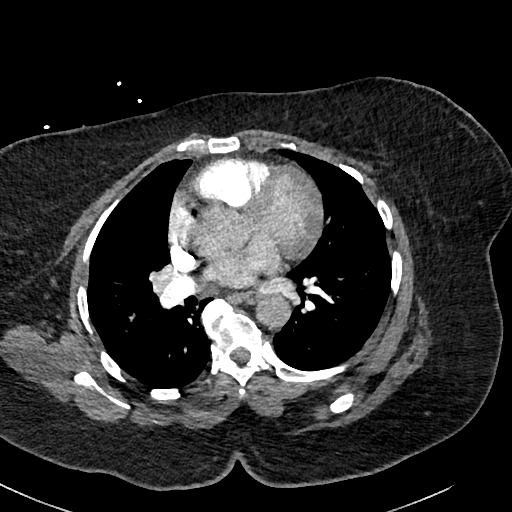
[im 390/585  lung]
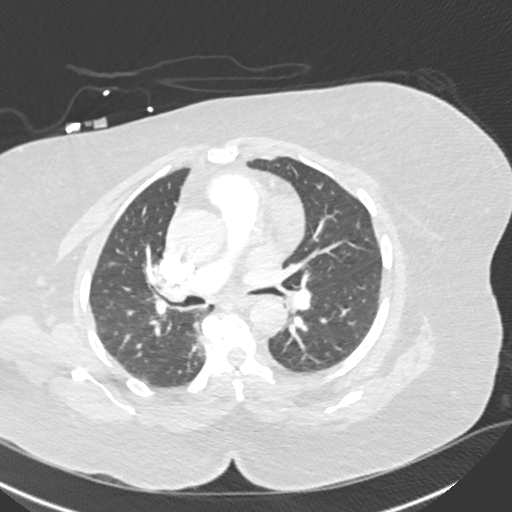
[im 445/585  mediastinal]
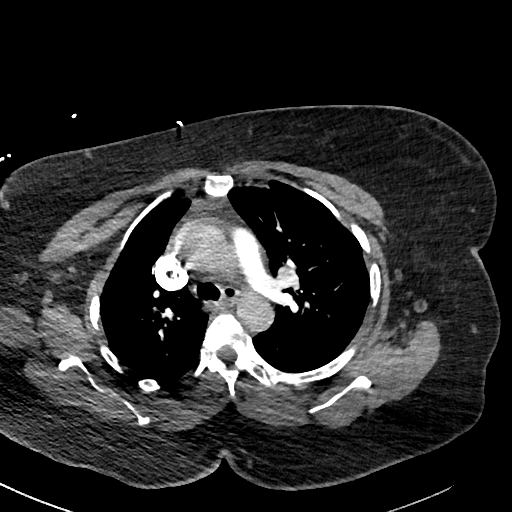
[im 473/585  lung]
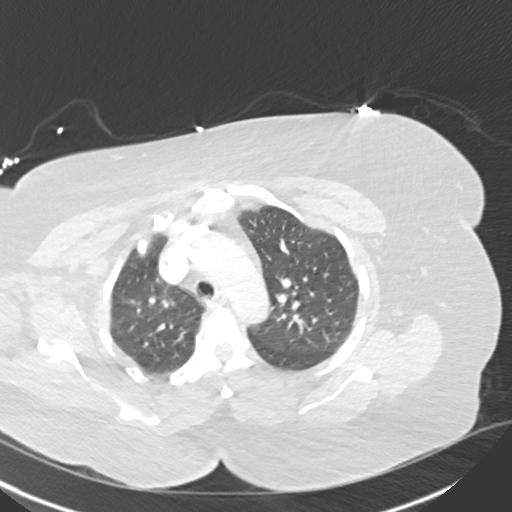
[im 501/585  mediastinal]
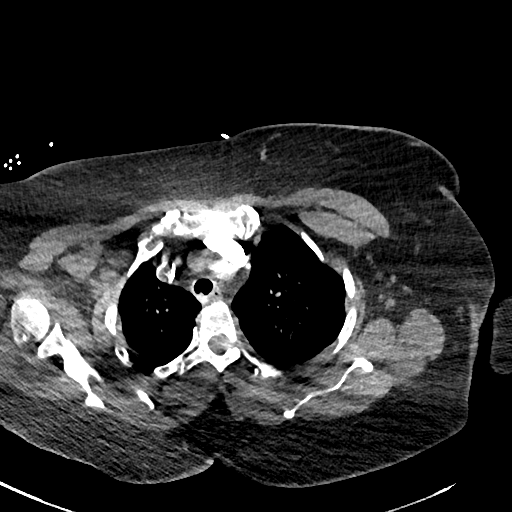
[im 529/585  lung]
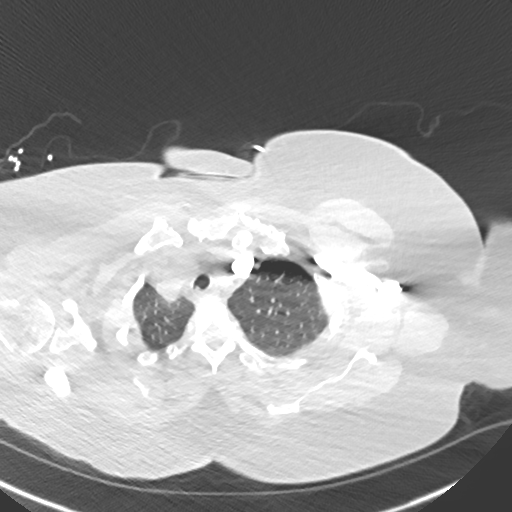
[im 557/585  mediastinal]
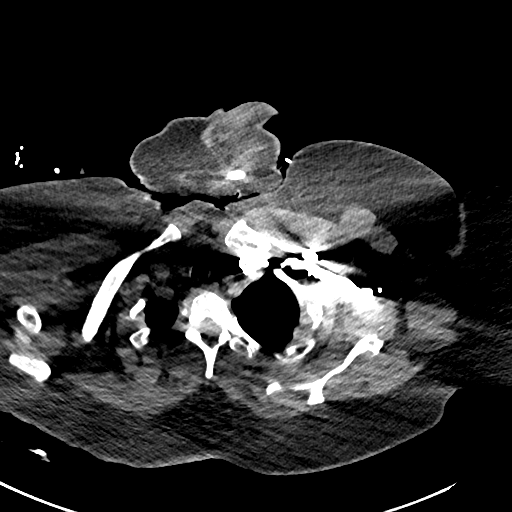

[Series 7: pe 2mm cor · coronal · 0.59mm/px · 1 of 150 slices shown]
[im 75/150  mediastinal]
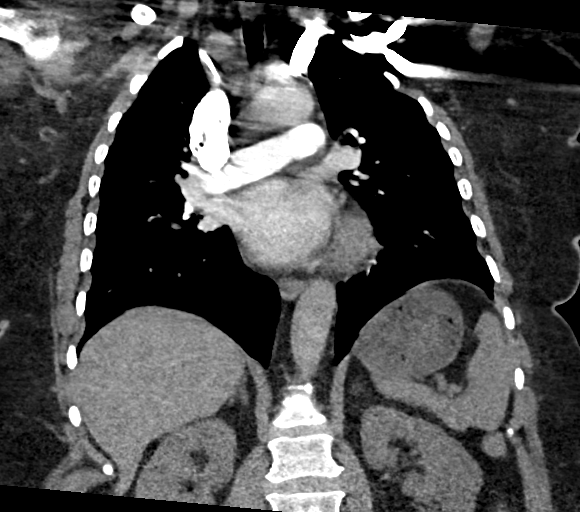

[19 of 36 positions shown; findings below may reference images not displayed]

FINDINGS: The lungs are clear. No pleural effusion or pneumothorax. The
central airways are patent.

The thoracic aorta appears unremarkable. no CT evidence of pulmonary
embolism. There is no cardiomegaly or pericardial effusion. No hilar
or mediastinal adenopathy. The esophagus is unremarkable. There is
no axillary adenopathy. The chest wall soft tissues appear
unremarkable. There is degenerative changes of the spine. No acute
fracture.

Gallstones.  The visualized upper abdomen is otherwise unremarkable.

Review of the MIP images confirms the above findings.
IMPRESSION: No CT evidence of pulmonary embolism.

## 2016-12-10 ENCOUNTER — Other Ambulatory Visit: Payer: Self-pay | Admitting: Family

## 2017-01-09 ENCOUNTER — Other Ambulatory Visit: Payer: Self-pay | Admitting: Family

## 2017-01-12 NOTE — Telephone Encounter (Signed)
Pt called requesting this refill. Please advise.

## 2017-01-13 NOTE — Telephone Encounter (Signed)
Patient called back in.  I notified.

## 2017-01-13 NOTE — Telephone Encounter (Signed)
Pt called back checking on this refill.  °

## 2017-01-19 ENCOUNTER — Other Ambulatory Visit: Payer: Self-pay | Admitting: Internal Medicine

## 2017-01-20 ENCOUNTER — Other Ambulatory Visit: Payer: Self-pay | Admitting: Internal Medicine

## 2017-02-13 ENCOUNTER — Other Ambulatory Visit: Payer: Self-pay | Admitting: *Deleted

## 2017-02-13 MED ORDER — FUROSEMIDE 40 MG PO TABS: 40.0000 mg | ORAL_TABLET | Freq: Every day | ORAL | 0 refills | Status: DC

## 2017-02-28 LAB — GLUCOSE, POCT (MANUAL RESULT ENTRY): POC GLUCOSE: 102 mg/dL — AB (ref 70–99)

## 2017-02-28 LAB — POCT LIPID PANEL
HDL: 105
TC: 193
TRG: 74

## 2017-04-15 ENCOUNTER — Other Ambulatory Visit: Payer: Self-pay | Admitting: Internal Medicine

## 2017-04-23 ENCOUNTER — Other Ambulatory Visit: Payer: Self-pay | Admitting: Internal Medicine

## 2017-05-05 ENCOUNTER — Encounter: Payer: Self-pay | Admitting: Internal Medicine

## 2017-05-05 ENCOUNTER — Ambulatory Visit (INDEPENDENT_AMBULATORY_CARE_PROVIDER_SITE_OTHER): Payer: BLUE CROSS/BLUE SHIELD | Admitting: Internal Medicine

## 2017-05-05 ENCOUNTER — Other Ambulatory Visit (INDEPENDENT_AMBULATORY_CARE_PROVIDER_SITE_OTHER): Payer: BLUE CROSS/BLUE SHIELD

## 2017-05-05 VITALS — BP 130/74 | HR 66 | Temp 97.8°F | Ht 63.0 in | Wt 301.0 lb

## 2017-05-05 DIAGNOSIS — Z0000 Encounter for general adult medical examination without abnormal findings: Secondary | ICD-10-CM

## 2017-05-05 DIAGNOSIS — I10 Essential (primary) hypertension: Secondary | ICD-10-CM | POA: Diagnosis not present

## 2017-05-05 DIAGNOSIS — J4520 Mild intermittent asthma, uncomplicated: Secondary | ICD-10-CM

## 2017-05-05 DIAGNOSIS — Z1159 Encounter for screening for other viral diseases: Secondary | ICD-10-CM

## 2017-05-05 DIAGNOSIS — D649 Anemia, unspecified: Secondary | ICD-10-CM

## 2017-05-05 LAB — LIPID PANEL
CHOL/HDL RATIO: 3
Cholesterol: 162 mg/dL (ref 0–200)
HDL: 63.1 mg/dL (ref >39.00)
LDL Cholesterol: 88 mg/dL (ref 0–99)
NONHDL: 98.53
Triglycerides: 52 mg/dL (ref 0.0–149.0)
VLDL: 10.4 mg/dL (ref 0.0–40.0)

## 2017-05-05 LAB — COMPREHENSIVE METABOLIC PANEL
ALT: 14 U/L (ref 0–35)
AST: 17 U/L (ref 0–37)
Albumin: 4 g/dL (ref 3.5–5.2)
Alkaline Phosphatase: 109 U/L (ref 39–117)
BILIRUBIN TOTAL: 0.4 mg/dL (ref 0.2–1.2)
BUN: 14 mg/dL (ref 6–23)
CO2: 32 mEq/L (ref 19–32)
Calcium: 9.4 mg/dL (ref 8.4–10.5)
Chloride: 103 mEq/L (ref 96–112)
Creatinine, Ser: 0.89 mg/dL (ref 0.40–1.20)
GFR: 83.17 mL/min (ref >60.00)
GLUCOSE: 95 mg/dL (ref 70–99)
Potassium: 4.2 mEq/L (ref 3.5–5.1)
SODIUM: 141 meq/L (ref 135–145)
Total Protein: 7.6 g/dL (ref 6.0–8.3)

## 2017-05-05 LAB — CBC
HEMATOCRIT: 43.1 % (ref 36.0–46.0)
Hemoglobin: 13.9 g/dL (ref 12.0–15.0)
MCHC: 32.1 g/dL (ref 30.0–36.0)
MCV: 82.2 fl (ref 78.0–100.0)
PLATELETS: 229 10*3/uL (ref 150.0–400.0)
RBC: 5.25 Mil/uL — ABNORMAL HIGH (ref 3.87–5.11)
RDW: 17.7 % — AB (ref 11.5–15.5)
WBC: 7 10*3/uL (ref 4.0–10.5)

## 2017-05-05 LAB — VITAMIN D 25 HYDROXY (VIT D DEFICIENCY, FRACTURES): VITD: 21.08 ng/mL — ABNORMAL LOW (ref 30.00–100.00)

## 2017-05-05 LAB — HEMOGLOBIN A1C: Hgb A1c MFr Bld: 6.1 % (ref 4.6–6.5)

## 2017-05-05 MED ORDER — LORCASERIN HCL ER 20 MG PO TB24: 20.0000 mg | ORAL_TABLET | Freq: Every day | ORAL | 3 refills | Status: DC

## 2017-05-05 NOTE — Assessment & Plan Note (Signed)
Taking lasix daily and BP normal. Checking CMP and adjust as needed.

## 2017-05-05 NOTE — Assessment & Plan Note (Signed)
Colonoscopy and mammogram up to date. Reminded about pap smear with gyn. Checking hep c screening today. Counseled about diet and exercise. Tetanus up to date. Flu shot up to date. Counseled about shingrix vaccine. Given screening recommendations.

## 2017-05-05 NOTE — Progress Notes (Signed)
   Subjective:    Patient ID: Linda Cabrera, female    DOB: Apr 27, 1955, 63 y.o.   MRN: [id number]  HPI The patient is a 62 YO female coming in for physical.   PMH, [facility], social history reviewed and updated.   Review of Systems  Constitutional: Negative.   HENT: Negative.   Eyes: Negative.   Respiratory: Negative for cough, chest tightness and shortness of breath.   Cardiovascular: Negative for chest pain, palpitations and leg swelling.  Gastrointestinal: Negative for abdominal distention, abdominal pain, constipation, diarrhea, nausea and vomiting.  Musculoskeletal: Negative.   Skin: Negative.   Neurological: Negative.   Psychiatric/Behavioral: Negative.       Objective:   Physical Exam  Constitutional: She is oriented to person, place, and time. She appears well-developed and well-nourished.  Overweight  HENT:  Head: Normocephalic and atraumatic.  Eyes: EOM are normal.  Neck: Normal range of motion.  Cardiovascular: Normal rate and regular rhythm.  Pulmonary/Chest: Effort normal and breath sounds normal. No respiratory distress. She has no wheezes. She has no rales.  Abdominal: Soft. Bowel sounds are normal. She exhibits no distension. There is no tenderness. There is no rebound.  Musculoskeletal: She exhibits no edema.  Neurological: She is alert and oriented to person, place, and time. Coordination normal.  Skin: Skin is warm and dry.  Psychiatric: She has a normal mood and affect.   Vitals:   05/05/17 0810  BP: 130/74  Pulse: 66  Temp: 97.8 F (36.6 C)  TempSrc: Oral  SpO2: 99%  Weight: (!) 301 lb (136.5 kg)  Height: 5\' 3"  (1.6 m)      Assessment & Plan:

## 2017-05-05 NOTE — Assessment & Plan Note (Signed)
Refill singulair and albuterol. She is currently out of both and having some SOB. No wheezing on exam to suggest need for treatment other than resuming normal treatment.

## 2017-05-05 NOTE — Assessment & Plan Note (Signed)
Rx for belviq and weight is increasing. She cannot tolerate phentermine as she got chest pains previously with this medication. Talked to her about weight loss reduction procedures today.

## 2017-05-05 NOTE — Assessment & Plan Note (Signed)
Checking CBC for monitoring. Colonoscopy up to date.

## 2017-05-05 NOTE — Patient Instructions (Signed)
We have sent in belviq to the pharmacy to help lose weight. Take 1 pill daily and come back in about 3 months to check on how you are doing.   Health Maintenance, Female Adopting a healthy lifestyle and getting preventive care can go a long way to promote health and wellness. Talk with your health care provider about what schedule of regular examinations is right for you. This is a good chance for you to check in with your provider about disease prevention and staying healthy. In between checkups, there are plenty of things you can do on your own. Experts have done a lot of research about which lifestyle changes and preventive measures are most likely to keep you healthy. Ask your health care provider for more information. Weight and diet Eat a healthy diet  Be sure to include plenty of vegetables, fruits, low-fat dairy products, and lean protein.  Do not eat a lot of foods high in solid fats, added sugars, or salt.  Get regular exercise. This is one of the most important things you can do for your health. ? Most adults should exercise for at least 150 minutes each week. The exercise should increase your heart rate and make you sweat (moderate-intensity exercise). ? Most adults should also do strengthening exercises at least twice a week. This is in addition to the moderate-intensity exercise.  Maintain a healthy weight  Body mass index (BMI) is a measurement that can be used to identify possible weight problems. It estimates body fat based on height and weight. Your health care provider can help determine your BMI and help you achieve or maintain a healthy weight.  For females 70 years of age and older: ? A BMI below 18.5 is considered underweight. ? A BMI of 18.5 to 24.9 is normal. ? A BMI of 25 to 29.9 is considered overweight. ? A BMI of 30 and above is considered obese.  Watch levels of cholesterol and blood lipids  You should start having your blood tested for lipids and cholesterol  at 61 years of age, then have this test every 5 years.  You may need to have your cholesterol levels checked more often if: ? Your lipid or cholesterol levels are high. ? You are older than 61 years of age. ? You are at high risk for heart disease.  Cancer screening Lung Cancer  Lung cancer screening is recommended for adults 47-71 years old who are at high risk for lung cancer because of a history of smoking.  A yearly low-dose CT scan of the lungs is recommended for people who: ? Currently smoke. ? Have quit within the past 15 years. ? Have at least a 30-pack-year history of smoking. A pack year is smoking an average of one pack of cigarettes a day for 1 year.  Yearly screening should continue until it has been 15 years since you quit.  Yearly screening should stop if you develop a health problem that would prevent you from having lung cancer treatment.  Breast Cancer  Practice breast self-awareness. This means understanding how your breasts normally appear and feel.  It also means doing regular breast self-exams. Let your health care provider know about any changes, no matter how small.  If you are in your 20s or 30s, you should have a clinical breast exam (CBE) by a health care provider every 1-3 years as part of a regular health exam.  If you are 107 or older, have a CBE every year. Also consider having  a breast X-ray (mammogram) every year.  If you have a family history of breast cancer, talk to your health care provider about genetic screening.  If you are at high risk for breast cancer, talk to your health care provider about having an MRI and a mammogram every year.  Breast cancer gene (BRCA) assessment is recommended for women who have family members with BRCA-related cancers. BRCA-related cancers include: ? Breast. ? Ovarian. ? Tubal. ? Peritoneal cancers.  Results of the assessment will determine the need for genetic counseling and BRCA1 and BRCA2  testing.  Cervical Cancer Your health care provider may recommend that you be screened regularly for cancer of the pelvic organs (ovaries, uterus, and vagina). This screening involves a pelvic examination, including checking for microscopic changes to the surface of your cervix (Pap test). You may be encouraged to have this screening done every 3 years, beginning at age 64.  For women ages 77-65, health care providers may recommend pelvic exams and Pap testing every 3 years, or they may recommend the Pap and pelvic exam, combined with testing for human papilloma virus (HPV), every 5 years. Some types of HPV increase your risk of cervical cancer. Testing for HPV may also be done on women of any age with unclear Pap test results.  Other health care providers may not recommend any screening for nonpregnant women who are considered low risk for pelvic cancer and who do not have symptoms. Ask your health care provider if a screening pelvic exam is right for you.  If you have had past treatment for cervical cancer or a condition that could lead to cancer, you need Pap tests and screening for cancer for at least 20 years after your treatment. If Pap tests have been discontinued, your risk factors (such as having a new sexual partner) need to be reassessed to determine if screening should resume. Some women have medical problems that increase the chance of getting cervical cancer. In these cases, your health care provider may recommend more frequent screening and Pap tests.  Colorectal Cancer  This type of cancer can be detected and often prevented.  Routine colorectal cancer screening usually begins at 61 years of age and continues through 61 years of age.  Your health care provider may recommend screening at an earlier age if you have risk factors for colon cancer.  Your health care provider may also recommend using home test kits to check for hidden blood in the stool.  A small camera at the end of a  tube can be used to examine your colon directly (sigmoidoscopy or colonoscopy). This is done to check for the earliest forms of colorectal cancer.  Routine screening usually begins at age 75.  Direct examination of the colon should be repeated every 5-10 years through 61 years of age. However, you may need to be screened more often if early forms of precancerous polyps or small growths are found.  Skin Cancer  Check your skin from head to toe regularly.  Tell your health care provider about any new moles or changes in moles, especially if there is a change in a mole's shape or color.  Also tell your health care provider if you have a mole that is larger than the size of a pencil eraser.  Always use sunscreen. Apply sunscreen liberally and repeatedly throughout the day.  Protect yourself by wearing long sleeves, pants, a wide-brimmed hat, and sunglasses whenever you are outside.  Heart disease, diabetes, and high blood pressure  High blood pressure causes heart disease and increases the risk of stroke. High blood pressure is more likely to develop in: ? People who have blood pressure in the high end of the normal range (130-139/85-89 mm Hg). ? People who are overweight or obese. ? People who are African American.  If you are 97-43 years of age, have your blood pressure checked every 3-5 years. If you are 34 years of age or older, have your blood pressure checked every year. You should have your blood pressure measured twice-once when you are at a hospital or clinic, and once when you are not at a hospital or clinic. Record the average of the two measurements. To check your blood pressure when you are not at a hospital or clinic, you can use: ? An automated blood pressure machine at a pharmacy. ? A home blood pressure monitor.  If you are between 39 years and 85 years old, ask your health care provider if you should take aspirin to prevent strokes.  Have regular diabetes screenings. This  involves taking a blood sample to check your fasting blood sugar level. ? If you are at a normal weight and have a low risk for diabetes, have this test once every three years after 61 years of age. ? If you are overweight and have a high risk for diabetes, consider being tested at a younger age or more often. Preventing infection Hepatitis B  If you have a higher risk for hepatitis B, you should be screened for this virus. You are considered at high risk for hepatitis B if: ? You were born in a country where hepatitis B is common. Ask your health care provider which countries are considered high risk. ? Your parents were born in a high-risk country, and you have not been immunized against hepatitis B (hepatitis B vaccine). ? You have HIV or AIDS. ? You use needles to inject street drugs. ? You live with someone who has hepatitis B. ? You have had sex with someone who has hepatitis B. ? You get hemodialysis treatment. ? You take certain medicines for conditions, including cancer, organ transplantation, and autoimmune conditions.  Hepatitis C  Blood testing is recommended for: ? Everyone born from [birth year] through 1964. ? Anyone with known risk factors for hepatitis C.  Sexually transmitted infections (STIs)  You should be screened for sexually transmitted infections (STIs) including gonorrhea and chlamydia if: ? You are sexually active and are younger than 61 years of age. ? You are older than 61 years of age and your health care provider tells you that you are at risk for this type of infection. ? Your sexual activity has changed since you were last screened and you are at an increased risk for chlamydia or gonorrhea. Ask your health care provider if you are at risk.  If you do not have HIV, but are at risk, it may be recommended that you take a prescription medicine daily to prevent HIV infection. This is called pre-exposure prophylaxis (PrEP). You are considered at risk if: ? You are  sexually active and do not regularly use condoms or know the HIV status of your partner(s). ? You take drugs by injection. ? You are sexually active with a partner who has HIV.  Talk with your health care provider about whether you are at high risk of being infected with HIV. If you choose to begin PrEP, you should first be tested for HIV. You should then be tested every 3 months  for as long as you are taking PrEP. Pregnancy  If you are premenopausal and you may become pregnant, ask your health care provider about preconception counseling.  If you may become pregnant, take 400 to 800 micrograms (mcg) of folic acid every day.  If you want to prevent pregnancy, talk to your health care provider about birth control (contraception). Osteoporosis and menopause  Osteoporosis is a disease in which the bones lose minerals and strength with aging. This can result in serious bone fractures. Your risk for osteoporosis can be identified using a bone density scan.  If you are 29 years of age or older, or if you are at risk for osteoporosis and fractures, ask your health care provider if you should be screened.  Ask your health care provider whether you should take a calcium or vitamin D supplement to lower your risk for osteoporosis.  Menopause may have certain physical symptoms and risks.  Hormone replacement therapy may reduce some of these symptoms and risks. Talk to your health care provider about whether hormone replacement therapy is right for you. Follow these instructions at home:  Schedule regular health, dental, and eye exams.  Stay current with your immunizations.  Do not use any tobacco products including cigarettes, chewing tobacco, or electronic cigarettes.  If you are pregnant, do not drink alcohol.  If you are breastfeeding, limit how much and how often you drink alcohol.  Limit alcohol intake to no more than 1 drink per day for nonpregnant women. One drink equals 12 ounces of  beer, 5 ounces of wine, or 1 ounces of hard liquor.  Do not use street drugs.  Do not share needles.  Ask your health care provider for help if you need support or information about quitting drugs.  Tell your health care provider if you often feel depressed.  Tell your health care provider if you have ever been abused or do not feel safe at home. This information is not intended to replace advice given to you by your health care provider. Make sure you discuss any questions you have with your health care provider. Document Released: 10/14/2010 Document Revised: 09/06/2015 Document Reviewed: 01/02/2015 Elsevier Interactive Patient Education  [employer].

## 2017-05-06 ENCOUNTER — Telehealth: Payer: Self-pay | Admitting: Internal Medicine

## 2017-05-06 LAB — HEPATITIS C ANTIBODY
Hepatitis C Ab: NONREACTIVE
SIGNAL TO CUT-OFF: 0.03 (ref <1.00)

## 2017-05-06 NOTE — Telephone Encounter (Signed)
PA started on CoverMyMeds KEY: [id number]

## 2017-05-06 NOTE — Telephone Encounter (Signed)
Contacted patient will start PA today

## 2017-05-06 NOTE — Telephone Encounter (Signed)
CRM for notification. See Telephone encounter for:   05/06/17.   Relation to pt: self  Call back number: [phone] Pharmacy: [facility], [place] [phone] (Phone) [phone] (Fax)    Reason for call:  Pharmacy informed patient Linda HCl ER (BELVIQ XR) 20 MG TB24 is need of PA and form will be fax to PCP, patient checking on the status if PA has been initiated, please advise

## 2017-05-08 NOTE — Telephone Encounter (Signed)
PA approved 05/06/2017-11/01/2017

## 2017-05-11 ENCOUNTER — Other Ambulatory Visit: Payer: Self-pay | Admitting: Internal Medicine

## 2017-05-18 ENCOUNTER — Other Ambulatory Visit: Payer: Self-pay | Admitting: Internal Medicine

## 2017-05-19 ENCOUNTER — Other Ambulatory Visit: Payer: Self-pay

## 2017-05-19 MED ORDER — BENZONATATE 200 MG PO CAPS
200.0000 mg | ORAL_CAPSULE | Freq: Three times a day (TID) | ORAL | 1 refills | Status: DC | PRN
Start: 2017-05-19 — End: 2018-01-21

## 2017-05-19 NOTE — Addendum Note (Signed)
Addended by: Pricilla Holm A on: 05/19/2017 04:40 PM   Modules accepted: Orders

## 2017-05-19 NOTE — Telephone Encounter (Signed)
Patient states that she is having just a cough

## 2017-05-19 NOTE — Telephone Encounter (Signed)
Sent in

## 2017-05-19 NOTE — Telephone Encounter (Signed)
Patient checking on status of refill for benzoate b/c her asthma is acting up. If it has been refused please contact patient to let her know.

## 2017-05-19 NOTE — Telephone Encounter (Signed)
Patient was wanting a refill on benzonatate, I denied it because I did not see it on current med list. Patient states her asthma is acting up and was wanting to get it. Is this appropriate?

## 2017-05-19 NOTE — Telephone Encounter (Signed)
This can not help her asthma but is only symptom relief for cough. If she is having SOB or wheezing needs visit to help her asthma. If cough is her only symptom then we can send in tessalon perles for her just let me know.

## 2017-05-21 ENCOUNTER — Other Ambulatory Visit: Payer: Self-pay | Admitting: Internal Medicine

## 2017-05-27 ENCOUNTER — Ambulatory Visit
Admission: RE | Admit: 2017-05-27 | Discharge: 2017-05-27 | Disposition: A | Discharge status: Home / Self Care | Payer: BLUE CROSS/BLUE SHIELD | Source: Ambulatory Visit | Attending: Internal Medicine | Admitting: Internal Medicine

## 2017-05-27 DIAGNOSIS — Z1231 Encounter for screening mammogram for malignant neoplasm of breast: Secondary | ICD-10-CM

## 2017-06-01 ENCOUNTER — Other Ambulatory Visit: Payer: Self-pay | Admitting: Internal Medicine

## 2017-06-02 ENCOUNTER — Other Ambulatory Visit: Payer: Self-pay | Admitting: Internal Medicine

## 2017-06-02 NOTE — Telephone Encounter (Signed)
Routing to dr crawford, please advise, thanks 

## 2017-06-17 ENCOUNTER — Other Ambulatory Visit: Payer: Self-pay | Admitting: Internal Medicine

## 2017-06-25 ENCOUNTER — Other Ambulatory Visit: Payer: Self-pay | Admitting: Internal Medicine

## 2017-07-17 ENCOUNTER — Other Ambulatory Visit: Payer: Self-pay | Admitting: Internal Medicine

## 2017-08-10 ENCOUNTER — Encounter: Payer: Self-pay | Admitting: Internal Medicine

## 2017-08-10 ENCOUNTER — Ambulatory Visit: Payer: BLUE CROSS/BLUE SHIELD | Admitting: Internal Medicine

## 2017-08-10 MED ORDER — LORCASERIN HCL ER 20 MG PO TB24: 20.0000 mg | ORAL_TABLET | Freq: Every day | ORAL | 1 refills | Status: DC

## 2017-08-10 NOTE — Patient Instructions (Addendum)
We will see you back in 3-6 months to check in on the weight.

## 2017-08-10 NOTE — Assessment & Plan Note (Signed)
BMI 51 today and weight down about 11 pounds since our last visit with starting belviq. She would like to continue so this is refilled today. Continue with follow up 3-6 months for progress.

## 2017-08-10 NOTE — Progress Notes (Signed)
   Subjective:    Patient ID: Linda Cabrera, female    DOB: Jul 06, 1955, 62 y.o.   MRN: [id number]  HPI The patient is a 62 YO female coming in for follow up of starting belviq. She has noticed some decrease in cravings. She has lost about 12 pounds since last visit. Paying about $80 per month for belviq and is satisfied with it. Denies side effects. No new chest pains or SOB. She has also been working on portions and diet.   Review of Systems  Constitutional: Negative.   HENT: Negative.   Eyes: Negative.   Respiratory: Negative for cough, chest tightness and shortness of breath.   Cardiovascular: Negative for chest pain, palpitations and leg swelling.  Gastrointestinal: Negative for abdominal distention, abdominal pain, constipation, diarrhea, nausea and vomiting.  Musculoskeletal: Negative.   Skin: Negative.   Neurological: Negative.   Psychiatric/Behavioral: Negative.       Objective:   Physical Exam  Constitutional: She is oriented to person, place, and time. She appears well-developed and well-nourished.  HENT:  Head: Normocephalic and atraumatic.  Eyes: EOM are normal.  Neck: Normal range of motion.  Cardiovascular: Normal rate and regular rhythm.  Pulmonary/Chest: Effort normal and breath sounds normal. No respiratory distress. She has no wheezes. She has no rales.  Abdominal: Soft.  Musculoskeletal: She exhibits no edema.  Neurological: She is alert and oriented to person, place, and time. Coordination normal.  Skin: Skin is warm and dry.   Vitals:   08/10/17 0951  BP: 130/80  Pulse: 65  Temp: 98.2 F (36.8 C)  TempSrc: Oral  SpO2: 95%  Weight: 289 lb (131.1 kg)  Height: 5\' 3"  (1.6 m)      Assessment & Plan:

## 2017-08-14 ENCOUNTER — Other Ambulatory Visit: Payer: Self-pay | Admitting: Internal Medicine

## 2017-09-11 ENCOUNTER — Other Ambulatory Visit: Payer: Self-pay | Admitting: Internal Medicine

## 2017-09-14 ENCOUNTER — Other Ambulatory Visit: Payer: Self-pay | Admitting: Internal Medicine

## 2017-09-14 NOTE — Telephone Encounter (Signed)
Copied from [place] [id number]. Topic: Quick Communication - Rx Refill/Question >> Sep 14, 2017 12:32 PM Carolyn Stare wrote: Medication   acyclovir (ZOVIRAX) 200 MG capsule  Has the patient contacted their pharmacy yes    Preferred Pharmacy    [facility]   Agent: Please be advised that RX refills may take up to 3 business days. We ask that you follow-up with your pharmacy.

## 2017-09-14 NOTE — Telephone Encounter (Signed)
Pt checking status on her med refill.  

## 2017-09-14 NOTE — Telephone Encounter (Signed)
Please advise regarding Lasix.  Thanks

## 2017-09-15 NOTE — Telephone Encounter (Signed)
Rx refill request: acyclovir 200 mg             Last filled: 06/17/17 #90  LOV: 05/05/17  PCP: [place]: [place]

## 2017-09-16 MED ORDER — ACYCLOVIR 200 MG PO CAPS: ORAL_CAPSULE | ORAL | 1 refills | Status: DC

## 2017-10-23 ENCOUNTER — Telehealth: Payer: Self-pay | Admitting: Internal Medicine

## 2017-10-23 NOTE — Telephone Encounter (Signed)
Copied from [place] [phone]. Topic: General - Other >> Oct 23, 2017 10:30 AM Margot Ables wrote: Reason for CRM: Calling needing more recent weight for the patient for PA on Belviq. Date submitted was 08/10/17. Please advise. Ref # [id number]

## 2017-10-23 NOTE — Telephone Encounter (Signed)
LVM for patient to call back and let me know what her current weight is so that I can give that information to insurance for the PA of the Belviq

## 2017-10-26 NOTE — Telephone Encounter (Signed)
Faxed the PA back with updated information

## 2017-10-26 NOTE — Telephone Encounter (Signed)
Pt called to state her weight is 280lbs

## 2017-10-27 ENCOUNTER — Telehealth: Payer: Self-pay | Admitting: Internal Medicine

## 2017-10-27 NOTE — Telephone Encounter (Signed)
Should take for flares up to 5 days per flare. If she wants preventative medicine for cold sores/herpes this would be a different medication altogether with simpler dosing.

## 2017-10-27 NOTE — Telephone Encounter (Signed)
Copied from [place] [phone]. Topic: Quick Communication - Rx Refill/Question >> Oct 27, 2017  1:37 PM Burchel, Abbi R wrote: Medication: acyclovir (ZOVIRAX) 200 MG capsule  Mickel Baas ([facility] called to get clrification on this rx.  Rx states 2 capsules 3xday for 5 days, but Pt states that she was instructed to take this med 2 capsules 3x daily indefinitely.  Pharmacy needs clarification on how to dispense this medication.   Preferred Pharmacy: [facility], [place]  [phone] (Phone) [phone] (Fax)  Pt: [phone]

## 2017-10-28 ENCOUNTER — Telehealth: Payer: Self-pay

## 2017-10-28 ENCOUNTER — Telehealth: Payer: Self-pay | Admitting: Internal Medicine

## 2017-10-28 MED ORDER — VALACYCLOVIR HCL 1 G PO TABS: 1000.0000 mg | ORAL_TABLET | Freq: Every day | ORAL | 1 refills | Status: DC

## 2017-10-28 NOTE — Telephone Encounter (Signed)
Is this an error?

## 2017-10-28 NOTE — Telephone Encounter (Signed)
See CRM.  

## 2017-10-28 NOTE — Telephone Encounter (Signed)
Copied from [place]. Topic: Quick Communication - See Telephone Encounter >> Oct 28, 2017 10:36 AM Neva Seat wrote: Pt needing to discuss a preventative medication for herpes.   Please call pt back to discuss asap.

## 2017-10-28 NOTE — Telephone Encounter (Signed)
Patient informed. 

## 2017-10-28 NOTE — Addendum Note (Signed)
Addended by: Pricilla Holm A on: 10/28/2017 11:29 AM   Modules accepted: Orders

## 2017-10-28 NOTE — Telephone Encounter (Signed)
Pa approved through till 10/21/2017-10/21/2018

## 2017-10-28 NOTE — Telephone Encounter (Signed)
Called patient and states that she has herpes breakouts often and was told that she could be on a preventative medication to help. Patient is wanting to be on a medication to help prevent the breakouts. States she does take her acyclovir daily but is still having frequent breakouts. Please advise. Thank you

## 2017-10-28 NOTE — Telephone Encounter (Signed)
Notified Laura w/ MD response../lmb ?

## 2017-10-28 NOTE — Telephone Encounter (Signed)
Sent in valtrex 1 pill daily to prevent outbreaks. Take 1 pill twice a day for 5-7 days during outbreak.

## 2018-01-21 ENCOUNTER — Other Ambulatory Visit: Payer: Self-pay | Admitting: Internal Medicine

## 2018-02-10 ENCOUNTER — Ambulatory Visit (INDEPENDENT_AMBULATORY_CARE_PROVIDER_SITE_OTHER): Payer: BLUE CROSS/BLUE SHIELD | Admitting: Internal Medicine

## 2018-02-10 ENCOUNTER — Other Ambulatory Visit (INDEPENDENT_AMBULATORY_CARE_PROVIDER_SITE_OTHER): Payer: BLUE CROSS/BLUE SHIELD

## 2018-02-10 ENCOUNTER — Encounter: Payer: Self-pay | Admitting: Internal Medicine

## 2018-02-10 ENCOUNTER — Telehealth: Payer: Self-pay | Admitting: Internal Medicine

## 2018-02-10 VITALS — BP 130/80 | HR 66 | Temp 98.2°F | Ht 63.0 in | Wt 284.0 lb

## 2018-02-10 DIAGNOSIS — R7303 Prediabetes: Secondary | ICD-10-CM

## 2018-02-10 DIAGNOSIS — G44201 Tension-type headache, unspecified, intractable: Secondary | ICD-10-CM

## 2018-02-10 LAB — COMPREHENSIVE METABOLIC PANEL
ALK PHOS: 86 U/L (ref 39–117)
ALT: 12 U/L (ref 0–35)
AST: 13 U/L (ref 0–37)
Albumin: 4 g/dL (ref 3.5–5.2)
BUN: 15 mg/dL (ref 6–23)
CHLORIDE: 104 meq/L (ref 96–112)
CO2: 30 mEq/L (ref 19–32)
CREATININE: 0.87 mg/dL (ref 0.40–1.20)
Calcium: 9.5 mg/dL (ref 8.4–10.5)
GFR: 85.16 mL/min (ref >60.00)
Glucose, Bld: 98 mg/dL (ref 70–99)
Potassium: 4.8 mEq/L (ref 3.5–5.1)
Sodium: 141 mEq/L (ref 135–145)
TOTAL PROTEIN: 7.6 g/dL (ref 6.0–8.3)
Total Bilirubin: 0.3 mg/dL (ref 0.2–1.2)

## 2018-02-10 LAB — CBC
HEMATOCRIT: 42.1 % (ref 36.0–46.0)
Hemoglobin: 13.6 g/dL (ref 12.0–15.0)
MCHC: 32.4 g/dL (ref 30.0–36.0)
MCV: 83.1 fl (ref 78.0–100.0)
Platelets: 223 10*3/uL (ref 150.0–400.0)
RBC: 5.07 Mil/uL (ref 3.87–5.11)
RDW: 18.4 % — AB (ref 11.5–15.5)
WBC: 7.3 10*3/uL (ref 4.0–10.5)

## 2018-02-10 LAB — HEMOGLOBIN A1C: Hgb A1c MFr Bld: 5.9 % (ref 4.6–6.5)

## 2018-02-10 MED ORDER — PHENTERMINE-TOPIRAMATE ER 3.75-23 MG PO CP24: ORAL_CAPSULE | ORAL | 0 refills | Status: DC

## 2018-02-10 NOTE — Telephone Encounter (Signed)
Copied from [place] [phone]. Topic: Quick Communication - Rx Refill/Question >> Feb 10, 2018 11:56 AM Reyne Dumas L wrote: Medication:  Pt states that she checked with her insurance and the diet pill they will cover is Qsymia.  Preferred Pharmacy (with phone number or street name): [id number] [address], [place]. [phone] (Phone) [phone] (Fax)

## 2018-02-10 NOTE — Telephone Encounter (Signed)
One month supply sent

## 2018-02-10 NOTE — Assessment & Plan Note (Signed)
Previous HgA1c 6.1. She has had some weight loss recently which could be related to belviq or related to some sugar diuresis causing weight loss. Will check HgA1c today and adjust as needed.

## 2018-02-10 NOTE — Telephone Encounter (Signed)
Patient was seen today and to call back and let us know what medication was covered by insurance. Can you send medication in for patient per Dr. Nathanial Millman absence? Thank you

## 2018-02-10 NOTE — Progress Notes (Signed)
   Subjective:    Patient ID: Linda Cabrera, female    DOB: Sep 05, 1955, 62 y.o.   MRN: [id number]  HPI The patient is a 62 YO female coming in for concerns about headaches (feeling more headaches in the last week, she denies change in diet or caffeine, she denies vision changes, auditory changes, no sensitivity, pain is diffuse, sleeping well) blood sugar (has had pre-diabetes in the past, most recent HgA1c 6.1, denies excessive urination but takes fluid pill and is not sure if it is different, sugar 167 fasting this weekend and has been similar over the last week or two, she does have more thirst lately and drinking more water) and weight (she has been prescribed belviq to help lower weight, she was able to get this, has noticed initial decrease in appetite with some weight loss, but feels like it has stopped working lately, overall she is down 20 pounds since January).   Review of Systems  Constitutional: Positive for appetite change. Negative for activity change, chills and unexpected weight change.       More appetite lately  HENT: Negative.   Eyes: Negative.   Respiratory: Negative for cough, chest tightness and shortness of breath.   Cardiovascular: Negative for chest pain, palpitations and leg swelling.  Gastrointestinal: Negative for abdominal distention, abdominal pain, constipation, diarrhea, nausea and vomiting.  Musculoskeletal: Negative.   Skin: Negative.   Neurological: Positive for headaches.  Psychiatric/Behavioral: Negative.       Objective:   Physical Exam  Constitutional: She is oriented to person, place, and time. She appears well-developed and well-nourished.  Overweight  HENT:  Head: Normocephalic and atraumatic.  No temporal tenderness  Eyes: EOM are normal.  Neck: Normal range of motion.  Cardiovascular: Normal rate and regular rhythm.  Pulmonary/Chest: Effort normal and breath sounds normal. No respiratory distress. She has no wheezes. She has no rales.    Abdominal: Soft. Bowel sounds are normal. She exhibits no distension. There is no tenderness. There is no rebound.  Musculoskeletal: She exhibits no edema.  Neurological: She is alert and oriented to person, place, and time. Coordination normal.  Skin: Skin is warm and dry.  Psychiatric: She has a normal mood and affect.   Vitals:   02/10/18 0957  BP: 130/80  Pulse: 66  Temp: 98.2 F (36.8 C)  TempSrc: Oral  SpO2: 94%  Weight: 284 lb (128.8 kg)  Height: 5\' 3"  (1.6 m)      Assessment & Plan:

## 2018-02-10 NOTE — Patient Instructions (Signed)
The other medicines for weight loss including saxenda (the daily shot), qsymia, contrave. Any of these might help.   We are checking the labs today and will call you back about the results.

## 2018-02-10 NOTE — Assessment & Plan Note (Signed)
She is currently taking belviq but she does not feel it is working lately. Offered saxenda, Minette [place], qsymia and she will check on coverage.

## 2018-02-10 NOTE — Assessment & Plan Note (Signed)
Suspect related to dehydration from both fluid pill and higher sugars. Encouraged to drink more fluids. Checking HgA1c and CMP and CBC and adjust as needed. No signs to suggest temporal arteritis and description does not match as well. No temporal tenderness and headaches are more diffuse.

## 2018-02-15 NOTE — Telephone Encounter (Signed)
Pt states this medication (qsymia) is not covered by insurance and would like to know if Dr. Sharlet Salina knew of any discounts she is able to use.

## 2018-02-16 NOTE — Telephone Encounter (Signed)
Pt states they tried a savings card at the pharmacy on Saturday and states that made the price increase. Pt aware that a PA is being submitted.

## 2018-02-16 NOTE — Telephone Encounter (Signed)
Tried calling patient phone kept ringing. Routing to [place] patient calls back. I am going to try a PA on the medication and see if I can get insurance to cover medication. But patient should be able to go on line and look up Qsymia savings card and print that off and take to the pharmacy to see if that would help lower the cost

## 2018-02-17 ENCOUNTER — Other Ambulatory Visit: Payer: Self-pay | Admitting: Internal Medicine

## 2018-02-17 NOTE — Telephone Encounter (Signed)
PA started on CoverMyMeds KEY: [id number]

## 2018-02-18 NOTE — Telephone Encounter (Signed)
Pa approved 02/17/2018-08/15/2018

## 2018-03-17 ENCOUNTER — Other Ambulatory Visit: Payer: Self-pay | Admitting: Internal Medicine

## 2018-03-23 ENCOUNTER — Other Ambulatory Visit: Payer: Self-pay | Admitting: Internal Medicine

## 2018-03-26 ENCOUNTER — Other Ambulatory Visit: Payer: Self-pay | Admitting: Internal Medicine

## 2018-04-26 ENCOUNTER — Other Ambulatory Visit: Payer: Self-pay | Admitting: Internal Medicine

## 2018-04-26 DIAGNOSIS — Z1231 Encounter for screening mammogram for malignant neoplasm of breast: Secondary | ICD-10-CM

## 2018-04-28 ENCOUNTER — Telehealth: Payer: Self-pay | Admitting: Internal Medicine

## 2018-04-28 MED ORDER — ALBUTEROL SULFATE HFA 108 (90 BASE) MCG/ACT IN AERS: INHALATION_SPRAY | RESPIRATORY_TRACT | 0 refills | Status: DC

## 2018-04-28 NOTE — Telephone Encounter (Signed)
Patient calling to notify Dr. Sharlet Salina that she requested VENTOLIN HFA 108 (90 Base) MCG/ACT inhaler  From her pharmacy and not valcyclovir. Please send her ventolin inhaler to the pharmacy instead.

## 2018-04-28 NOTE — Addendum Note (Signed)
Addended by: Raford Pitcher R on: 04/28/2018 01:31 PM   Modules accepted: Orders

## 2018-04-28 NOTE — Telephone Encounter (Signed)
sent 

## 2018-05-26 ENCOUNTER — Other Ambulatory Visit: Payer: Self-pay | Admitting: Internal Medicine

## 2018-05-31 ENCOUNTER — Ambulatory Visit
Admission: RE | Admit: 2018-05-31 | Discharge: 2018-05-31 | Disposition: A | Discharge status: Home / Self Care | Payer: BLUE CROSS/BLUE SHIELD | Source: Ambulatory Visit | Attending: Internal Medicine | Admitting: Internal Medicine

## 2018-05-31 DIAGNOSIS — Z1231 Encounter for screening mammogram for malignant neoplasm of breast: Secondary | ICD-10-CM

## 2018-10-06 DIAGNOSIS — Z6841 Body Mass Index (BMI) 40.0 and over, adult: Secondary | ICD-10-CM | POA: Insufficient documentation

## 2018-10-06 DIAGNOSIS — E66813 Obesity, class 3: Secondary | ICD-10-CM | POA: Insufficient documentation

## 2018-11-01 DIAGNOSIS — R600 Localized edema: Secondary | ICD-10-CM | POA: Insufficient documentation

## 2018-11-01 DIAGNOSIS — M25649 Stiffness of unspecified hand, not elsewhere classified: Secondary | ICD-10-CM | POA: Insufficient documentation

## 2018-12-24 ENCOUNTER — Other Ambulatory Visit: Payer: Self-pay | Admitting: Internal Medicine

## 2019-01-19 ENCOUNTER — Other Ambulatory Visit: Payer: Self-pay | Admitting: Internal Medicine

## 2019-01-25 ENCOUNTER — Other Ambulatory Visit: Payer: Self-pay | Admitting: Family

## 2019-04-13 ENCOUNTER — Other Ambulatory Visit: Payer: BLUE CROSS/BLUE SHIELD

## 2019-04-14 ENCOUNTER — Ambulatory Visit: Payer: BLUE CROSS/BLUE SHIELD | Attending: Internal Medicine

## 2019-04-14 DIAGNOSIS — Z20822 Contact with and (suspected) exposure to covid-19: Secondary | ICD-10-CM

## 2019-04-15 LAB — NOVEL CORONAVIRUS, NAA: SARS-CoV-2, NAA: NOT DETECTED

## 2019-04-20 ENCOUNTER — Other Ambulatory Visit: Payer: Self-pay | Admitting: Internal Medicine

## 2019-04-29 ENCOUNTER — Observation Stay (HOSPITAL_COMMUNITY)
Admission: EM | Admit: 2019-04-29 | Discharge: 2019-05-01 | Disposition: A | Discharge status: Home / Self Care | Payer: 59 | Attending: General Surgery | Admitting: General Surgery

## 2019-04-29 ENCOUNTER — Emergency Department (HOSPITAL_COMMUNITY): Payer: 59

## 2019-04-29 ENCOUNTER — Encounter (HOSPITAL_COMMUNITY): Payer: Self-pay

## 2019-04-29 ENCOUNTER — Encounter (HOSPITAL_COMMUNITY)
Admission: EM | Disposition: A | Discharge status: Home / Self Care | Payer: Self-pay | Source: Home / Self Care | Attending: Emergency Medicine

## 2019-04-29 ENCOUNTER — Other Ambulatory Visit: Payer: Self-pay

## 2019-04-29 ENCOUNTER — Observation Stay (HOSPITAL_COMMUNITY): Payer: 59 | Admitting: Anesthesiology

## 2019-04-29 DIAGNOSIS — Z20822 Contact with and (suspected) exposure to covid-19: Secondary | ICD-10-CM | POA: Insufficient documentation

## 2019-04-29 DIAGNOSIS — J45909 Unspecified asthma, uncomplicated: Secondary | ICD-10-CM | POA: Diagnosis present

## 2019-04-29 DIAGNOSIS — K8000 Calculus of gallbladder with acute cholecystitis without obstruction: Principal | ICD-10-CM | POA: Diagnosis present

## 2019-04-29 DIAGNOSIS — Z8261 Family history of arthritis: Secondary | ICD-10-CM

## 2019-04-29 DIAGNOSIS — Z79899 Other long term (current) drug therapy: Secondary | ICD-10-CM | POA: Insufficient documentation

## 2019-04-29 DIAGNOSIS — M1990 Unspecified osteoarthritis, unspecified site: Secondary | ICD-10-CM | POA: Diagnosis present

## 2019-04-29 DIAGNOSIS — B009 Herpesviral infection, unspecified: Secondary | ICD-10-CM | POA: Diagnosis present

## 2019-04-29 DIAGNOSIS — Z6841 Body Mass Index (BMI) 40.0 and over, adult: Secondary | ICD-10-CM | POA: Diagnosis not present

## 2019-04-29 DIAGNOSIS — Z8619 Personal history of other infectious and parasitic diseases: Secondary | ICD-10-CM | POA: Insufficient documentation

## 2019-04-29 DIAGNOSIS — Z888 Allergy status to other drugs, medicaments and biological substances status: Secondary | ICD-10-CM | POA: Diagnosis not present

## 2019-04-29 DIAGNOSIS — Z7982 Long term (current) use of aspirin: Secondary | ICD-10-CM | POA: Insufficient documentation

## 2019-04-29 DIAGNOSIS — R079 Chest pain, unspecified: Secondary | ICD-10-CM | POA: Diagnosis not present

## 2019-04-29 DIAGNOSIS — Z87891 Personal history of nicotine dependence: Secondary | ICD-10-CM | POA: Diagnosis not present

## 2019-04-29 DIAGNOSIS — Z79891 Long term (current) use of opiate analgesic: Secondary | ICD-10-CM

## 2019-04-29 DIAGNOSIS — K810 Acute cholecystitis: Secondary | ICD-10-CM | POA: Diagnosis present

## 2019-04-29 DIAGNOSIS — K819 Cholecystitis, unspecified: Secondary | ICD-10-CM | POA: Diagnosis present

## 2019-04-29 DIAGNOSIS — K82A1 Gangrene of gallbladder in cholecystitis: Secondary | ICD-10-CM | POA: Insufficient documentation

## 2019-04-29 DIAGNOSIS — I10 Essential (primary) hypertension: Secondary | ICD-10-CM | POA: Diagnosis not present

## 2019-04-29 DIAGNOSIS — Z8249 Family history of ischemic heart disease and other diseases of the circulatory system: Secondary | ICD-10-CM

## 2019-04-29 DIAGNOSIS — K851 Biliary acute pancreatitis without necrosis or infection: Secondary | ICD-10-CM

## 2019-04-29 HISTORY — PX: CHOLECYSTECTOMY: SHX55

## 2019-04-29 LAB — CREATININE, SERUM
Creatinine, Ser: 0.74 mg/dL (ref 0.44–1.00)
GFR calc Af Amer: >60 mL/min (ref >60)
GFR calc non Af Amer: >60 mL/min (ref >60)

## 2019-04-29 LAB — COMPREHENSIVE METABOLIC PANEL
ALT: 16 U/L (ref 0–44)
AST: 21 U/L (ref 15–41)
Albumin: 3.7 g/dL (ref 3.5–5.0)
Alkaline Phosphatase: 94 U/L (ref 38–126)
Anion gap: 11 (ref 5–15)
BUN: 17 mg/dL (ref 8–23)
CO2: 30 mmol/L (ref 22–32)
Calcium: 9.7 mg/dL (ref 8.9–10.3)
Chloride: 101 mmol/L (ref 98–111)
Creatinine, Ser: 0.78 mg/dL (ref 0.44–1.00)
GFR calc Af Amer: >60 mL/min (ref >60)
GFR calc non Af Amer: >60 mL/min (ref >60)
Glucose, Bld: 151 mg/dL — ABNORMAL HIGH (ref 70–99)
Potassium: 3.7 mmol/L (ref 3.5–5.1)
Sodium: 142 mmol/L (ref 135–145)
Total Bilirubin: 0.3 mg/dL (ref 0.3–1.2)
Total Protein: 8.3 g/dL — ABNORMAL HIGH (ref 6.5–8.1)

## 2019-04-29 LAB — CBC WITH DIFFERENTIAL/PLATELET
Abs Immature Granulocytes: 0.21 10*3/uL — ABNORMAL HIGH (ref 0.00–0.07)
Basophils Absolute: 0 10*3/uL (ref 0.0–0.1)
Basophils Relative: 0 %
Eosinophils Absolute: 0 10*3/uL (ref 0.0–0.5)
Eosinophils Relative: 0 %
HCT: 44.7 % (ref 36.0–46.0)
Hemoglobin: 13.9 g/dL (ref 12.0–15.0)
Immature Granulocytes: 2 %
Lymphocytes Relative: 8 %
Lymphs Abs: 1.1 10*3/uL (ref 0.7–4.0)
MCH: 26.6 pg (ref 26.0–34.0)
MCHC: 31.1 g/dL (ref 30.0–36.0)
MCV: 85.5 fL (ref 80.0–100.0)
Monocytes Absolute: 0.4 10*3/uL (ref 0.1–1.0)
Monocytes Relative: 3 %
Neutro Abs: 12.5 10*3/uL — ABNORMAL HIGH (ref 1.7–7.7)
Neutrophils Relative %: 87 %
Platelets: 238 10*3/uL (ref 150–400)
RBC: 5.23 MIL/uL — ABNORMAL HIGH (ref 3.87–5.11)
RDW: 18.8 % — ABNORMAL HIGH (ref 11.5–15.5)
WBC: 14.3 10*3/uL — ABNORMAL HIGH (ref 4.0–10.5)
nRBC: 0 % (ref 0.0–0.2)

## 2019-04-29 LAB — URINALYSIS, ROUTINE W REFLEX MICROSCOPIC
Bilirubin Urine: NEGATIVE
Glucose, UA: NEGATIVE mg/dL
Hgb urine dipstick: NEGATIVE
Ketones, ur: NEGATIVE mg/dL
Leukocytes,Ua: NEGATIVE
Nitrite: NEGATIVE
Protein, ur: NEGATIVE mg/dL
Specific Gravity, Urine: 1.014 (ref 1.005–1.030)
pH: 7 (ref 5.0–8.0)

## 2019-04-29 LAB — CBC
HCT: 50.1 % — ABNORMAL HIGH (ref 36.0–46.0)
Hemoglobin: 14.6 g/dL (ref 12.0–15.0)
MCH: 26.4 pg (ref 26.0–34.0)
MCHC: 29.1 g/dL — ABNORMAL LOW (ref 30.0–36.0)
MCV: 90.8 fL (ref 80.0–100.0)
Platelets: 200 10*3/uL (ref 150–400)
RBC: 5.52 MIL/uL — ABNORMAL HIGH (ref 3.87–5.11)
RDW: 19.9 % — ABNORMAL HIGH (ref 11.5–15.5)
WBC: 12.9 10*3/uL — ABNORMAL HIGH (ref 4.0–10.5)
nRBC: 0 % (ref 0.0–0.2)

## 2019-04-29 LAB — RESPIRATORY PANEL BY RT PCR (FLU A&B, COVID)
Influenza A by PCR: NEGATIVE
Influenza B by PCR: NEGATIVE
SARS Coronavirus 2 by RT PCR: NEGATIVE

## 2019-04-29 LAB — LIPASE, BLOOD: Lipase: 201 U/L — ABNORMAL HIGH (ref 11–51)

## 2019-04-29 SURGERY — LAPAROSCOPIC CHOLECYSTECTOMY
Anesthesia: General | Site: Abdomen

## 2019-04-29 MED ORDER — MIDAZOLAM HCL 2 MG/2ML IJ SOLN
INTRAMUSCULAR | Status: DC | PRN
  Administered 2019-04-29: 2 mg via INTRAVENOUS

## 2019-04-29 MED ORDER — ACETAMINOPHEN 10 MG/ML IV SOLN
1000.0000 mg | Freq: Once | INTRAVENOUS | Status: DC | PRN
  Administered 2019-04-29: 1000 mg via INTRAVENOUS

## 2019-04-29 MED ORDER — KETOROLAC TROMETHAMINE 30 MG/ML IJ SOLN
30.0000 mg | Freq: Four times a day (QID) | INTRAMUSCULAR | Status: DC | PRN
  Administered 2019-04-29 – 2019-04-30 (×2): 30 mg via INTRAVENOUS
  Filled 2019-04-29 (×2): qty 1

## 2019-04-29 MED ORDER — FUROSEMIDE 40 MG PO TABS
40.0000 mg | ORAL_TABLET | Freq: Every day | ORAL | Status: DC
  Administered 2019-04-30 – 2019-05-01 (×2): 40 mg via ORAL
  Filled 2019-04-29 (×2): qty 1

## 2019-04-29 MED ORDER — MIDAZOLAM HCL 2 MG/2ML IJ SOLN
INTRAMUSCULAR | Status: AC
  Filled 2019-04-29: qty 2

## 2019-04-29 MED ORDER — DIPHENHYDRAMINE HCL 50 MG/ML IJ SOLN: 12.5000 mg | Freq: Four times a day (QID) | INTRAMUSCULAR | Status: DC | PRN

## 2019-04-29 MED ORDER — ACETAMINOPHEN 10 MG/ML IV SOLN
INTRAVENOUS | Status: AC
  Filled 2019-04-29: qty 100

## 2019-04-29 MED ORDER — LABETALOL HCL 5 MG/ML IV SOLN
10.0000 mg | INTRAVENOUS | Status: DC | PRN
  Administered 2019-04-29: 10 mg via INTRAVENOUS

## 2019-04-29 MED ORDER — OXYCODONE HCL 5 MG PO TABS: 5.0000 mg | ORAL_TABLET | Freq: Four times a day (QID) | ORAL | 0 refills | Status: DC | PRN

## 2019-04-29 MED ORDER — MORPHINE SULFATE (PF) 2 MG/ML IV SOLN
2.0000 mg | INTRAVENOUS | Status: DC | PRN
  Administered 2019-04-29: 3 mg via INTRAVENOUS
  Filled 2019-04-29: qty 2

## 2019-04-29 MED ORDER — DEXAMETHASONE SODIUM PHOSPHATE 10 MG/ML IJ SOLN
INTRAMUSCULAR | Status: DC | PRN
  Administered 2019-04-29: 4 mg via INTRAVENOUS

## 2019-04-29 MED ORDER — DIPHENHYDRAMINE HCL 12.5 MG/5ML PO ELIX: 12.5000 mg | ORAL_SOLUTION | Freq: Four times a day (QID) | ORAL | Status: DC | PRN

## 2019-04-29 MED ORDER — SODIUM CHLORIDE 0.9% FLUSH
3.0000 mL | Freq: Once | INTRAVENOUS | Status: AC
  Administered 2019-04-29: 3 mL via INTRAVENOUS

## 2019-04-29 MED ORDER — PROMETHAZINE HCL 25 MG/ML IJ SOLN: 6.2500 mg | INTRAMUSCULAR | Status: DC | PRN

## 2019-04-29 MED ORDER — ENOXAPARIN SODIUM 40 MG/0.4ML ~~LOC~~ SOLN
40.0000 mg | SUBCUTANEOUS | Status: DC
  Administered 2019-04-30 – 2019-05-01 (×2): 40 mg via SUBCUTANEOUS
  Filled 2019-04-29 (×2): qty 0.4

## 2019-04-29 MED ORDER — ALBUTEROL SULFATE (2.5 MG/3ML) 0.083% IN NEBU
2.5000 mg | INHALATION_SOLUTION | Freq: Four times a day (QID) | RESPIRATORY_TRACT | Status: DC | PRN
  Administered 2019-04-29: 2.5 mg via RESPIRATORY_TRACT

## 2019-04-29 MED ORDER — 0.9 % SODIUM CHLORIDE (POUR BTL) OPTIME
TOPICAL | Status: DC | PRN
  Administered 2019-04-29: 1000 mL

## 2019-04-29 MED ORDER — LIDOCAINE 2% (20 MG/ML) 5 ML SYRINGE
INTRAMUSCULAR | Status: DC | PRN
  Administered 2019-04-29: 100 mg via INTRAVENOUS

## 2019-04-29 MED ORDER — BUPIVACAINE HCL (PF) 0.25 % IJ SOLN
INTRAMUSCULAR | Status: AC
  Filled 2019-04-29: qty 30

## 2019-04-29 MED ORDER — LACTATED RINGERS IR SOLN
Status: DC | PRN
  Administered 2019-04-29: 1000 mL

## 2019-04-29 MED ORDER — ONDANSETRON HCL 4 MG/2ML IJ SOLN
INTRAMUSCULAR | Status: DC | PRN
  Administered 2019-04-29: 4 mg via INTRAVENOUS

## 2019-04-29 MED ORDER — PROPOFOL 10 MG/ML IV BOLUS
INTRAVENOUS | Status: DC | PRN
  Administered 2019-04-29: 200 mg via INTRAVENOUS

## 2019-04-29 MED ORDER — SCOPOLAMINE 1 MG/3DAYS TD PT72
MEDICATED_PATCH | TRANSDERMAL | Status: AC
  Filled 2019-04-29: qty 1

## 2019-04-29 MED ORDER — FENTANYL CITRATE (PF) 250 MCG/5ML IJ SOLN
INTRAMUSCULAR | Status: DC | PRN
  Administered 2019-04-29 (×3): 50 ug via INTRAVENOUS

## 2019-04-29 MED ORDER — MORPHINE SULFATE (PF) 2 MG/ML IV SOLN: 2.0000 mg | INTRAVENOUS | Status: DC | PRN

## 2019-04-29 MED ORDER — ALBUTEROL SULFATE (2.5 MG/3ML) 0.083% IN NEBU
INHALATION_SOLUTION | RESPIRATORY_TRACT | Status: AC
  Filled 2019-04-29: qty 3

## 2019-04-29 MED ORDER — ROCURONIUM BROMIDE 10 MG/ML (PF) SYRINGE
PREFILLED_SYRINGE | INTRAVENOUS | Status: DC | PRN
  Administered 2019-04-29: 40 mg via INTRAVENOUS
  Administered 2019-04-29: 20 mg via INTRAVENOUS

## 2019-04-29 MED ORDER — ONDANSETRON 4 MG PO TBDP: 4.0000 mg | ORAL_TABLET | Freq: Four times a day (QID) | ORAL | Status: DC | PRN

## 2019-04-29 MED ORDER — ROCURONIUM BROMIDE 10 MG/ML (PF) SYRINGE
PREFILLED_SYRINGE | INTRAVENOUS | Status: AC
  Filled 2019-04-29: qty 10

## 2019-04-29 MED ORDER — LACTATED RINGERS IV SOLN: INTRAVENOUS | Status: DC

## 2019-04-29 MED ORDER — KETOROLAC TROMETHAMINE 30 MG/ML IJ SOLN
30.0000 mg | Freq: Once | INTRAMUSCULAR | Status: AC
  Administered 2019-04-29: 30 mg via INTRAVENOUS

## 2019-04-29 MED ORDER — LACTATED RINGERS IV SOLN: INTRAVENOUS | Status: DC | PRN

## 2019-04-29 MED ORDER — ALBUTEROL SULFATE (2.5 MG/3ML) 0.083% IN NEBU
3.0000 mL | INHALATION_SOLUTION | Freq: Four times a day (QID) | RESPIRATORY_TRACT | Status: DC | PRN
  Administered 2019-04-29 – 2019-04-30 (×2): 3 mL via RESPIRATORY_TRACT
  Filled 2019-04-29 (×2): qty 3

## 2019-04-29 MED ORDER — FENTANYL CITRATE (PF) 100 MCG/2ML IJ SOLN
INTRAMUSCULAR | Status: AC
  Filled 2019-04-29: qty 2

## 2019-04-29 MED ORDER — HYDROMORPHONE HCL 1 MG/ML IJ SOLN: 0.2500 mg | INTRAMUSCULAR | Status: DC | PRN

## 2019-04-29 MED ORDER — LABETALOL HCL 5 MG/ML IV SOLN
INTRAVENOUS | Status: AC
  Filled 2019-04-29: qty 4

## 2019-04-29 MED ORDER — PROPOFOL 10 MG/ML IV BOLUS
INTRAVENOUS | Status: AC
  Filled 2019-04-29: qty 20

## 2019-04-29 MED ORDER — FENTANYL CITRATE (PF) 100 MCG/2ML IJ SOLN
100.0000 ug | Freq: Once | INTRAMUSCULAR | Status: AC
  Administered 2019-04-29: 100 ug via INTRAVENOUS
  Filled 2019-04-29: qty 2

## 2019-04-29 MED ORDER — SODIUM CHLORIDE 0.9 % IV BOLUS
1000.0000 mL | Freq: Once | INTRAVENOUS | Status: AC
  Administered 2019-04-29: 1000 mL via INTRAVENOUS

## 2019-04-29 MED ORDER — OXYCODONE HCL 5 MG/5ML PO SOLN: 5.0000 mg | Freq: Once | ORAL | Status: DC | PRN

## 2019-04-29 MED ORDER — ONDANSETRON HCL 4 MG/2ML IJ SOLN: 4.0000 mg | Freq: Four times a day (QID) | INTRAMUSCULAR | Status: DC | PRN

## 2019-04-29 MED ORDER — OXYCODONE HCL 5 MG PO TABS: 5.0000 mg | ORAL_TABLET | ORAL | Status: DC | PRN

## 2019-04-29 MED ORDER — SUCCINYLCHOLINE CHLORIDE 200 MG/10ML IV SOSY
PREFILLED_SYRINGE | INTRAVENOUS | Status: DC | PRN
  Administered 2019-04-29: 180 mg via INTRAVENOUS

## 2019-04-29 MED ORDER — DIPHENHYDRAMINE HCL 25 MG PO CAPS: 25.0000 mg | ORAL_CAPSULE | Freq: Four times a day (QID) | ORAL | Status: DC | PRN

## 2019-04-29 MED ORDER — DEXTROSE-NACL 5-0.9 % IV SOLN: INTRAVENOUS | Status: DC

## 2019-04-29 MED ORDER — SCOPOLAMINE 1 MG/3DAYS TD PT72
1.0000 | MEDICATED_PATCH | TRANSDERMAL | Status: DC
  Administered 2019-04-29: 1 via TRANSDERMAL

## 2019-04-29 MED ORDER — TRAMADOL HCL 50 MG PO TABS
50.0000 mg | ORAL_TABLET | Freq: Four times a day (QID) | ORAL | Status: DC | PRN
  Administered 2019-04-30 – 2019-05-01 (×4): 50 mg via ORAL
  Filled 2019-04-29 (×4): qty 1

## 2019-04-29 MED ORDER — ONDANSETRON HCL 4 MG/2ML IJ SOLN
INTRAMUSCULAR | Status: AC
  Filled 2019-04-29: qty 2

## 2019-04-29 MED ORDER — LIDOCAINE 2% (20 MG/ML) 5 ML SYRINGE
INTRAMUSCULAR | Status: AC
  Filled 2019-04-29: qty 5

## 2019-04-29 MED ORDER — SUGAMMADEX SODIUM 200 MG/2ML IV SOLN
INTRAVENOUS | Status: DC | PRN
  Administered 2019-04-29: 270 mg via INTRAVENOUS

## 2019-04-29 MED ORDER — DEXAMETHASONE SODIUM PHOSPHATE 10 MG/ML IJ SOLN
INTRAMUSCULAR | Status: AC
  Filled 2019-04-29: qty 1

## 2019-04-29 MED ORDER — KETOROLAC TROMETHAMINE 30 MG/ML IJ SOLN
INTRAMUSCULAR | Status: AC
  Filled 2019-04-29: qty 1

## 2019-04-29 MED ORDER — SODIUM CHLORIDE 0.9 % IV SOLN
2.0000 g | INTRAVENOUS | Status: DC
  Administered 2019-04-29: 2 g via INTRAVENOUS
  Filled 2019-04-29: qty 20

## 2019-04-29 MED ORDER — DEXTROSE-NACL 5-0.45 % IV SOLN: INTRAVENOUS | Status: DC

## 2019-04-29 MED ORDER — METOPROLOL TARTRATE 5 MG/5ML IV SOLN: 5.0000 mg | Freq: Four times a day (QID) | INTRAVENOUS | Status: DC | PRN

## 2019-04-29 MED ORDER — ONDANSETRON HCL 4 MG/2ML IJ SOLN
4.0000 mg | Freq: Once | INTRAMUSCULAR | Status: AC
  Administered 2019-04-29: 4 mg via INTRAVENOUS
  Filled 2019-04-29: qty 2

## 2019-04-29 MED ORDER — OXYCODONE HCL 5 MG PO TABS: 5.0000 mg | ORAL_TABLET | Freq: Once | ORAL | Status: DC | PRN

## 2019-04-29 MED ORDER — DIPHENHYDRAMINE HCL 50 MG/ML IJ SOLN: 25.0000 mg | Freq: Four times a day (QID) | INTRAMUSCULAR | Status: DC | PRN

## 2019-04-29 SURGICAL SUPPLY — 43 items
APPLIER CLIP ROT 10 11.4 M/L (STAPLE)
BENZOIN TINCTURE PRP APPL 2/3 (GAUZE/BANDAGES/DRESSINGS) ×2 IMPLANT
BNDG ADH 1X3 SHEER STRL LF (GAUZE/BANDAGES/DRESSINGS) ×8 IMPLANT
CABLE HIGH FREQUENCY MONO STRZ (ELECTRODE) ×2 IMPLANT
CATH CHOLANG 76X19 KUMAR (CATHETERS) ×2 IMPLANT
CHLORAPREP W/TINT 26 (MISCELLANEOUS) ×2 IMPLANT
CLIP APPLIE ROT 10 11.4 M/L (STAPLE) IMPLANT
CLIP VESOLOCK LG 6/CT PURPLE (CLIP) IMPLANT
CLIP VESOLOCK MED LG 6/CT (CLIP) IMPLANT
COVER MAYO STAND STRL (DRAPES) ×2 IMPLANT
COVER SURGICAL LIGHT HANDLE (MISCELLANEOUS) ×2 IMPLANT
COVER WAND RF STERILE (DRAPES) IMPLANT
DECANTER SPIKE VIAL GLASS SM (MISCELLANEOUS) ×2 IMPLANT
DERMABOND ADVANCED (GAUZE/BANDAGES/DRESSINGS) ×1
DERMABOND ADVANCED .7 DNX12 (GAUZE/BANDAGES/DRESSINGS) IMPLANT
DRAIN CHANNEL 19F RND (DRAIN) IMPLANT
DRAPE C-ARM 42X120 X-RAY (DRAPES) IMPLANT
EVACUATOR SILICONE 100CC (DRAIN) IMPLANT
GLOVE BIOGEL PI IND STRL 7.0 (GLOVE) ×1 IMPLANT
GLOVE BIOGEL PI INDICATOR 7.0 (GLOVE) ×1
GLOVE SURG SS PI 7.0 STRL IVOR (GLOVE) ×2 IMPLANT
GOWN STRL REUS W/TWL LRG LVL3 (GOWN DISPOSABLE) ×2 IMPLANT
GOWN STRL REUS W/TWL XL LVL3 (GOWN DISPOSABLE) ×4 IMPLANT
GRASPER SUT TROCAR 14GX15 (MISCELLANEOUS) IMPLANT
KIT BASIN OR (CUSTOM PROCEDURE TRAY) ×2 IMPLANT
KIT TURNOVER KIT A (KITS) IMPLANT
PENCIL SMOKE EVACUATOR (MISCELLANEOUS) IMPLANT
POUCH RETRIEVAL ECOSAC 10 (ENDOMECHANICALS) ×1 IMPLANT
POUCH RETRIEVAL ECOSAC 10MM (ENDOMECHANICALS) ×1
SCISSORS LAP 5X35 DISP (ENDOMECHANICALS) ×2 IMPLANT
SET IRRIG TUBING LAPAROSCOPIC (IRRIGATION / IRRIGATOR) ×2 IMPLANT
SET TUBE SMOKE EVAC HIGH FLOW (TUBING) ×2 IMPLANT
SLEEVE XCEL OPT CAN 5 100 (ENDOMECHANICALS) ×4 IMPLANT
STOPCOCK 4 WAY LG BORE MALE ST (IV SETS) IMPLANT
STRIP CLOSURE SKIN 1/2X4 (GAUZE/BANDAGES/DRESSINGS) ×2 IMPLANT
SUT ETHILON 2 0 PS N (SUTURE) IMPLANT
SUT MNCRL AB 4-0 PS2 18 (SUTURE) ×2 IMPLANT
SUT VICRYL 0 ENDOLOOP (SUTURE) IMPLANT
TOWEL OR 17X26 10 PK STRL BLUE (TOWEL DISPOSABLE) ×2 IMPLANT
TOWEL OR NON WOVEN STRL DISP B (DISPOSABLE) IMPLANT
TRAY LAPAROSCOPIC (CUSTOM PROCEDURE TRAY) ×2 IMPLANT
TROCAR BLADELESS OPT 5 100 (ENDOMECHANICALS) ×2 IMPLANT
TROCAR XCEL NON-BLD 11X100MML (ENDOMECHANICALS) ×2 IMPLANT

## 2019-04-29 NOTE — ED Triage Notes (Signed)
Pt from home reports chest pain that increases with movement palpation or deep inhalation.Marland Kitchen admits to N/V denies cardiac Hx

## 2019-04-29 NOTE — ED Notes (Signed)
Lab draw unsuccessful RN made aware 

## 2019-04-29 NOTE — Anesthesia Preprocedure Evaluation (Addendum)
Anesthesia Evaluation  Patient identified by MRN, date of birth, ID band Patient awake    Reviewed: Allergy & Precautions, NPO status , Patient's Chart, lab work & pertinent test results  Airway Mallampati: IV  TM Distance: >3 FB Neck ROM: Full    Dental no notable dental hx.    Pulmonary asthma , former smoker,    Pulmonary exam normal breath sounds clear to auscultation       Cardiovascular hypertension, Normal cardiovascular exam Rhythm:Regular Rate:Normal  ECG: SR, rate 70   Neuro/Psych  Headaches, negative psych ROS   GI/Hepatic Neg liver ROS, N/V   Endo/Other  Morbid obesity  Renal/GU negative Renal ROS     Musculoskeletal negative musculoskeletal ROS (+)   Abdominal   Peds  Hematology negative hematology ROS (+)   Anesthesia Other Findings cholecystitis  Reproductive/Obstetrics                            Anesthesia Physical Anesthesia Plan  ASA: IV  Anesthesia Plan: General   Post-op Pain Management:    Induction: Intravenous and Rapid sequence  PONV Risk Score and Plan: 4 or greater and Scopolamine patch - Pre-op, Midazolam, Dexamethasone, Ondansetron and Treatment may vary due to age or medical condition  Airway Management Planned: Oral ETT  Additional Equipment:   Intra-op Plan:   Post-operative Plan: Extubation in OR  Informed Consent: I have reviewed the patients History and Physical, chart, labs and discussed the procedure including the risks, benefits and alternatives for the proposed anesthesia with the patient or authorized representative who has indicated his/her understanding and acceptance.     Dental advisory given  Plan Discussed with: CRNA  Anesthesia Plan Comments:         Anesthesia Quick Evaluation

## 2019-04-29 NOTE — Progress Notes (Signed)
Attempted contacting spouse 2 times without answer or ability to leave voicemail

## 2019-04-29 NOTE — Anesthesia Procedure Notes (Signed)
Procedure Name: Intubation Date/Time: 04/29/2019 11:39 AM Performed by: Eben Burow, CRNA Pre-anesthesia Checklist: Patient identified, Emergency Drugs available, Suction available, Patient being monitored and Timeout performed Patient Re-evaluated:Patient Re-evaluated prior to induction Oxygen Delivery Method: Circle system utilized Preoxygenation: Pre-oxygenation with 100% oxygen Induction Type: IV induction Ventilation: Mask ventilation without difficulty Laryngoscope Size: Mac and 4 Grade View: Grade II Tube type: Oral Tube size: 7.0 mm Number of attempts: 1 Airway Equipment and Method: Stylet Placement Confirmation: ETT inserted through vocal cords under direct vision,  positive ETCO2 and breath sounds checked- equal and bilateral Secured at: 22 cm Tube secured with: Tape Dental Injury: Teeth and Oropharynx as per pre-operative assessment

## 2019-04-29 NOTE — Op Note (Signed)
PATIENT:  Linda Cabrera  64 y.o. female  PRE-OPERATIVE DIAGNOSIS:  cholecystitis  POST-OPERATIVE DIAGNOSIS:  cholecystitis`  PROCEDURE:  Procedure(s): LAPAROSCOPIC CHOLECYSTECTOMY  SURGEON:  Surgeon(s): Zanetta Dehaan, Arta Bruce, MD  ASSISTANT: Alferd Apa, M.D.  ANESTHESIA:   local and general  Indications for procedure: [name] is a 64 y.o. female with symptoms of Abdominal pain and Nausea and vomiting consistent with gallbladder disease, Confirmed by Ultrasound.  Description of procedure: The patient was brought into the operative suite, placed supine. Anesthesia was administered with endotracheal tube. Patient was strapped in place and foot board was secured. All pressure points were offloaded by foam padding. The patient was prepped and draped in the usual sterile fashion.  A small incision was made to the right of the umbilicus. A 40mm trocar was inserted into the peritoneal cavity with optical entry. Pneumoperitoneum was applied with high flow low pressure. 2 64mm trocars were placed in the RUQ. A 78mm trocar was placed in the subxiphoid space. Marcaine was infused to the subxiphoid space and lateral upper right abdomen in the transversus abdominis plane. Next the patient was placed in reverse trendelenberg. The gallbladder was hemorrhagic and necrotic and tense. It was drained with suction to allow retraction. The left lobe of the liver was quite large making visualization difficult.  The gallbladder was retracted cephalad and lateral. The peritoneum was reflected off the infundibulum working lateral to medial. The cystic duct and cystic artery were identified and further dissection revealed a critical view. The cystic duct and cystic artery were doubly clipped and ligated.   The gallbladder was removed off the liver bed with cautery. The Gallbladder was placed in a specimen bag. The gallbladder fossa was irrigated and hemostasis was applied with cautery. The gallbladder was  removed via the 33mm trocar. Liver injury was made in retraction that was repaired with cautery. The fascial defect was closed with interrupted 0 vicryl suture via laparoscopic trans-fascial suture passer. Pneumoperitoneum was removed, all trocar were removed. All incisions were closed with 4-0 monocryl subcuticular stitch. The patient woke from anesthesia and was brought to PACU in stable condition. All counts were correct  Findings: acute gangrenous cholecystitis  Specimen: gallbladder  Blood loss: 50 ml  Local anesthesia: 30 ml marcaine  Complications: none  PLAN OF CARE: Admit for overnight observation  PATIENT DISPOSITION:  PACU - hemodynamically stable.  Images:   Gurney Maxin, M.D. General, Bariatric, & Minimally Invasive Surgery [facility]

## 2019-04-29 NOTE — H&P (Signed)
[facility] Admission Note  Linda Cabrera 02-04-1956  OJ:[id number].    Requesting MD: Shanon Rosser Chief Complaint: Chest pain increases with movement, palpation or deep inhalation nausea and vomiting, abdominal pain Reason for Consult:   HPI: Patient is a 64 year old female presents with right upper quadrant pain going to her right flank which started yesterday afternoon around 5 PM.  She describes the pain is sharp and rates it 10/10.  Pain was worse with movement palpation and deep breathing she has had nausea and vomiting but no fever.  She was not short of breath.  Work-up in the ED she is afebrile blood pressure is elevated 153/104, heart rate was eighty-eight, sats are 98% on room air.  Labs show a glucose of one fifty-one, lipase of two oh one, AST twenty-one, ALT of sixteen, total protein 8.3, total bilirubin 0.3.  WBC 14.3, hemoglobin 13.9, hematocrit 44.7, platelets a hundred thirty-eight thousand. Covid test 2 weeks ago was negative.  Chest x-ray was normal.  Abdominal ultrasound shows gallbladder with shadowing and layering gallstones gallbladder is distended there is focal tenderness but no wall thickening or pericholecystic edema.  CBD is 3 mm.  We are asked to see. Covid test is also negative this AM.  ROS: Review of Systems  Constitutional: Negative.   HENT: Negative.   Eyes: Negative.   Respiratory: Negative.   Cardiovascular: Negative for palpitations, orthopnea, claudication, leg swelling and PND.       Pt described pain on admit, but it is RUQ pain going into chest and side.  Gastrointestinal: Positive for abdominal pain, heartburn, nausea and vomiting. Negative for blood in stool, constipation, diarrhea and melena.  Genitourinary: Negative.   Musculoskeletal: Negative.   Skin: Negative.   Neurological: Negative.   Endo/Heme/Allergies: Negative.   Psychiatric/Behavioral: Negative.     Family History  Problem Relation Age of Onset  . Arthritis Mother    . Hypertension Mother   . Cancer Mother        pancreatic  . Pancreatic cancer Mother   . Arthritis Father   . Hypertension Father   . Colon cancer Neg Hx     Past Medical History:  Diagnosis Date  . Anemia, unspecified 07/13/2015  . Arthritis   . Asthma   . Herpes   . Hypertension     Past Surgical History:  Procedure Laterality Date  . BREATH TEK H PYLORI N/A 02/13/2014   Procedure: BREATH TEK H PYLORI;  Surgeon: Gayland Curry, MD;  Location: [facility];  Service: General;  Laterality: N/A;  . COLONOSCOPY N/A 08/15/2013   Procedure: COLONOSCOPY;  Surgeon: Jerene Bears, MD;  Location: [facility];  Service: Gastroenterology;  Laterality: N/A;  . TUBAL LIGATION      Social History:  reports that she quit smoking about 14 years ago. She has never used smokeless tobacco. She reports that she does not drink alcohol or use drugs.  Allergies:  Allergies  Allergen Reactions  . Ace Inhibitors Swelling    Prior to Admission medications   Medication Sig Start Date End Date Taking? Authorizing Provider  albuterol (VENTOLIN HFA) 108 (90 Base) MCG/ACT inhaler INHALE TWO PUFFS INTO THE LUNGS EVERY 6 HOURS AS NEEDED FOR WHEEZING OR SHORTNESS OF BREATH 05/26/18  Yes Hoyt Koch, MD  aspirin EC 81 MG tablet Take 1 tablet (81 mg total) by mouth daily. 05/10/15  Yes Biagio Borg, MD  furosemide (LASIX) 40 MG tablet Take 1 tablet (40 mg total) by mouth  daily. 08/14/17  Yes Hoyt Koch, MD  Phentermine-Topiramate [facility]) 3.75-23 MG CP24 Take 2 tablets by mouth daily. 03/24/18  Yes Hoyt Koch, MD  valACYclovir (VALTREX) 1000 MG tablet Take 1 tablet (1,000 mg total) by mouth daily. **OFFICE VISIT IS DUE** 01/19/19  Yes Hoyt Koch, MD  benzonatate (TESSALON) 200 MG capsule TAKE ONE CAPSULE BY MOUTH THREE TIMES DAILY 01/21/18   Hoyt Koch, MD  diclofenac (VOLTAREN) 75 MG EC tablet TAKE ONE TABLET BY MOUTH TWICE DAILY 07/16/15   Hoyt Koch, MD  furosemide (LASIX) 40 MG tablet Take 1 tablet (40 mg total) by mouth daily. 09/14/17   Janith Lima, MD  furosemide (LASIX) 40 MG tablet TAKE ONE TABLET (40 MG) BY MOUTH EVERY DAY 12/24/18   Marrian Salvage, FNP  montelukast (SINGULAIR) 10 MG tablet TAKE ONE TABLET BY MOUTH AT BEDTIME 06/01/17   Hoyt Koch, MD     Blood pressure (!) 167/106, pulse 70, temperature 97.6 F (36.4 C), temperature source Oral, resp. rate 18, last menstrual period 08/25/2014, SpO2 98 %. Physical Exam: Physical Exam Constitutional:      General: She is not in acute distress.    Appearance: She is obese. She is ill-appearing. She is not toxic-appearing or diaphoretic.  HENT:     Head: Normocephalic.     Mouth/Throat:     Pharynx: Oropharynx is clear.  Eyes:     General: No scleral icterus. Cardiovascular:     Rate and Rhythm: Normal rate and regular rhythm.     Heart sounds: No murmur.  Pulmonary:     Effort: Pulmonary effort is normal.     Breath sounds: Normal breath sounds.  Abdominal:     General: Bowel sounds are normal.     Palpations: Abdomen is soft.     Tenderness: There is abdominal tenderness in the right upper quadrant.     Hernia: No hernia is present.     Comments: Large abdomen with pain RUQ  Skin:    General: Skin is warm and dry.     Capillary Refill: Capillary refill takes 2 to 3 seconds.  Neurological:     General: No focal deficit present.     Mental Status: She is oriented to person, place, and time.     Cranial Nerves: No cranial nerve deficit.  Psychiatric:        Mood and Affect: Mood normal. Mood is not anxious or depressed.        Behavior: Behavior normal.     Results for orders placed or performed during the hospital encounter of 04/29/19 (from the past 48 hour(s))  CBC with Differential     Status: Abnormal   Collection Time: 04/29/19  6:16 AM  Result Value Ref Range   WBC 14.3 (H) 4.0 - 10.5 K/uL   RBC 5.23 (H) 3.87 - 5.11 MIL/uL    Hemoglobin 13.9 12.0 - 15.0 g/dL   HCT 44.7 36.0 - 46.0 %   MCV 85.5 80.0 - 100.0 fL   MCH 26.6 26.0 - 34.0 pg   MCHC 31.1 30.0 - 36.0 g/dL   RDW 18.8 (H) 11.5 - 15.5 %   Platelets 238 150 - 400 K/uL   nRBC 0.0 0.0 - 0.2 %   Neutrophils Relative % 87 %   Neutro Abs 12.5 (H) 1.7 - 7.7 K/uL   Lymphocytes Relative 8 %   Lymphs Abs 1.1 0.7 - 4.0 K/uL   Monocytes Relative 3 %  Monocytes Absolute 0.4 0.1 - 1.0 K/uL   Eosinophils Relative 0 %   Eosinophils Absolute 0.0 0.0 - 0.5 K/uL   Basophils Relative 0 %   Basophils Absolute 0.0 0.0 - 0.1 K/uL   Immature Granulocytes 2 %   Abs Immature Granulocytes 0.21 (H) 0.00 - 0.07 K/uL    Comment: Performed at [facility], [place] [address] [place], [place] [address]  Comprehensive metabolic panel     Status: Abnormal   Collection Time: 04/29/19  6:16 AM  Result Value Ref Range   Sodium 142 135 - 145 mmol/L   Potassium 3.7 3.5 - 5.1 mmol/L   Chloride 101 98 - 111 mmol/L   CO2 30 22 - 32 mmol/L   Glucose, Bld 151 (H) 70 - 99 mg/dL   BUN 17 8 - 23 mg/dL   Creatinine, Ser 0.78 0.44 - 1.00 mg/dL   Calcium 9.7 8.9 - 10.3 mg/dL   Total Protein 8.3 (H) 6.5 - 8.1 g/dL   Albumin 3.7 3.5 - 5.0 g/dL   AST 21 15 - 41 U/L   ALT 16 0 - 44 U/L   Alkaline Phosphatase 94 38 - 126 U/L   Total Bilirubin 0.3 0.3 - 1.2 mg/dL   GFR calc non Af Amer >60 >60 mL/min   GFR calc Af Amer >60 >60 mL/min   Anion gap 11 5 - 15    Comment: Performed at [facility], [place] [address] [place], [place] [address]  Lipase, blood     Status: Abnormal   Collection Time: 04/29/19  6:16 AM  Result Value Ref Range   Lipase 201 (H) 11 - 51 U/L    Comment: Performed at [facility], [place] [address] [place],  [address]   DG Chest 2 View  Result Date: 04/29/2019 CLINICAL DATA:  Sudden onset mid chest pain EXAM: CHEST - 2 VIEW COMPARISON:  09/17/2015 FINDINGS: Normal heart size and mediastinal contours. No acute  infiltrate or edema. No effusion or pneumothorax. No acute osseous findings. Degenerative endplate spurring. Artifact from EKG leads. IMPRESSION: No active cardiopulmonary disease. Electronically Signed   By: Monte Fantasia M.D.   On: 04/29/2019 05:47   US Abdomen Complete  Result Date: 04/29/2019 CLINICAL DATA:  Abdominal pain since yesterday EXAM: ABDOMEN ULTRASOUND COMPLETE COMPARISON:  Abdominal CT 09/25/2011 FINDINGS: Gallbladder: Shadowing and layering gallstones. The gallbladder is distended there is focal tenderness per sonographer report, but no wall thickening or pericholecystic edema Common bile duct: Diameter: 3 mm Liver: No focal lesion identified. Within normal limits in parenchymal echogenicity. Portal vein is patent on color Doppler imaging with normal direction of blood flow towards the liver. IVC: No abnormality visualized. Pancreas: Visualized portion unremarkable. Spleen: Size and appearance within normal limits. Right Kidney: Length: 12 cm. Echogenicity within normal limits. No mass or hydronephrosis visualized. Left Kidney: Length: 11 cm. Echogenicity within normal limits. No mass or hydronephrosis visualized. Abdominal aorta: No aneurysm visualized. Incomplete coverage due to shadowing bowel gas. IMPRESSION: Cholelithiasis and gallbladder tenderness but no wall thickening or right upper quadrant fluid typical of acute cholecystitis. Electronically Signed   By: Monte Fantasia M.D.   On: 04/29/2019 07:05      Assessment/Plan Hx hypertension Hx of herpes Hx positive H. pylori 02/13/2014 Morbid obesity Hx anemia Hx arthritis  Acute cholecystitis, cholelithiasis, mild pancreatitis  Plan: Admit to observation.  IV antibiotics, IV fluids.  She has been seen and evaluated by Dr. Kieth Brightly.  His plan taken  to the operating room for laparoscopic cholecystectomy this AM.  Risk and benefits of surgery were discussed in detail, informed operative consent was  obtained.   Earnstine [facility] 04/29/2019, 7:30 AM Please see Amion for pager number during day hours 7:00am-4:30pm

## 2019-04-29 NOTE — Discharge Instructions (Signed)
[facility]  Please arrive at least 30 min before your appointment to complete your check in paperwork.  If you are unable to arrive 30 min prior to your appointment time we may have to cancel or reschedule you. LAPAROSCOPIC SURGERY: POST OP INSTRUCTIONS Always review your discharge instruction sheet given to you by the facility where your surgery was performed. IF YOU HAVE DISABILITY OR FAMILY LEAVE FORMS, YOU MUST BRING THEM TO THE OFFICE FOR PROCESSING.   DO NOT GIVE THEM TO YOUR DOCTOR.  PAIN CONTROL  1. First take acetaminophen (Tylenol) AND/or ibuprofen (Advil) to control your pain after surgery.  Follow directions on package.  Taking acetaminophen (Tylenol) and/or ibuprofen (Advil) regularly after surgery will help to control your pain and lower the amount of prescription pain medication you may need.  You should not take more than 4,000 mg (4 grams) of acetaminophen (Tylenol) in 24 hours.  You should not take ibuprofen (Advil), aleve, motrin, naprosyn or other NSAIDS if you have a history of stomach ulcers or chronic kidney disease.  2. A prescription for pain medication may be given to you upon discharge.  Take your pain medication as prescribed, if you still have uncontrolled pain after taking acetaminophen (Tylenol) or ibuprofen (Advil). 3. Use ice packs to help control pain. 4. If you need a refill on your pain medication, please contact your pharmacy.  They will contact our office to request authorization. Prescriptions will not be filled after 5pm or on week-ends.  HOME MEDICATIONS 5. Take your usually prescribed medications unless otherwise directed.  DIET 6. You should follow a light diet the first few days after arrival home.  Be sure to include lots of fluids daily. Avoid fatty, fried foods.   CONSTIPATION 7. It is common to experience some constipation after surgery and if you are taking pain medication.  Increasing fluid intake and taking a stool  softener (such as Colace) will usually help or prevent this problem from occurring.  A mild laxative (Milk of Magnesia or Miralax) should be taken according to package instructions if there are no bowel movements after 48 hours.  WOUND/INCISION CARE 8. Most patients will experience some swelling and bruising in the area of the incisions.  Ice packs will help.  Swelling and bruising can take several days to resolve.  9. Unless discharge instructions indicate otherwise, follow guidelines below  a. STERI-STRIPS - you may remove your outer bandages 48 hours after surgery, and you may shower at that time.  You have steri-strips (small skin tapes) in place directly over the incision.  These strips should be left on the skin for 7-10 days.   b. DERMABOND/SKIN GLUE - you may shower in 24 hours.  The glue will flake off over the next 2-3 weeks. 10. Any sutures or staples will be removed at the office during your follow-up visit.  ACTIVITIES 11. You may resume regular (light) daily activities beginning the next day--such as daily self-care, walking, climbing stairs--gradually increasing activities as tolerated.  You may have sexual intercourse when it is comfortable.  Refrain from any heavy lifting or straining until approved by your doctor. a. You may drive when you are no longer taking prescription pain medication, you can comfortably wear a seatbelt, and you can safely maneuver your car and apply brakes.  FOLLOW-UP 12. You should see your doctor in the office for a follow-up appointment approximately 2-3 weeks after your surgery.  You should have been given your post-op/follow-up appointment when   your surgery was scheduled.  If you did not receive a post-op/follow-up appointment, make sure that you call for this appointment within a day or two after you arrive home to insure a convenient appointment time.   WHEN TO CALL YOUR DOCTOR: 1. Fever over 101.0 2. Inability to urinate 3. Continued bleeding from  incision. 4. Increased pain, redness, or drainage from the incision. 5. Increasing abdominal pain  The clinic staff is available to answer your questions during regular business hours.  Please don't hesitate to call and ask to speak to one of the nurses for clinical concerns.  If you have a medical emergency, go to the nearest emergency room or call 911.  A surgeon from [facility] is always on call at the hospital. [address], [address], [place], [place]  [address] ? [address] [address], [place], [place]   [address] [phone] ? [phone] ? FAX [phone]    Gallbladder Eating Plan If you have a gallbladder condition, you may have trouble digesting fats. Eating a low-fat diet can help reduce your symptoms, and may be helpful before and after having surgery to remove your gallbladder (cholecystectomy). Your health care provider may recommend that you work with a diet and nutrition specialist (dietitian) to help you reduce the amount of fat in your diet. What are tips for following this plan? General guidelines  Limit your fat intake to less than 30% of your total daily calories. If you eat around 1,800 calories each day, this is less than 60 grams (g) of fat per day.  Fat is an important part of a healthy diet. Eating a low-fat diet can make it hard to maintain a healthy body weight. Ask your dietitian how much fat, calories, and other nutrients you need each day.  Eat small, frequent meals throughout the day instead of three large meals.  Drink at least 8-10 cups of fluid a day. Drink enough fluid to keep your urine clear or pale yellow.  Limit alcohol intake to no more than 1 drink a day for nonpregnant women and 2 drinks a day for men. One drink equals 12 oz of beer, 5 oz of wine, or 1 oz of hard liquor. Reading food labels  Check Nutrition Facts on food labels for the amount of fat per serving. Choose foods with less than 3 grams of fat per  serving. Shopping  Choose nonfat and low-fat healthy foods. Look for the words "nonfat," "low fat," or "fat free."  Avoid buying processed or prepackaged foods. Cooking  Cook using low-fat methods, such as baking, broiling, grilling, or boiling.  Cook with small amounts of healthy fats, such as olive oil, grapeseed oil, canola oil, or sunflower oil. What foods are recommended?   All fresh, frozen, or canned fruits and vegetables.  Whole grains.  Low-fat or non-fat (skim) milk and yogurt.  Lean meat, skinless poultry, fish, eggs, and beans.  Low-fat protein supplement powders or drinks.  Spices and herbs. What foods are not recommended?  High-fat foods. These include baked goods, fast food, fatty cuts of meat, ice cream, french toast, sweet rolls, pizza, cheese bread, foods covered with butter, creamy sauces, or cheese.  Fried foods. These include french fries, tempura, battered fish, breaded chicken, fried breads, and sweets.  Foods with strong odors.  Foods that cause bloating and gas. Summary  A low-fat diet can be helpful if you have a gallbladder condition, or before and after gallbladder surgery.  Limit your fat intake to   less than 30% of your total daily calories. This is about 60 g of fat if you eat 1,800 calories each day.  Eat small, frequent meals throughout the day instead of three large meals. This information is not intended to replace advice given to you by your health care provider. Make sure you discuss any questions you have with your health care provider. Document Revised: 07/22/2018 Document Reviewed: 05/08/2016 Elsevier Patient Education  2020 Elsevier Inc.  

## 2019-04-29 NOTE — Transfer of Care (Signed)
Immediate Anesthesia Transfer of Care Note  Patient: Linda Cabrera  Procedure(s) Performed: LAPAROSCOPIC CHOLECYSTECTOMY (N/A Abdomen)  Patient Location: PACU  Anesthesia Type:General  Level of Consciousness: awake, drowsy and patient cooperative  Airway & Oxygen Therapy: Patient Spontanous Breathing and Patient connected to face mask oxygen  Post-op Assessment: Report given to RN and Post -op Vital signs reviewed and stable  Post vital signs: Reviewed and stable  Last Vitals:  Vitals Value Taken Time  BP    Temp    Pulse    Resp    SpO2      Last Pain:  Vitals:   04/29/19 1043  TempSrc: Oral  PainSc:          Complications: No apparent anesthesia complications

## 2019-04-29 NOTE — ED Provider Notes (Addendum)
[facility] Provider Note: Georgena Spurling, MD, FACEP  CSN: [id number] MRN: [id number] ARRIVAL: 04/29/19 at [place]: [place]  Abdominal Pain   HISTORY OF PRESENT ILLNESS  04/29/19 5:29 AM Linda Cabrera is a 64 y.o. female with right upper quadrant abdominal pain radiating to her right flank since yesterday afternoon about 5 PM. She describes the pain as sharp, aching and constant and rates it as a 10 out of 10.  It is worse with movement, palpation or deep breathing.  She has had associated nausea and vomiting but no fever.  She is not short of breath.   Past Medical History:  Diagnosis Date  . Anemia, unspecified 07/13/2015  . Arthritis   . Asthma   . Herpes   . Hypertension     Past Surgical History:  Procedure Laterality Date  . BREATH TEK H PYLORI N/A 02/13/2014   Procedure: BREATH TEK H PYLORI;  Surgeon: Gayland Curry, MD;  Location: [facility];  Service: General;  Laterality: N/A;  . COLONOSCOPY N/A 08/15/2013   Procedure: COLONOSCOPY;  Surgeon: Jerene Bears, MD;  Location: [facility];  Service: Gastroenterology;  Laterality: N/A;  . TUBAL LIGATION      Family History  Problem Relation Age of Onset  . Arthritis Mother   . Hypertension Mother   . Cancer Mother        pancreatic  . Pancreatic cancer Mother   . Arthritis Father   . Hypertension Father   . Colon cancer Neg Hx     Social History   Tobacco Use  . Smoking status: Former Smoker    Quit date: 06/16/2004    Years since quitting: 14.8  . Smokeless tobacco: Never Used  Substance Use Topics  . Alcohol use: No  . Drug use: No    Comment: remote h/o crack    Prior to Admission medications   Medication Sig Start Date End Date Taking? Authorizing Provider  albuterol (VENTOLIN HFA) 108 (90 Base) MCG/ACT inhaler INHALE TWO PUFFS INTO THE LUNGS EVERY 6 HOURS AS NEEDED FOR WHEEZING OR SHORTNESS OF BREATH Patient taking differently: Inhale 2 puffs into the lungs every 6  (six) hours as needed for wheezing or shortness of breath.  05/26/18  Yes Hoyt Koch, MD  aspirin EC 81 MG tablet Take 1 tablet (81 mg total) by mouth daily. 05/10/15  Yes Biagio Borg, MD  furosemide (LASIX) 40 MG tablet TAKE ONE TABLET (40 MG) BY MOUTH EVERY DAY Patient taking differently: Take 40 mg by mouth daily.  12/24/18  Yes Marrian Salvage, FNP  Phentermine-Topiramate (QSYMIA) 3.75-23 MG CP24 Take 2 tablets by mouth daily. 03/24/18  Yes Hoyt Koch, MD  valACYclovir (VALTREX) 1000 MG tablet Take 1 tablet (1,000 mg total) by mouth daily. **OFFICE VISIT IS DUE** Patient taking differently: Take 1,000 mg by mouth daily.  01/19/19  Yes Hoyt Koch, MD  benzonatate (TESSALON) 200 MG capsule TAKE ONE CAPSULE BY MOUTH THREE TIMES DAILY Patient not taking: Reported on 04/29/2019 01/21/18   Hoyt Koch, MD  diclofenac (VOLTAREN) 75 MG EC tablet TAKE ONE TABLET BY MOUTH TWICE DAILY Patient not taking: Reported on 04/29/2019 07/16/15   Hoyt Koch, MD  furosemide (LASIX) 40 MG tablet Take 1 tablet (40 mg total) by mouth daily. Patient not taking: Reported on 04/29/2019 08/14/17   Hoyt Koch, MD  furosemide (LASIX) 40 MG tablet Take 1 tablet (40 mg total) by  mouth daily. Patient not taking: Reported on 04/29/2019 09/14/17   Janith Lima, MD  montelukast (SINGULAIR) 10 MG tablet TAKE ONE TABLET BY MOUTH AT BEDTIME Patient not taking: Reported on 04/29/2019 06/01/17   Hoyt Koch, MD  oxyCODONE (ROXICODONE) 5 MG immediate release tablet Take 1 tablet (5 mg total) by mouth every 6 (six) hours as needed for breakthrough pain. 04/29/19 04/28/20  Maczis, Barth Kirks, PA-C    Allergies Ace inhibitors   REVIEW OF SYSTEMS  Negative except as noted here or in the History of Present Illness.   PHYSICAL EXAMINATION  Initial Vital Signs Blood pressure (!) 153/104, pulse 82, resp. rate 20, last menstrual period 08/25/2014, SpO2 100  %.  Examination General: Well-developed, obese female in no acute distress; appearance consistent with age of record HENT: normocephalic; atraumatic Eyes: pupils equal, round and reactive to light; extraocular muscles intact Neck: supple Heart: regular rate and rhythm Lungs: clear to auscultation bilaterally Abdomen: soft; obese; right upper quadrant tenderness; bowel sounds present Extremities: No deformity; full range of motion; pulses normal Neurologic: Awake, alert and oriented; motor function intact in all extremities and symmetric; no facial droop Skin: Warm and dry Psychiatric: Grimacing   RESULTS  Summary of this visit's results, reviewed and interpreted by myself:   EKG Interpretation  Date/Time:  Friday April 29 2019 02:22:49 EST Ventricular Rate:  70 PR Interval:    QRS Duration: 94 QT Interval:  418 QTC Calculation: 451 R Axis:   10 Text Interpretation: Sinus rhythm Baseline wander in lead(s) V1 V2 V3 V4 V5 V6 Rate is faster Confirmed by Lizandro Spellman, Jenny Reichmann [phone]) on 04/29/2019 5:16:22 AM      Laboratory Studies: Results for orders placed or performed during the hospital encounter of 04/29/19 (from the past 24 hour(s))  CBC with Differential     Status: Abnormal   Collection Time: 04/29/19  6:16 AM  Result Value Ref Range   WBC 14.3 (H) 4.0 - 10.5 K/uL   RBC 5.23 (H) 3.87 - 5.11 MIL/uL   Hemoglobin 13.9 12.0 - 15.0 g/dL   HCT 44.7 36.0 - 46.0 %   MCV 85.5 80.0 - 100.0 fL   MCH 26.6 26.0 - 34.0 pg   MCHC 31.1 30.0 - 36.0 g/dL   RDW 18.8 (H) 11.5 - 15.5 %   Platelets 238 150 - 400 K/uL   nRBC 0.0 0.0 - 0.2 %   Neutrophils Relative % 87 %   Neutro Abs 12.5 (H) 1.7 - 7.7 K/uL   Lymphocytes Relative 8 %   Lymphs Abs 1.1 0.7 - 4.0 K/uL   Monocytes Relative 3 %   Monocytes Absolute 0.4 0.1 - 1.0 K/uL   Eosinophils Relative 0 %   Eosinophils Absolute 0.0 0.0 - 0.5 K/uL   Basophils Relative 0 %   Basophils Absolute 0.0 0.0 - 0.1 K/uL   Immature Granulocytes 2 %    Abs Immature Granulocytes 0.21 (H) 0.00 - 0.07 K/uL  Comprehensive metabolic panel     Status: Abnormal   Collection Time: 04/29/19  6:16 AM  Result Value Ref Range   Sodium 142 135 - 145 mmol/L   Potassium 3.7 3.5 - 5.1 mmol/L   Chloride 101 98 - 111 mmol/L   CO2 30 22 - 32 mmol/L   Glucose, Bld 151 (H) 70 - 99 mg/dL   BUN 17 8 - 23 mg/dL   Creatinine, Ser 0.78 0.44 - 1.00 mg/dL   Calcium 9.7 8.9 - 10.3 mg/dL   Total  Protein 8.3 (H) 6.5 - 8.1 g/dL   Albumin 3.7 3.5 - 5.0 g/dL   AST 21 15 - 41 U/L   ALT 16 0 - 44 U/L   Alkaline Phosphatase 94 38 - 126 U/L   Total Bilirubin 0.3 0.3 - 1.2 mg/dL   GFR calc non Af Amer >60 >60 mL/min   GFR calc Af Amer >60 >60 mL/min   Anion gap 11 5 - 15  Lipase, blood     Status: Abnormal   Collection Time: 04/29/19  6:16 AM  Result Value Ref Range   Lipase 201 (H) 11 - 51 U/L  Urinalysis, Routine w reflex microscopic     Status: Abnormal   Collection Time: 04/29/19  6:34 AM  Result Value Ref Range   Color, Urine YELLOW YELLOW   APPearance CLOUDY (A) CLEAR   Specific Gravity, Urine 1.014 1.005 - 1.030   pH 7.0 5.0 - 8.0   Glucose, UA NEGATIVE NEGATIVE mg/dL   Hgb urine dipstick NEGATIVE NEGATIVE   Bilirubin Urine NEGATIVE NEGATIVE   Ketones, ur NEGATIVE NEGATIVE mg/dL   Protein, ur NEGATIVE NEGATIVE mg/dL   Nitrite NEGATIVE NEGATIVE   Leukocytes,Ua NEGATIVE NEGATIVE  Respiratory Panel by RT PCR (Flu A&B, Covid) - Nasopharyngeal Swab     Status: None   Collection Time: 04/29/19  7:13 AM   Specimen: Nasopharyngeal Swab  Result Value Ref Range   SARS Coronavirus 2 by RT PCR NEGATIVE NEGATIVE   Influenza A by PCR NEGATIVE NEGATIVE   Influenza B by PCR NEGATIVE NEGATIVE  CBC     Status: Abnormal   Collection Time: 04/29/19  2:45 PM  Result Value Ref Range   WBC 12.9 (H) 4.0 - 10.5 K/uL   RBC 5.52 (H) 3.87 - 5.11 MIL/uL   Hemoglobin 14.6 12.0 - 15.0 g/dL   HCT 50.1 (H) 36.0 - 46.0 %   MCV 90.8 80.0 - 100.0 fL   MCH 26.4 26.0 - 34.0 pg    MCHC 29.1 (L) 30.0 - 36.0 g/dL   RDW 19.9 (H) 11.5 - 15.5 %   Platelets 200 150 - 400 K/uL   nRBC 0.0 0.0 - 0.2 %  Creatinine, serum     Status: None   Collection Time: 04/29/19  2:45 PM  Result Value Ref Range   Creatinine, Ser 0.74 0.44 - 1.00 mg/dL   GFR calc non Af Amer >60 >60 mL/min   GFR calc Af Amer >60 >60 mL/min   Imaging Studies: DG Chest 2 View  Result Date: 04/29/2019 CLINICAL DATA:  Sudden onset mid chest pain EXAM: CHEST - 2 VIEW COMPARISON:  09/17/2015 FINDINGS: Normal heart size and mediastinal contours. No acute infiltrate or edema. No effusion or pneumothorax. No acute osseous findings. Degenerative endplate spurring. Artifact from EKG leads. IMPRESSION: No active cardiopulmonary disease. Electronically Signed   By: Monte Fantasia M.D.   On: 04/29/2019 05:47   US Abdomen Complete  Result Date: 04/29/2019 CLINICAL DATA:  Abdominal pain since yesterday EXAM: ABDOMEN ULTRASOUND COMPLETE COMPARISON:  Abdominal CT 09/25/2011 FINDINGS: Gallbladder: Shadowing and layering gallstones. The gallbladder is distended there is focal tenderness per sonographer report, but no wall thickening or pericholecystic edema Common bile duct: Diameter: 3 mm Liver: No focal lesion identified. Within normal limits in parenchymal echogenicity. Portal vein is patent on color Doppler imaging with normal direction of blood flow towards the liver. IVC: No abnormality visualized. Pancreas: Visualized portion unremarkable. Spleen: Size and appearance within normal limits. Right Kidney: Length: 12 cm.  Echogenicity within normal limits. No mass or hydronephrosis visualized. Left Kidney: Length: 11 cm. Echogenicity within normal limits. No mass or hydronephrosis visualized. Abdominal aorta: No aneurysm visualized. Incomplete coverage due to shadowing bowel gas. IMPRESSION: Cholelithiasis and gallbladder tenderness but no wall thickening or right upper quadrant fluid typical of acute cholecystitis.  Electronically Signed   By: Monte Fantasia M.D.   On: 04/29/2019 07:05    ED COURSE and MDM  Nursing notes, initial and subsequent vitals signs, including pulse oximetry, reviewed and interpreted by myself.  Vitals:   04/29/19 1803 04/29/19 1902 04/29/19 2223 04/30/19 0150  BP: (!) 150/84 (!) 145/78 125/70 105/68  Pulse: 64 74 88 69  Resp: 16 16 16 16   Temp: 98.1 F (36.7 C)  98.6 F (37 C) 99.1 F (37.3 C)  TempSrc: Oral  Oral Oral  SpO2: 99% 100% 99% 98%  Weight:      Height:       Medications  sodium chloride flush (NS) 0.9 % injection 3 mL (3 mLs Intravenous Given 04/29/19 0645)  ondansetron (ZOFRAN) injection 4 mg (4 mg Intravenous Given 04/29/19 0642)  sodium chloride 0.9 % bolus 1,000 mL (1,000 mLs Intravenous New Bag/Given 04/29/19 0642)  fentaNYL (SUBLIMAZE) injection 100 mcg (100 mcg Intravenous Given 04/29/19 [id number])   7:21 AM Dr. Kieth Brightly of general surgery consulted to evaluate patient.  Despite no wall thickening or pericholecystic fluid she does have a positive sonographic Murphy sign and persistent pain and elevated lipase.   PROCEDURES  Procedures   ED DIAGNOSES     ICD-10-CM   1. Acute calculous cholecystitis  K80.00   2. Acute biliary pancreatitis without infection or necrosis  K85.10        Raymonda Pell, MD 04/30/19 0320    Shanon Rosser, MD 04/30/19 [phone]

## 2019-04-29 NOTE — ED Notes (Signed)
Electronic consent obtained and witnessed by this RN.

## 2019-04-30 DIAGNOSIS — Z87891 Personal history of nicotine dependence: Secondary | ICD-10-CM | POA: Diagnosis not present

## 2019-04-30 DIAGNOSIS — M1990 Unspecified osteoarthritis, unspecified site: Secondary | ICD-10-CM | POA: Diagnosis not present

## 2019-04-30 DIAGNOSIS — Z888 Allergy status to other drugs, medicaments and biological substances status: Secondary | ICD-10-CM | POA: Diagnosis not present

## 2019-04-30 DIAGNOSIS — Z7982 Long term (current) use of aspirin: Secondary | ICD-10-CM | POA: Diagnosis not present

## 2019-04-30 DIAGNOSIS — Z20822 Contact with and (suspected) exposure to covid-19: Secondary | ICD-10-CM | POA: Diagnosis not present

## 2019-04-30 DIAGNOSIS — Z79891 Long term (current) use of opiate analgesic: Secondary | ICD-10-CM | POA: Diagnosis not present

## 2019-04-30 DIAGNOSIS — B009 Herpesviral infection, unspecified: Secondary | ICD-10-CM | POA: Diagnosis not present

## 2019-04-30 DIAGNOSIS — Z8249 Family history of ischemic heart disease and other diseases of the circulatory system: Secondary | ICD-10-CM | POA: Diagnosis not present

## 2019-04-30 DIAGNOSIS — K851 Biliary acute pancreatitis without necrosis or infection: Secondary | ICD-10-CM | POA: Diagnosis not present

## 2019-04-30 DIAGNOSIS — Z79899 Other long term (current) drug therapy: Secondary | ICD-10-CM | POA: Diagnosis not present

## 2019-04-30 DIAGNOSIS — Z8261 Family history of arthritis: Secondary | ICD-10-CM | POA: Diagnosis not present

## 2019-04-30 DIAGNOSIS — I10 Essential (primary) hypertension: Secondary | ICD-10-CM | POA: Diagnosis not present

## 2019-04-30 DIAGNOSIS — Z6841 Body Mass Index (BMI) 40.0 and over, adult: Secondary | ICD-10-CM | POA: Diagnosis not present

## 2019-04-30 DIAGNOSIS — K8000 Calculus of gallbladder with acute cholecystitis without obstruction: Secondary | ICD-10-CM | POA: Diagnosis not present

## 2019-04-30 DIAGNOSIS — J45909 Unspecified asthma, uncomplicated: Secondary | ICD-10-CM | POA: Diagnosis not present

## 2019-04-30 LAB — GLUCOSE, CAPILLARY: Glucose-Capillary: 84 mg/dL (ref 70–99)

## 2019-04-30 LAB — CBC
HCT: 37.9 % (ref 36.0–46.0)
Hemoglobin: 11.3 g/dL — ABNORMAL LOW (ref 12.0–15.0)
MCH: 26 pg (ref 26.0–34.0)
MCHC: 29.8 g/dL — ABNORMAL LOW (ref 30.0–36.0)
MCV: 87.1 fL (ref 80.0–100.0)
Platelets: 208 10*3/uL (ref 150–400)
RBC: 4.35 MIL/uL (ref 3.87–5.11)
RDW: 18.6 % — ABNORMAL HIGH (ref 11.5–15.5)
WBC: 11.8 10*3/uL — ABNORMAL HIGH (ref 4.0–10.5)
nRBC: 0 % (ref 0.0–0.2)

## 2019-04-30 LAB — COMPREHENSIVE METABOLIC PANEL
ALT: 52 U/L — ABNORMAL HIGH (ref 0–44)
AST: 58 U/L — ABNORMAL HIGH (ref 15–41)
Albumin: 3.1 g/dL — ABNORMAL LOW (ref 3.5–5.0)
Alkaline Phosphatase: 71 U/L (ref 38–126)
Anion gap: 9 (ref 5–15)
BUN: 19 mg/dL (ref 8–23)
CO2: 26 mmol/L (ref 22–32)
Calcium: 8.6 mg/dL — ABNORMAL LOW (ref 8.9–10.3)
Chloride: 105 mmol/L (ref 98–111)
Creatinine, Ser: 0.84 mg/dL (ref 0.44–1.00)
GFR calc Af Amer: >60 mL/min (ref >60)
GFR calc non Af Amer: >60 mL/min (ref >60)
Glucose, Bld: 107 mg/dL — ABNORMAL HIGH (ref 70–99)
Potassium: 3.7 mmol/L (ref 3.5–5.1)
Sodium: 140 mmol/L (ref 135–145)
Total Bilirubin: 0.6 mg/dL (ref 0.3–1.2)
Total Protein: 6.6 g/dL (ref 6.5–8.1)

## 2019-04-30 MED ORDER — BISACODYL 10 MG RE SUPP: 10.0000 mg | Freq: Every day | RECTAL | Status: DC | PRN

## 2019-04-30 MED ORDER — DOCUSATE SODIUM 100 MG PO CAPS
100.0000 mg | ORAL_CAPSULE | Freq: Two times a day (BID) | ORAL | Status: DC
  Administered 2019-04-30 – 2019-05-01 (×3): 100 mg via ORAL
  Filled 2019-04-30 (×3): qty 1

## 2019-04-30 NOTE — Progress Notes (Signed)
1 Day Post-Op   Subjective/Chief Complaint: Complains of feeling light headed when she gets up. BP and hg look good   Objective: Vital signs in last 24 hours: Temp:  [97.4 F (36.3 C)-99.1 F (37.3 C)] 98.4 F (36.9 C) (01/16 0514) Pulse Rate:  [59-89] 66 (01/16 0514) Resp:  [12-31] 16 (01/16 0514) BP: (105-192)/(68-175) 131/79 (01/16 0514) SpO2:  [95 %-100 %] 100 % (01/16 0514) Weight:  [135.6 kg] 135.6 kg (01/15 1043) Last BM Date: 04/26/19  Intake/Output from previous day: 01/15 0701 - 01/16 0700 In: 3792.5 [P.O.:720; I.V.:1872.5; IV Piggyback:1200] Out: 670 [Urine:650; Blood:20] Intake/Output this shift: No intake/output data recorded.  General appearance: alert and cooperative Resp: clear to auscultation bilaterally Cardio: regular rate and rhythm GI: soft, mild tenderness on right  Lab Results:  Recent Labs    04/29/19 1445 04/30/19 0512  WBC 12.9* 11.8*  HGB 14.6 11.3*  HCT 50.1* 37.9  PLT 200 208   BMET Recent Labs    04/29/19 0616 04/29/19 0616 04/29/19 1445 04/30/19 0512  NA 142  --   --  140  K 3.7  --   --  3.7  CL 101  --   --  105  CO2 30  --   --  26  GLUCOSE 151*  --   --  107*  BUN 17  --   --  19  CREATININE 0.78   < > 0.74 0.84  CALCIUM 9.7  --   --  8.6*   < > = values in this interval not displayed.   PT/INR No results for input(s): LABPROT, INR in the last 72 hours. ABG No results for input(s): PHART, HCO3 in the last 72 hours.  Invalid input(s): PCO2, PO2  Studies/Results: DG Chest 2 View  Result Date: 04/29/2019 CLINICAL DATA:  Sudden onset mid chest pain EXAM: CHEST - 2 VIEW COMPARISON:  09/17/2015 FINDINGS: Normal heart size and mediastinal contours. No acute infiltrate or edema. No effusion or pneumothorax. No acute osseous findings. Degenerative endplate spurring. Artifact from EKG leads. IMPRESSION: No active cardiopulmonary disease. Electronically Signed   By: Monte Fantasia M.D.   On: 04/29/2019 05:47   US Abdomen  Complete  Result Date: 04/29/2019 CLINICAL DATA:  Abdominal pain since yesterday EXAM: ABDOMEN ULTRASOUND COMPLETE COMPARISON:  Abdominal CT 09/25/2011 FINDINGS: Gallbladder: Shadowing and layering gallstones. The gallbladder is distended there is focal tenderness per sonographer report, but no wall thickening or pericholecystic edema Common bile duct: Diameter: 3 mm Liver: No focal lesion identified. Within normal limits in parenchymal echogenicity. Portal vein is patent on color Doppler imaging with normal direction of blood flow towards the liver. IVC: No abnormality visualized. Pancreas: Visualized portion unremarkable. Spleen: Size and appearance within normal limits. Right Kidney: Length: 12 cm. Echogenicity within normal limits. No mass or hydronephrosis visualized. Left Kidney: Length: 11 cm. Echogenicity within normal limits. No mass or hydronephrosis visualized. Abdominal aorta: No aneurysm visualized. Incomplete coverage due to shadowing bowel gas. IMPRESSION: Cholelithiasis and gallbladder tenderness but no wall thickening or right upper quadrant fluid typical of acute cholecystitis. Electronically Signed   By: Monte Fantasia M.D.   On: 04/29/2019 07:05    Anti-infectives: Anti-infectives (From admission, onward)   Start     Dose/Rate Route Frequency Ordered Stop   04/29/19 0900  cefTRIAXone (ROCEPHIN) 2 g in sodium chloride 0.9 % 100 mL IVPB  Status:  Discontinued     2 g 200 mL/hr over 30 Minutes Intravenous Every 24 hours 04/29/19 0846  04/29/19 1627      Assessment/Plan: s/p Procedure(s): LAPAROSCOPIC CHOLECYSTECTOMY (N/A) Advance diet  Continue to monitor BP Ambulate Hopefully will be ready for d/c tomorrow  LOS: 0 days    Linda Cabrera 04/30/2019

## 2019-05-01 DIAGNOSIS — Z20822 Contact with and (suspected) exposure to covid-19: Secondary | ICD-10-CM | POA: Diagnosis not present

## 2019-05-01 DIAGNOSIS — K8000 Calculus of gallbladder with acute cholecystitis without obstruction: Secondary | ICD-10-CM | POA: Diagnosis not present

## 2019-05-01 DIAGNOSIS — K82A1 Gangrene of gallbladder in cholecystitis: Secondary | ICD-10-CM | POA: Diagnosis not present

## 2019-05-01 DIAGNOSIS — I10 Essential (primary) hypertension: Secondary | ICD-10-CM | POA: Diagnosis not present

## 2019-05-01 LAB — TROPONIN I (HIGH SENSITIVITY): Troponin I (High Sensitivity): 8 ng/L (ref <18)

## 2019-05-01 LAB — GLUCOSE, CAPILLARY: Glucose-Capillary: 87 mg/dL (ref 70–99)

## 2019-05-01 MED ORDER — ALUM & MAG HYDROXIDE-SIMETH 200-200-20 MG/5ML PO SUSP
30.0000 mL | Freq: Four times a day (QID) | ORAL | Status: DC | PRN
  Administered 2019-05-01 (×2): 30 mL via ORAL
  Filled 2019-05-01 (×2): qty 30

## 2019-05-01 NOTE — Anesthesia Postprocedure Evaluation (Signed)
Anesthesia Post Note  Patient: Linda Cabrera  Procedure(s) Performed: LAPAROSCOPIC CHOLECYSTECTOMY (N/A Abdomen)     Patient location during evaluation: PACU Anesthesia Type: General Level of consciousness: awake and alert Pain management: pain level controlled Vital Signs Assessment: post-procedure vital signs reviewed and stable Respiratory status: spontaneous breathing, nonlabored ventilation, respiratory function stable and patient connected to nasal cannula oxygen Cardiovascular status: blood pressure returned to baseline and stable Postop Assessment: no apparent nausea or vomiting Anesthetic complications: no    Last Vitals:  Vitals:   05/01/19 0706 05/01/19 0955  BP: (!) 146/77 138/77  Pulse: 91 97  Resp: 18 16  Temp: 37.2 C 37.2 C  SpO2: 94% (!) 85%    Last Pain:  Vitals:   05/01/19 0955  TempSrc: Oral  PainSc:                  Linda Cabrera Linda Cabrera

## 2019-05-01 NOTE — Progress Notes (Signed)
2 Days Post-Op   Subjective/Chief Complaint: Complains of chest pain. Not sure if it is reflux   Objective: Vital signs in last 24 hours: Temp:  [98.3 F (36.8 C)-99.6 F (37.6 C)] 98.9 F (37.2 C) (01/17 0706) Pulse Rate:  [74-93] 91 (01/17 0706) Resp:  [16-20] 18 (01/17 0706) BP: (111-146)/(61-77) 146/77 (01/17 0706) SpO2:  [89 %-98 %] 94 % (01/17 0706) Last BM Date: 04/26/19  Intake/Output from previous day: 01/16 0701 - 01/17 0700 In: 540 [P.O.:540] Out: 1400 [Urine:1400] Intake/Output this shift: Total I/O In: -  Out: 200 [Urine:200]  General appearance: alert and cooperative Resp: clear to auscultation bilaterally Cardio: regular rate and rhythm GI: soft, minimal tenderness  Lab Results:  Recent Labs    04/29/19 1445 04/30/19 0512  WBC 12.9* 11.8*  HGB 14.6 11.3*  HCT 50.1* 37.9  PLT 200 208   BMET Recent Labs    04/29/19 0616 04/29/19 0616 04/29/19 1445 04/30/19 0512  NA 142  --   --  140  K 3.7  --   --  3.7  CL 101  --   --  105  CO2 30  --   --  26  GLUCOSE 151*  --   --  107*  BUN 17  --   --  19  CREATININE 0.78   < > 0.74 0.84  CALCIUM 9.7  --   --  8.6*   < > = values in this interval not displayed.   PT/INR No results for input(s): LABPROT, INR in the last 72 hours. ABG No results for input(s): PHART, HCO3 in the last 72 hours.  Invalid input(s): PCO2, PO2  Studies/Results: No results found.  Anti-infectives: Anti-infectives (From admission, onward)   Start     Dose/Rate Route Frequency Ordered Stop   04/29/19 0900  cefTRIAXone (ROCEPHIN) 2 g in sodium chloride 0.9 % 100 mL IVPB  Status:  Discontinued     2 g 200 mL/hr over 30 Minutes Intravenous Every 24 hours 04/29/19 0846 04/29/19 1627      Assessment/Plan: s/p Procedure(s): LAPAROSCOPIC CHOLECYSTECTOMY (N/A) Advance diet  Will check ekg and troponin. If normal then she can be d/c today  LOS: 1 day    Autumn Messing III 05/01/2019

## 2019-05-02 ENCOUNTER — Telehealth: Payer: Self-pay | Admitting: *Deleted

## 2019-05-02 LAB — SURGICAL PATHOLOGY

## 2019-05-02 NOTE — Telephone Encounter (Signed)
Pt was on TCM report admitted 04/29/19 to ober servation for Chest pain increases with movement, palpation or deep inhalation nausea and vomiting, abdominal pain. Pt had Covid test which was negative.  Chest x-ray was normal.  Abdominal ultrasound shows gallbladder with shadowing and layering gallstones gallbladder is distended there is focal tenderness but no wall thickening or pericholecystic edema.  Pt underwent  laparoscopic cholecystectomy and D/C 05/01/19. Pt will follow-up w/surgeon in 1-2 weeks.Marland KitchenJohny Chess

## 2019-05-03 NOTE — Discharge Summary (Signed)
Physician Discharge Summary  Patient ID: Linda Cabrera MRN: [id number] DOB/AGE: 63-Apr-1957 63 y.o.  Admit date: 04/29/2019 Discharge date: 05/01/2019  Admission Diagnoses:  Acute cholecystitis, cholelithiasis, mild pancreatitis Hx hypertension Hx of herpes Hx positive H. pylori 02/13/2014 Morbid obesity Hx anemia Hx arthritis  Discharge Diagnoses:  Same   Active Problems:   Acute cholecystitis   Acute calculous cholecystitis   PROCEDURES: Laparoscopic cholecystectomy 04/29/2019 Dr. Lurena Joiner [facility] Course:  Patient is a 64 year old female presents with right upper quadrant pain going to her right flank which started yesterday afternoon around 5 PM.  She describes the pain is sharp and rates it 10/10.  Pain was worse with movement palpation and deep breathing she has had nausea and vomiting but no fever.  She was not short of breath.  Work-up in the ED she is afebrile blood pressure is elevated 153/104, heart rate was eighty-eight, sats are 98% on room air.  Labs show a glucose of one fifty-one, lipase of two oh one, AST twenty-one, ALT of sixteen, total protein 8.3, total bilirubin 0.3.  WBC 14.3, hemoglobin 13.9, hematocrit 44.7, platelets a hundred thirty-eight thousand. Covid test 2 weeks ago was negative.  Chest x-ray was normal.  Abdominal ultrasound shows gallbladder with shadowing and layering gallstones gallbladder is distended there is focal tenderness but no wall thickening or pericholecystic edema.  CBD is 3 mm.  We are asked to see. Covid test is also negative this AM. Patient was seen in the emergency department and admitted then taken to the operating room later that day.  Went laparoscopic cholecystectomy without any issue.  She remained stable overnight but had some orthostatic type dizziness on standing on the first postoperative day.  Again complained of chest pain the second postoperative day and EKG and troponins were obtained.  These were normal patient was  subsequently discharged by Dr. Marlou Starks.  CBC Latest Ref Rng & Units 04/30/2019 04/29/2019 04/29/2019  WBC 4.0 - 10.5 K/uL 11.8(H) 12.9(H) 14.3(H)  Hemoglobin 12.0 - 15.0 g/dL 11.3(L) 14.6 13.9  Hematocrit 36.0 - 46.0 % 37.9 50.1(H) 44.7  Platelets 150 - 400 K/uL 208 200 238   CMP Latest Ref Rng & Units 04/30/2019 04/29/2019 04/29/2019  Glucose 70 - 99 mg/dL 107(H) - 151(H)  BUN 8 - 23 mg/dL 19 - 17  Creatinine 0.44 - 1.00 mg/dL 0.84 0.74 0.78  Sodium 135 - 145 mmol/L 140 - 142  Potassium 3.5 - 5.1 mmol/L 3.7 - 3.7  Chloride 98 - 111 mmol/L 105 - 101  CO2 22 - 32 mmol/L 26 - 30  Calcium 8.9 - 10.3 mg/dL 8.6(L) - 9.7  Total Protein 6.5 - 8.1 g/dL 6.6 - 8.3(H)  Total Bilirubin 0.3 - 1.2 mg/dL 0.6 - 0.3  Alkaline Phos 38 - 126 U/L 71 - 94  AST 15 - 41 U/L 58(H) - 21  ALT 0 - 44 U/L 52(H) - 16   Troponin (Point of Care Test) No results for input(s): TROPIPOC in the last 72 hours.  Troponin I (High Sensitivity) Status:  Final result  Ref Range & Units 05/01/19 0905  Troponin I (High Sensitivity) <18 ng/L 8   Comment: (NOTE)  Elevated high sensitivity troponin I (hsTnI) values and significant  changes across serial measurements may suggest ACS but many other  chronic and acute conditions are known to elevate hsTnI results.  Refer to the "Links" section for chest pain algorithms and additional  guidance.  Performed at [facility], [place] [facility]  [address]  [place], [place] [address]   Resulting Agency  [facility]      Specimen Collected: 05/01/19 [id number] Last Resulted: 05/01/19 0950 View Other Order Details      Condition on discharge: Improved.   Disposition: Discharge disposition: 01-Home or Self Care       Discharge Instructions    MyChart COVID-19 home monitoring program   Complete by: May 01, 2019    Is the patient willing to use the [facility] for home monitoring?: Yes   Temperature monitoring   Complete by: May 01, 2019    After how many days would  you like to receive a notification of this patient's flowsheet entries?: 1   Call MD for:  difficulty breathing, headache or visual disturbances   Complete by: As directed    Call MD for:  extreme fatigue   Complete by: As directed    Call MD for:  hives   Complete by: As directed    Call MD for:  persistant dizziness or light-headedness   Complete by: As directed    Call MD for:  persistant nausea and vomiting   Complete by: As directed    Call MD for:  redness, tenderness, or signs of infection (pain, swelling, redness, odor or green/yellow discharge around incision site)   Complete by: As directed    Call MD for:  severe uncontrolled pain   Complete by: As directed    Call MD for:  temperature >100.4   Complete by: As directed    Diet - low sodium heart healthy   Complete by: As directed    Discharge instructions   Complete by: As directed    May shower. Low fat diet. No heavy lifting   Increase activity slowly   Complete by: As directed    No wound care   Complete by: As directed      Allergies as of 05/01/2019      Reactions   Ace Inhibitors Swelling      Medication List    STOP taking these medications   benzonatate 200 MG capsule Commonly known as: TESSALON   diclofenac 75 MG EC tablet Commonly known as: VOLTAREN   Phentermine-Topiramate 3.75-23 MG Cp24 Commonly known as: Qsymia     TAKE these medications   albuterol 108 (90 Base) MCG/ACT inhaler Commonly known as: Ventolin HFA INHALE TWO PUFFS INTO THE LUNGS EVERY 6 HOURS AS NEEDED FOR WHEEZING OR SHORTNESS OF BREATH What changed:   how much to take  how to take this  when to take this  reasons to take this  additional instructions   aspirin EC 81 MG tablet Take 1 tablet (81 mg total) by mouth daily.   furosemide 40 MG tablet Commonly known as: LASIX TAKE ONE TABLET (40 MG) BY MOUTH EVERY DAY What changed:   See the new instructions.  Another medication with the same name was removed.  Continue taking this medication, and follow the directions you see here.   montelukast 10 MG tablet Commonly known as: SINGULAIR TAKE ONE TABLET BY MOUTH AT BEDTIME   oxyCODONE 5 MG immediate release tablet Commonly known as: Roxicodone Take 1 tablet (5 mg total) by mouth every 6 (six) hours as needed for breakthrough pain.   valACYclovir 1000 MG tablet Commonly known as: VALTREX Take 1 tablet (1,000 mg total) by mouth daily. **OFFICE VISIT IS DUE** What changed: additional instructions      Follow-up Information    [facility], [facility]. Call  on 05/17/2019.   Specialty: General Surgery Why: 2/02 at 11:15 am. This will be a televisit. Our office will call you at the time of your appointment. Please email a photo of you incisions to [email or web address] the day before your appointment  Contact information: [place] [address] [phone]           Signed: Earnstine Regal 05/03/2019, 11:58 AM

## 2019-05-06 ENCOUNTER — Other Ambulatory Visit: Payer: Self-pay | Admitting: Internal Medicine

## 2019-05-07 ENCOUNTER — Other Ambulatory Visit: Payer: Self-pay | Admitting: Internal Medicine

## 2019-05-07 ENCOUNTER — Encounter (INDEPENDENT_AMBULATORY_CARE_PROVIDER_SITE_OTHER): Payer: Self-pay

## 2019-05-08 ENCOUNTER — Encounter (INDEPENDENT_AMBULATORY_CARE_PROVIDER_SITE_OTHER): Payer: Self-pay

## 2019-05-12 ENCOUNTER — Other Ambulatory Visit: Payer: Self-pay

## 2019-05-12 ENCOUNTER — Ambulatory Visit: Payer: 59 | Admitting: Internal Medicine

## 2019-05-12 ENCOUNTER — Telehealth: Payer: Self-pay

## 2019-05-12 MED ORDER — FUROSEMIDE 40 MG PO TABS: ORAL_TABLET | ORAL | 0 refills | Status: DC

## 2019-05-12 NOTE — Telephone Encounter (Signed)
Pt came for appt today that was canceled. Pt resched to 05/19/19. Pt request: REFILL: Lasix  [facility] [address]

## 2019-05-13 ENCOUNTER — Encounter (INDEPENDENT_AMBULATORY_CARE_PROVIDER_SITE_OTHER): Payer: Self-pay

## 2019-05-14 ENCOUNTER — Encounter (INDEPENDENT_AMBULATORY_CARE_PROVIDER_SITE_OTHER): Payer: Self-pay

## 2019-05-19 ENCOUNTER — Encounter: Payer: Self-pay | Admitting: Internal Medicine

## 2019-05-19 ENCOUNTER — Ambulatory Visit (INDEPENDENT_AMBULATORY_CARE_PROVIDER_SITE_OTHER): Payer: 59 | Admitting: Internal Medicine

## 2019-05-19 ENCOUNTER — Other Ambulatory Visit: Payer: Self-pay

## 2019-05-19 VITALS — BP 134/82 | HR 75 | Temp 98.7°F | Ht 63.0 in | Wt 298.5 lb

## 2019-05-19 DIAGNOSIS — J4520 Mild intermittent asthma, uncomplicated: Secondary | ICD-10-CM | POA: Diagnosis not present

## 2019-05-19 DIAGNOSIS — Z0000 Encounter for general adult medical examination without abnormal findings: Secondary | ICD-10-CM

## 2019-05-19 DIAGNOSIS — B009 Herpesviral infection, unspecified: Secondary | ICD-10-CM | POA: Diagnosis not present

## 2019-05-19 DIAGNOSIS — I10 Essential (primary) hypertension: Secondary | ICD-10-CM

## 2019-05-19 DIAGNOSIS — R7303 Prediabetes: Secondary | ICD-10-CM

## 2019-05-19 LAB — COMPREHENSIVE METABOLIC PANEL
ALT: 14 U/L (ref 0–35)
AST: 14 U/L (ref 0–37)
Albumin: 3.7 g/dL (ref 3.5–5.2)
Alkaline Phosphatase: 92 U/L (ref 39–117)
BUN: 18 mg/dL (ref 6–23)
CO2: 31 mEq/L (ref 19–32)
Calcium: 9 mg/dL (ref 8.4–10.5)
Chloride: 105 mEq/L (ref 96–112)
Creatinine, Ser: 0.8 mg/dL (ref 0.40–1.20)
GFR: 87.9 mL/min (ref >60.00)
Glucose, Bld: 83 mg/dL (ref 70–99)
Potassium: 3.8 mEq/L (ref 3.5–5.1)
Sodium: 141 mEq/L (ref 135–145)
Total Bilirubin: 0.3 mg/dL (ref 0.2–1.2)
Total Protein: 7.2 g/dL (ref 6.0–8.3)

## 2019-05-19 LAB — LIPID PANEL
Cholesterol: 162 mg/dL (ref 0–200)
HDL: 61.9 mg/dL (ref >39.00)
LDL Cholesterol: 86 mg/dL (ref 0–99)
NonHDL: 99.73
Total CHOL/HDL Ratio: 3
Triglycerides: 71 mg/dL (ref 0.0–149.0)
VLDL: 14.2 mg/dL (ref 0.0–40.0)

## 2019-05-19 LAB — HEMOGLOBIN A1C: Hgb A1c MFr Bld: 6 % (ref 4.6–6.5)

## 2019-05-19 MED ORDER — VALACYCLOVIR HCL 1 G PO TABS: 1000.0000 mg | ORAL_TABLET | Freq: Every day | ORAL | 3 refills | Status: DC

## 2019-05-19 MED ORDER — FUROSEMIDE 40 MG PO TABS: 40.0000 mg | ORAL_TABLET | Freq: Every day | ORAL | 3 refills | Status: DC

## 2019-05-19 NOTE — Assessment & Plan Note (Signed)
Flu shot up to date. Shingrix counseled. Tetanus up to date. Colonoscopy up to date. Mammogram up to date, pap smear up to date. Counseled about sun safety and mole surveillance. Counseled about the dangers of distracted driving. Given 10 year screening recommendations.

## 2019-05-19 NOTE — Assessment & Plan Note (Signed)
Refill valtrex to take daily for suppressive therapy.

## 2019-05-19 NOTE — Assessment & Plan Note (Signed)
Takes lasix daily. Checking CMP and adjust as needed.

## 2019-05-19 NOTE — Assessment & Plan Note (Signed)
Given information about qsymia and contrave as options for weight loss. She previously has been on phentermine but this is not indicated for long term usage.

## 2019-05-19 NOTE — Patient Instructions (Addendum)
Qsymia or contrave are the two options for the weight.    Health Maintenance, Female Adopting a healthy lifestyle and getting preventive care are important in promoting health and wellness. Ask your health care provider about:  The right schedule for you to have regular tests and exams.  Things you can do on your own to prevent diseases and keep yourself healthy. What should I know about diet, weight, and exercise? Eat a healthy diet   Eat a diet that includes plenty of vegetables, fruits, low-fat dairy products, and lean protein.  Do not eat a lot of foods that are high in solid fats, added sugars, or sodium. Maintain a healthy weight Body mass index (BMI) is used to identify weight problems. It estimates body fat based on height and weight. Your health care provider can help determine your BMI and help you achieve or maintain a healthy weight. Get regular exercise Get regular exercise. This is one of the most important things you can do for your health. Most adults should:  Exercise for at least 150 minutes each week. The exercise should increase your heart rate and make you sweat (moderate-intensity exercise).  Do strengthening exercises at least twice a week. This is in addition to the moderate-intensity exercise.  Spend less time sitting. Even light physical activity can be beneficial. Watch cholesterol and blood lipids Have your blood tested for lipids and cholesterol at 63 years of age, then have this test every 5 years. Have your cholesterol levels checked more often if:  Your lipid or cholesterol levels are high.  You are older than 63 years of age.  You are at high risk for heart disease. What should I know about cancer screening? Depending on your health history and family history, you may need to have cancer screening at various ages. This may include screening for:  Breast cancer.  Cervical cancer.  Colorectal cancer.  Skin cancer.  Lung cancer. What should  I know about heart disease, diabetes, and high blood pressure? Blood pressure and heart disease  High blood pressure causes heart disease and increases the risk of stroke. This is more likely to develop in people who have high blood pressure readings, are of African descent, or are overweight.  Have your blood pressure checked: ? Every 3-5 years if you are 73-58 years of age. ? Every year if you are 57 years old or older. Diabetes Have regular diabetes screenings. This checks your fasting blood sugar level. Have the screening done:  Once every three years after age 46 if you are at a normal weight and have a low risk for diabetes.  More often and at a younger age if you are overweight or have a high risk for diabetes. What should I know about preventing infection? Hepatitis B If you have a higher risk for hepatitis B, you should be screened for this virus. Talk with your health care provider to find out if you are at risk for hepatitis B infection. Hepatitis C Testing is recommended for:  Everyone born from [birth year] through 1964.  Anyone with known risk factors for hepatitis C. Sexually transmitted infections (STIs)  Get screened for STIs, including gonorrhea and chlamydia, if: ? You are sexually active and are younger than 63 years of age. ? You are older than 63 years of age and your health care provider tells you that you are at risk for this type of infection. ? Your sexual activity has changed since you were last screened, and you are  at increased risk for chlamydia or gonorrhea. Ask your health care provider if you are at risk.  Ask your health care provider about whether you are at high risk for HIV. Your health care provider may recommend a prescription medicine to help prevent HIV infection. If you choose to take medicine to prevent HIV, you should first get tested for HIV. You should then be tested every 3 months for as long as you are taking the medicine. Pregnancy  If you are  about to stop having your period (premenopausal) and you may become pregnant, seek counseling before you get pregnant.  Take 400 to 800 micrograms (mcg) of folic acid every day if you become pregnant.  Ask for birth control (contraception) if you want to prevent pregnancy. Osteoporosis and menopause Osteoporosis is a disease in which the bones lose minerals and strength with aging. This can result in bone fractures. If you are 76 years old or older, or if you are at risk for osteoporosis and fractures, ask your health care provider if you should:  Be screened for bone loss.  Take a calcium or vitamin D supplement to lower your risk of fractures.  Be given hormone replacement therapy (HRT) to treat symptoms of menopause. Follow these instructions at home: Lifestyle  Do not use any products that contain nicotine or tobacco, such as cigarettes, e-cigarettes, and chewing tobacco. If you need help quitting, ask your health care provider.  Do not use street drugs.  Do not share needles.  Ask your health care provider for help if you need support or information about quitting drugs. Alcohol use  Do not drink alcohol if: ? Your health care provider tells you not to drink. ? You are pregnant, may be pregnant, or are planning to become pregnant.  If you drink alcohol: ? Limit how much you use to 0-1 drink a day. ? Limit intake if you are breastfeeding.  Be aware of how much alcohol is in your drink. In the [place] one drink equals one 12 oz bottle of beer (355 mL), one 5 oz glass of wine (148 mL), or one 1 oz glass of hard liquor (44 mL). General instructions  Schedule regular health, dental, and eye exams.  Stay current with your vaccines.  Tell your health care provider if: ? You often feel depressed. ? You have ever been abused or do not feel safe at home. Summary  Adopting a healthy lifestyle and getting preventive care are important in promoting health and wellness.  Follow  your health care provider's instructions about healthy diet, exercising, and getting tested or screened for diseases.  Follow your health care provider's instructions on monitoring your cholesterol and blood pressure. This information is not intended to replace advice given to you by your health care provider. Make sure you discuss any questions you have with your health care provider. Document Revised: 03/24/2018 Document Reviewed: 03/24/2018 Elsevier Patient Education  2020 [employer].

## 2019-05-19 NOTE — Assessment & Plan Note (Signed)
Rare albuterol usage, no flare. Refill albuterol as needed.

## 2019-05-19 NOTE — Assessment & Plan Note (Signed)
Checking HgA1c and adjust as needed.  

## 2019-05-19 NOTE — Progress Notes (Signed)
   Subjective:   Patient ID: Linda Cabrera, female    DOB: November 02, 1955, 64 y.o.   MRN: [id number]  HPI The patient is a 64 YO female coming in for physical. Wants appetite reducing medication to help her lose weight.   PMH, [facility], social history reviewed and updated  Review of Systems  Constitutional: Negative.   HENT: Negative.   Eyes: Negative.   Respiratory: Negative for cough, chest tightness and shortness of breath.   Cardiovascular: Negative for chest pain, palpitations and leg swelling.  Gastrointestinal: Negative for abdominal distention, abdominal pain, constipation, diarrhea, nausea and vomiting.  Musculoskeletal: Negative.   Skin: Negative.   Neurological: Negative.   Psychiatric/Behavioral: Negative.     Objective:  Physical Exam Constitutional:      Appearance: She is well-developed. She is obese.  HENT:     Head: Normocephalic and atraumatic.  Cardiovascular:     Rate and Rhythm: Normal rate and regular rhythm.  Pulmonary:     Effort: Pulmonary effort is normal. No respiratory distress.     Breath sounds: Normal breath sounds. No wheezing or rales.  Abdominal:     General: Bowel sounds are normal. There is no distension.     Palpations: Abdomen is soft.     Tenderness: There is no abdominal tenderness. There is no rebound.  Musculoskeletal:     Cervical back: Normal range of motion.  Skin:    General: Skin is warm and dry.  Neurological:     Mental Status: She is alert and oriented to person, place, and time.     Coordination: Coordination normal.     Vitals:   05/19/19 1008  BP: 134/82  Pulse: 75  Temp: 98.7 F (37.1 C)  TempSrc: Oral  SpO2: 98%  Weight: 298 lb 8 oz (135.4 kg)  Height: 5\' 3"  (1.6 m)    This visit occurred during the SARS-CoV-2 public health emergency.  Safety protocols were in place, including screening questions prior to the visit, additional usage of staff PPE, and extensive cleaning of exam room while observing appropriate  contact time as indicated for disinfecting solutions.   Assessment & Plan:

## 2019-05-26 ENCOUNTER — Other Ambulatory Visit: Payer: Self-pay | Admitting: Internal Medicine

## 2019-05-26 DIAGNOSIS — Z1231 Encounter for screening mammogram for malignant neoplasm of breast: Secondary | ICD-10-CM

## 2019-05-28 ENCOUNTER — Encounter: Payer: Self-pay | Admitting: Internal Medicine

## 2019-07-05 ENCOUNTER — Other Ambulatory Visit: Payer: Self-pay

## 2019-07-05 ENCOUNTER — Ambulatory Visit
Admission: RE | Admit: 2019-07-05 | Discharge: 2019-07-05 | Disposition: A | Discharge status: Home / Self Care | Payer: 59 | Source: Ambulatory Visit | Attending: Internal Medicine | Admitting: Internal Medicine

## 2019-07-05 DIAGNOSIS — Z1231 Encounter for screening mammogram for malignant neoplasm of breast: Secondary | ICD-10-CM

## 2019-07-10 ENCOUNTER — Other Ambulatory Visit: Payer: Self-pay | Admitting: Internal Medicine

## 2019-07-11 ENCOUNTER — Telehealth: Payer: Self-pay

## 2019-07-11 MED ORDER — MONTELUKAST SODIUM 10 MG PO TABS: 10.0000 mg | ORAL_TABLET | Freq: Every day | ORAL | 3 refills | Status: DC

## 2019-07-11 MED ORDER — ASPIRIN EC 81 MG PO TBEC: 81.0000 mg | DELAYED_RELEASE_TABLET | Freq: Every day | ORAL | 11 refills | Status: AC

## 2019-07-11 NOTE — Telephone Encounter (Signed)
1.Medication Requested: albuterol (VENTOLIN HFA) 108 (90 Base) MCG/ACT inhaler  aspirin EC 81 MG tablet  furosemide (LASIX) 40 MG tablet  montelukast (SINGULAIR) 10 MG tablet  valACYclovir (VALTREX) 1000 MG tablet  2. Pharmacy (Name, Street, [address]): [id number] [place], [address]  3. On Med List: Yes   4. Last Visit with PCP: 2.4.21  5. Next visit date with PCP: no  appt is made at this time     Agent: Please be advised that RX refills may take up to 3 business days. We ask that you follow-up with your pharmacy.

## 2019-07-20 ENCOUNTER — Encounter: Payer: Self-pay | Admitting: Internal Medicine

## 2019-07-25 ENCOUNTER — Ambulatory Visit: Payer: 59 | Admitting: Internal Medicine

## 2019-08-19 ENCOUNTER — Ambulatory Visit (INDEPENDENT_AMBULATORY_CARE_PROVIDER_SITE_OTHER): Payer: 59 | Admitting: Internal Medicine

## 2019-08-19 ENCOUNTER — Encounter: Payer: Self-pay | Admitting: Internal Medicine

## 2019-08-19 ENCOUNTER — Other Ambulatory Visit: Payer: Self-pay

## 2019-08-19 MED ORDER — DOCUSATE SODIUM 100 MG PO CAPS: 100.0000 mg | ORAL_CAPSULE | Freq: Two times a day (BID) | ORAL | 3 refills | Status: DC

## 2019-08-19 NOTE — Patient Instructions (Signed)
We will get you set up with the surgeon to talk about weight loss options.

## 2019-08-19 NOTE — Assessment & Plan Note (Signed)
Referral to surgery for consideration of weight loss procedure which she wishes to pursue given severe OA pain.

## 2019-08-19 NOTE — Progress Notes (Signed)
   Subjective:   Patient ID: Linda Cabrera, female    DOB: [birth year], 64 y.o.   MRN: [id number]  HPI The patient is a 64 YO female coming in for concerns about weight. She is having a lot of joint pain and needs to lose weight to have the surgery. She has proceeded partially before with options for weight loss procedure and would like to pursue that again.   Review of Systems  Constitutional: Negative.   HENT: Negative.   Eyes: Negative.   Respiratory: Negative for cough, chest tightness and shortness of breath.   Cardiovascular: Negative for chest pain, palpitations and leg swelling.  Gastrointestinal: Negative for abdominal distention, abdominal pain, constipation, diarrhea, nausea and vomiting.  Musculoskeletal: Positive for arthralgias.  Skin: Negative.   Neurological: Negative.   Psychiatric/Behavioral: Negative.     Objective:  Physical Exam Constitutional:      Appearance: She is well-developed. She is obese.  HENT:     Head: Normocephalic and atraumatic.  Cardiovascular:     Rate and Rhythm: Normal rate and regular rhythm.  Pulmonary:     Effort: Pulmonary effort is normal. No respiratory distress.     Breath sounds: Normal breath sounds. No wheezing or rales.  Abdominal:     General: Bowel sounds are normal. There is no distension.     Palpations: Abdomen is soft.     Tenderness: There is no abdominal tenderness. There is no rebound.  Musculoskeletal:     Cervical back: Normal range of motion.  Skin:    General: Skin is warm and dry.  Neurological:     Mental Status: She is alert and oriented to person, place, and time.     Coordination: Coordination normal.     Vitals:   08/19/19 0943  BP: 128/82  Pulse: 70  Temp: 98.3 F (36.8 C)  TempSrc: Oral  SpO2: 99%  Weight: (!) 312 lb (141.5 kg)  Height: 5\' 3"  (1.6 m)    This visit occurred during the SARS-CoV-2 public health emergency.  Safety protocols were in place, including screening questions prior to  the visit, additional usage of staff PPE, and extensive cleaning of exam room while observing appropriate contact time as indicated for disinfecting solutions.   Assessment & Plan:

## 2020-01-10 ENCOUNTER — Other Ambulatory Visit: Payer: Self-pay | Admitting: Internal Medicine

## 2020-02-14 ENCOUNTER — Other Ambulatory Visit: Payer: Self-pay | Admitting: Internal Medicine

## 2020-04-03 ENCOUNTER — Other Ambulatory Visit: Payer: Self-pay | Admitting: Internal Medicine

## 2020-04-03 ENCOUNTER — Encounter: Payer: Self-pay | Admitting: Internal Medicine

## 2020-04-03 ENCOUNTER — Other Ambulatory Visit: Payer: Self-pay

## 2020-04-03 ENCOUNTER — Ambulatory Visit (INDEPENDENT_AMBULATORY_CARE_PROVIDER_SITE_OTHER): Payer: 59 | Admitting: Internal Medicine

## 2020-04-03 DIAGNOSIS — Z23 Encounter for immunization: Secondary | ICD-10-CM | POA: Diagnosis not present

## 2020-04-03 DIAGNOSIS — R7303 Prediabetes: Secondary | ICD-10-CM | POA: Diagnosis not present

## 2020-04-03 LAB — COMPREHENSIVE METABOLIC PANEL
ALT: 12 U/L (ref 0–35)
AST: 15 U/L (ref 0–37)
Albumin: 4 g/dL (ref 3.5–5.2)
Alkaline Phosphatase: 105 U/L (ref 39–117)
BUN: 15 mg/dL (ref 6–23)
CO2: 31 mEq/L (ref 19–32)
Calcium: 9.1 mg/dL (ref 8.4–10.5)
Chloride: 102 mEq/L (ref 96–112)
Creatinine, Ser: 0.86 mg/dL (ref 0.40–1.20)
GFR: 72.1 mL/min (ref >60.00)
Glucose, Bld: 89 mg/dL (ref 70–99)
Potassium: 3.6 mEq/L (ref 3.5–5.1)
Sodium: 140 mEq/L (ref 135–145)
Total Bilirubin: 0.5 mg/dL (ref 0.2–1.2)
Total Protein: 7.8 g/dL (ref 6.0–8.3)

## 2020-04-03 LAB — CBC
HCT: 42.7 % (ref 36.0–46.0)
Hemoglobin: 13.4 g/dL (ref 12.0–15.0)
MCHC: 31.5 g/dL (ref 30.0–36.0)
MCV: 81.3 fl (ref 78.0–100.0)
Platelets: 229 10*3/uL (ref 150.0–400.0)
RBC: 5.24 Mil/uL — ABNORMAL HIGH (ref 3.87–5.11)
RDW: 18.9 % — ABNORMAL HIGH (ref 11.5–15.5)
WBC: 8 10*3/uL (ref 4.0–10.5)

## 2020-04-03 LAB — VITAMIN B12: Vitamin B-12: 457 pg/mL (ref 211–911)

## 2020-04-03 LAB — HEMOGLOBIN A1C: Hgb A1c MFr Bld: 6 % (ref 4.6–6.5)

## 2020-04-03 LAB — VITAMIN D 25 HYDROXY (VIT D DEFICIENCY, FRACTURES): VITD: 16.99 ng/mL — ABNORMAL LOW (ref 30.00–100.00)

## 2020-04-03 LAB — TSH: TSH: 2.38 u[IU]/mL (ref 0.35–4.50)

## 2020-04-03 MED ORDER — CONTRAVE 8-90 MG PO TB12: ORAL_TABLET | ORAL | 0 refills | Status: DC

## 2020-04-03 NOTE — Patient Instructions (Addendum)
We have sent in contrave to try for weight loss.   Week 1 take 1 pill daily  Week 2 take 1 pill twice a day  Week 3 take 2 pills in the morning, 1 pill in the evening.  Week 4 and after take 2 pills in the morning and 2 pills in the evening.   We will get you in with the nutritionist.

## 2020-04-03 NOTE — Progress Notes (Signed)
   Subjective:   Patient ID: [name], female    DOB: 02/04/1956, 64 y.o.   MRN: [id number]  HPI The patient is a 64 YO female coming in for concerns about weight. Weight is up about 10-15 pounds since May 2021. She was referred to surgeon back in May to pursue weight loss options and she did talk to them and they wanted her to do something online to proceed and she did not understand this well. She is not sure she wants to do this at this time. She would like to try medication to help assist with weight loss.   Review of Systems  Constitutional: Positive for unexpected weight change.  HENT: Negative.   Eyes: Negative.   Respiratory: Negative for cough, chest tightness and shortness of breath.   Cardiovascular: Negative for chest pain, palpitations and leg swelling.  Gastrointestinal: Negative for abdominal distention, abdominal pain, constipation, diarrhea, nausea and vomiting.  Musculoskeletal: Negative.   Skin: Negative.   Neurological: Negative.   Psychiatric/Behavioral: Negative.     Objective:  Physical Exam Constitutional:      Appearance: She is well-developed and well-nourished. She is obese.  HENT:     Head: Normocephalic and atraumatic.  Eyes:     Extraocular Movements: EOM normal.  Cardiovascular:     Rate and Rhythm: Normal rate and regular rhythm.  Pulmonary:     Effort: Pulmonary effort is normal. No respiratory distress.     Breath sounds: Normal breath sounds. No wheezing or rales.  Abdominal:     General: Bowel sounds are normal. There is no distension.     Palpations: Abdomen is soft.     Tenderness: There is no abdominal tenderness. There is no rebound.  Musculoskeletal:        General: No edema.     Cervical back: Normal range of motion.  Skin:    General: Skin is warm and dry.  Neurological:     Mental Status: She is alert and oriented to person, place, and time.     Coordination: Coordination normal.  Psychiatric:        Mood and Affect: Mood  and affect normal.     Vitals:   04/03/20 0945  BP: 140/90  Pulse: 80  Weight: (!) 323 lb (146.5 kg)  Height: 5\' 3"  (1.6 m)    This visit occurred during the SARS-CoV-2 public health emergency.  Safety protocols were in place, including screening questions prior to the visit, additional usage of staff PPE, and extensive cleaning of exam room while observing appropriate contact time as indicated for disinfecting solutions.   Assessment & Plan:  Shingrix IM given at visit  Visit time 25 minutes in face to face communication with patient and coordination of care, additional 5 minutes spent in record review, coordination or care, ordering tests, communicating/referring to other healthcare professionals, documenting in medical records all on the same day of the visit for total time 30 minutes spent on the visit.

## 2020-04-04 NOTE — Assessment & Plan Note (Signed)
Rx contrave and checking labs to ensure optimization for weight loss potential. Treat as indicated based on results.

## 2020-04-04 NOTE — Assessment & Plan Note (Signed)
Checking HgA1c. Weight is increased in the last year and this raises risk for progression to diabetes.

## 2020-04-05 ENCOUNTER — Other Ambulatory Visit: Payer: Self-pay | Admitting: Internal Medicine

## 2020-04-05 MED ORDER — VITAMIN D (ERGOCALCIFEROL) 1.25 MG (50000 UNIT) PO CAPS: 50000.0000 [IU] | ORAL_CAPSULE | ORAL | 0 refills | Status: DC

## 2020-04-10 ENCOUNTER — Other Ambulatory Visit: Payer: Self-pay | Admitting: Internal Medicine

## 2020-04-23 ENCOUNTER — Other Ambulatory Visit: Payer: Self-pay | Admitting: Internal Medicine

## 2020-05-10 ENCOUNTER — Other Ambulatory Visit: Payer: Self-pay | Admitting: Internal Medicine

## 2020-05-31 ENCOUNTER — Other Ambulatory Visit: Payer: Self-pay | Admitting: Internal Medicine

## 2020-05-31 DIAGNOSIS — Z1231 Encounter for screening mammogram for malignant neoplasm of breast: Secondary | ICD-10-CM

## 2020-06-03 ENCOUNTER — Other Ambulatory Visit: Payer: Self-pay | Admitting: Internal Medicine

## 2020-06-25 ENCOUNTER — Other Ambulatory Visit: Payer: Self-pay | Admitting: Internal Medicine

## 2020-07-05 ENCOUNTER — Other Ambulatory Visit: Payer: Self-pay

## 2020-07-05 ENCOUNTER — Ambulatory Visit (INDEPENDENT_AMBULATORY_CARE_PROVIDER_SITE_OTHER): Payer: 59 | Admitting: Internal Medicine

## 2020-07-05 ENCOUNTER — Other Ambulatory Visit: Payer: Self-pay | Admitting: Internal Medicine

## 2020-07-05 ENCOUNTER — Encounter: Payer: Self-pay | Admitting: Internal Medicine

## 2020-07-05 MED ORDER — PLENITY WELCOME KIT PO CAPS: 1.0000 | ORAL_CAPSULE | Freq: Three times a day (TID) | ORAL | 5 refills | Status: DC

## 2020-07-05 MED ORDER — FUROSEMIDE 40 MG PO TABS: 40.0000 mg | ORAL_TABLET | Freq: Every day | ORAL | 3 refills | Status: DC

## 2020-07-05 NOTE — Patient Instructions (Signed)
We have sent in the fluid pill refill.   We have also sent in plenity for the weight loss. Take this about 20 minutes before meals with every meal to help you feel full faster.

## 2020-07-05 NOTE — Progress Notes (Signed)
   Subjective:   Patient ID: Linda Cabrera, female    DOB: [birth year], 66 y.o.   MRN: [id number]  HPI The patient is a 65 YO female coming in for follow up weight. Was unable to start contrave due to cost and lack of coverage. Would like to try new plenity she has seen commercial for. Has tried dieting on her own without much success since last visit.   Review of Systems  Constitutional: Negative.   HENT: Negative.   Eyes: Negative.   Respiratory: Negative for cough, chest tightness and shortness of breath.   Cardiovascular: Negative for chest pain, palpitations and leg swelling.  Gastrointestinal: Negative for abdominal distention, abdominal pain, constipation, diarrhea, nausea and vomiting.  Musculoskeletal: Negative.   Skin: Negative.   Neurological: Negative.   Psychiatric/Behavioral: Negative.     Objective:  Physical Exam Constitutional:      Appearance: She is well-developed. She is obese.  HENT:     Head: Normocephalic and atraumatic.  Cardiovascular:     Rate and Rhythm: Normal rate and regular rhythm.  Pulmonary:     Effort: Pulmonary effort is normal. No respiratory distress.     Breath sounds: Normal breath sounds. No wheezing or rales.  Abdominal:     General: Bowel sounds are normal. There is no distension.     Palpations: Abdomen is soft.     Tenderness: There is no abdominal tenderness. There is no rebound.  Musculoskeletal:     Cervical back: Normal range of motion.  Skin:    General: Skin is warm and dry.  Neurological:     Mental Status: She is alert and oriented to person, place, and time.     Coordination: Coordination normal.     Vitals:   07/05/20 1018  BP: 134/72  Pulse: 60  Resp: 18  Temp: 98.7 F (37.1 C)  TempSrc: Oral  Height: 5\' 3"  (1.6 m)    This visit occurred during the SARS-CoV-2 public health emergency.  Safety protocols were in place, including screening questions prior to the visit, additional usage of staff PPE, and  extensive cleaning of exam room while observing appropriate contact time as indicated for disinfecting solutions.   Assessment & Plan:

## 2020-07-05 NOTE — Assessment & Plan Note (Signed)
Rx plenity per patient request and given instructions on use and mechanism of action. Return in 3 months to assess efficacy if she can afford this.

## 2020-07-09 ENCOUNTER — Telehealth: Payer: Self-pay | Admitting: Internal Medicine

## 2020-07-09 NOTE — Telephone Encounter (Signed)
See below

## 2020-07-09 NOTE — Telephone Encounter (Signed)
Patient calling to report vaginal bleeding since 3/25. No other symptoms. Last cycle over 10 years ago. Patient states she doesn't have a GYN  Please advise

## 2020-07-09 NOTE — Telephone Encounter (Signed)
Should see an ob/gyn and can call one to schedule they do not require referral. If having any symptoms such as SOB, chest pain, dizziness needs visit with our office.

## 2020-07-10 NOTE — Telephone Encounter (Signed)
Unable to get in contact with the patient due to the phone going straight to voicemail. LDVM with instructions from Dr. Sharlet Salina. Office number was provided in case she has additional questions or concerns.

## 2020-07-11 ENCOUNTER — Encounter: Payer: Self-pay | Admitting: Nurse Practitioner

## 2020-07-20 ENCOUNTER — Ambulatory Visit
Admission: RE | Admit: 2020-07-20 | Discharge: 2020-07-20 | Disposition: A | Discharge status: Home / Self Care | Payer: 59 | Source: Ambulatory Visit | Attending: Internal Medicine | Admitting: Internal Medicine

## 2020-07-20 ENCOUNTER — Other Ambulatory Visit: Payer: Self-pay

## 2020-07-20 DIAGNOSIS — Z1231 Encounter for screening mammogram for malignant neoplasm of breast: Secondary | ICD-10-CM

## 2020-07-28 ENCOUNTER — Other Ambulatory Visit: Payer: Self-pay | Admitting: Internal Medicine

## 2020-07-31 ENCOUNTER — Encounter: Payer: Self-pay | Admitting: Nurse Practitioner

## 2020-08-02 ENCOUNTER — Other Ambulatory Visit: Payer: Self-pay | Admitting: Internal Medicine

## 2020-08-08 ENCOUNTER — Encounter: Payer: Self-pay | Admitting: Nurse Practitioner

## 2020-08-08 ENCOUNTER — Other Ambulatory Visit (HOSPITAL_COMMUNITY)
Admission: RE | Admit: 2020-08-08 | Discharge: 2020-08-08 | Disposition: A | Discharge status: Home / Self Care | Payer: 59 | Source: Ambulatory Visit | Attending: Nurse Practitioner | Admitting: Nurse Practitioner

## 2020-08-08 ENCOUNTER — Ambulatory Visit: Payer: 59 | Admitting: Nurse Practitioner

## 2020-08-08 ENCOUNTER — Other Ambulatory Visit: Payer: Self-pay

## 2020-08-08 VITALS — BP 138/84 | Ht 63.0 in | Wt 322.0 lb

## 2020-08-08 DIAGNOSIS — N950 Postmenopausal bleeding: Secondary | ICD-10-CM | POA: Diagnosis not present

## 2020-08-08 DIAGNOSIS — Z124 Encounter for screening for malignant neoplasm of cervix: Secondary | ICD-10-CM

## 2020-08-08 NOTE — Progress Notes (Signed)
   Acute Office Visit  Subjective:    Patient ID: Linda Cabrera, female    DOB: September 19, 1955, 65 y.o.   MRN: [id number]   HPI 65 y.o. presents today as new patient for postmenopausal bleeding. LMP in 2016. Bleeding started 07/06/2020 and was similar to her previous menses. Bleeding was heavy first 1-2 days then tapered off with total bleeding time of 5 days. She is not bleeding today. Not on HRT. Normal pap history. Last pap in 2015.    Review of Systems  Gastrointestinal: Negative.   Genitourinary: Positive for vaginal bleeding. Negative for vaginal discharge and vaginal pain.  Uterus: difficult to palpate due to body habitus but no gross masses or tenderness     Objective:    Physical Exam Constitutional:      Appearance: Normal appearance. She is obese.  Genitourinary:    General: Normal vulva.     Vagina: Normal.     Cervix: Normal.     BP 138/84 (Cuff Size: Large)   Ht 5\' 3"  (1.6 m)   Wt (!) 322 lb (146.1 kg)   LMP 08/25/2014   BMI 57.04 kg/m  Wt Readings from Last 3 Encounters:  08/08/20 (!) 322 lb (146.1 kg)  04/03/20 (!) 323 lb (146.5 kg)  08/19/19 (!) 312 lb (141.5 kg)        Assessment & Plan:   Problem List Items Addressed This Visit   None   Visit Diagnoses    Encounter for Papanicolaou smear for cervical cancer screening    -  Primary   Relevant Orders   Cytology - PAP( [place])   Postmenopausal bleeding       Relevant Orders   US PELVIS TRANSVAGINAL NON-OB (TV ONLY)     Plan: We will schedule pelvic ultrasound. Pap with HR HPV pending. She is agreeable to plan.      Tamela Gammon DNP, 11:55 AM 08/08/2020

## 2020-08-16 LAB — CYTOLOGY - PAP
Comment: NEGATIVE
Comment: NEGATIVE
Diagnosis: NEGATIVE
HPV 16: NEGATIVE
HPV 18 / 45: NEGATIVE
High risk HPV: POSITIVE — AB

## 2020-09-03 ENCOUNTER — Telehealth: Payer: Self-pay | Admitting: Internal Medicine

## 2020-09-03 NOTE — Telephone Encounter (Signed)
Please have her check if qsymia or saxenda or wegovy are covered with her insurance. Some insurance companies do not cover any weight loss medications.

## 2020-09-03 NOTE — Telephone Encounter (Signed)
Patient called and said that her insurance wont cover Carboxymeth-Cellulose-CitricAc (PLENITY WELCOME KIT) CAPS. She was wondering if something else can be sent in. Please advise

## 2020-09-03 NOTE — Telephone Encounter (Signed)
See below

## 2020-09-04 NOTE — Telephone Encounter (Signed)
Unable to get in contact with the patient. LDVM with Dr. Nathanial Millman recommendations. Office number has been provided. Mychart message has been sent as well.

## 2020-09-05 MED ORDER — QSYMIA 7.5-46 MG PO CP24: 1.0000 | ORAL_CAPSULE | Freq: Every day | ORAL | 1 refills | Status: DC

## 2020-09-13 ENCOUNTER — Other Ambulatory Visit: Payer: 59

## 2020-09-13 ENCOUNTER — Other Ambulatory Visit: Payer: 59 | Admitting: Obstetrics & Gynecology

## 2020-09-13 DIAGNOSIS — Z0289 Encounter for other administrative examinations: Secondary | ICD-10-CM

## 2020-10-11 ENCOUNTER — Ambulatory Visit: Payer: 59 | Admitting: Obstetrics & Gynecology

## 2020-10-11 ENCOUNTER — Ambulatory Visit (INDEPENDENT_AMBULATORY_CARE_PROVIDER_SITE_OTHER): Payer: 59

## 2020-10-11 ENCOUNTER — Encounter: Payer: Self-pay | Admitting: Obstetrics & Gynecology

## 2020-10-11 ENCOUNTER — Other Ambulatory Visit: Payer: Self-pay

## 2020-10-11 VITALS — BP 122/72 | HR 78 | Resp 16

## 2020-10-11 DIAGNOSIS — N950 Postmenopausal bleeding: Secondary | ICD-10-CM

## 2020-10-11 DIAGNOSIS — R9389 Abnormal findings on diagnostic imaging of other specified body structures: Secondary | ICD-10-CM

## 2020-10-11 NOTE — Progress Notes (Signed)
    Linda Cabrera 18-Nov-1955 [id number]        65 y.o.  G1P1A1L1  RP: PMB for Pelvic US  HPI: Last PMB in 08/2020 for 5 days of light flow.  Seen by Marny Lowenstein on 08/08/2020:  Postmenopausal bleeding. LMP in 2016. Bleeding started 07/06/2020 and was similar to her previous menses. Bleeding was heavy first 1-2 days then tapered off with total bleeding time of 5 days. She is not bleeding today. Not on HRT.  Pap 08/08/2020 Negative/HPV HR Positive, but 16-18-45 Negative.   OB History  Gravida Para Term Preterm AB Living  1 1 1     1   SAB IAB Ectopic Multiple Live Births               # Outcome Date GA Lbr Len/2nd Weight Sex Delivery Anes PTL Lv  1 Term             Past medical history,surgical history, problem list, medications, allergies, family history and social history were all reviewed and documented in the EPIC chart.   Directed ROS with pertinent positives and negatives documented in the history of present illness/assessment and plan.  Exam:  There were no vitals filed for this visit. General appearance:  Normal  Pelvic US today: T/V images.  Anteverted uterus normal in size and shape with intramural fibroid measured at 1 cm located mid posterior uterus.  The overall uterine size is measured at 7.82 x 5.51 x 3.99 cm.  Thickened endometrium with cystic spaces and vascular flow compatible with an endometrial polyp measured at 1.2 cm.  The endometrial thickness is measured at 11.25 mm.  Both ovaries are difficult to visualize due to overlying bowel and patient's body habitus.  No adnexal mass seen.  No free fluid in the posterior cul-de-sac.   Assessment/Plan:  65 y.o. G1P1001   1. Postmenopausal bleeding Postmenopausal bleeding probably due to endometrial polyp.  Will confirm the presence of a polyp with a sonohysterogram.  Procedure explained to patient.  Follow-up for sonohysterogram.  2. Thickened endometrium As above. - [place] Sonohysterogram; Future   [name]  MD, 10:05 AM 10/11/2020

## 2020-11-15 ENCOUNTER — Other Ambulatory Visit: Payer: 59

## 2020-11-15 ENCOUNTER — Other Ambulatory Visit: Payer: 59 | Admitting: Obstetrics & Gynecology

## 2020-11-19 ENCOUNTER — Telehealth: Payer: Self-pay | Admitting: *Deleted

## 2020-11-19 NOTE — Telephone Encounter (Signed)
-----   Message from [name], MD sent at 11/15/2020 11:50 AM EDT ----- Regarding: Schedule surgery Surgery:  Hysteroscopy, Myosure Excision, Dilation and Curettage  Diagnosis: PMB, Probable Endometrial Polyp  Location: [facility]  Status: Outpatient  Time: 30 Minutes  Assistant: N/A  Urgency: First Available  Pre-Op Appointment: To Be Scheduled  Post-Op Appointment(s): 2 Weeks  Time Out Of Work: Day Of Surgery ONLY         ----- Message ----- From: Kathaleen Grinder Sent: 11/15/2020   9:36 AM EDT To: [name], MD Subject: SX                                             As discussed earlier about this patient. [place]  excision and D&C. Patient is aware that surgery will be scheduled and will get a call from [facility] about Surgery date and time.

## 2020-11-22 NOTE — Telephone Encounter (Signed)
Spoke with patient. Reviewed surgery dates. Patient request to proceed with surgery on 01/07/21.  Advised patient I will forward to business office for return call. I will return call once surgery date and time confirmed. Patient verbalizes understanding and is agreeable.   Surgery request sent.   Routing to [employer]

## 2020-11-22 NOTE — Telephone Encounter (Signed)
BMI 57.04, will need to schedule at [facility] OR.   Left message to call Sharee Pimple, RN at [facility], [phone]. Next available surgery date at [facility] 9/26. Please return call ASAP to confirm.

## 2020-11-26 ENCOUNTER — Telehealth: Payer: 59 | Admitting: Family Medicine

## 2020-11-26 NOTE — Progress Notes (Signed)
[place]  She was calling in for husband.

## 2020-11-26 NOTE — Telephone Encounter (Signed)
Spoke with patient regarding surgery benefits. Patient acknowledges understanding of information presented. Patient is aware that benefits presented are professional benefits only. Patient is aware that once surgery is scheduled, the hospital will call with separate benefits. See account note.  Routing to Glorianne Manchester, Therapist, sports.

## 2020-11-27 NOTE — Telephone Encounter (Signed)
Spoke with patient. Surgery date request confirmed.  Advised surgery is scheduled for 01/07/21, [facility] OR at 0800.  Surgery instruction sheet and hospital brochure reviewed, printed copy will be mailed. Patient advised if Covid screening and quarantine requirements and agreeable.   Routing to provider. Encounter closed.   Cc: Hayley Carder

## 2020-12-20 ENCOUNTER — Other Ambulatory Visit: Payer: Self-pay

## 2020-12-20 ENCOUNTER — Ambulatory Visit (INDEPENDENT_AMBULATORY_CARE_PROVIDER_SITE_OTHER): Payer: 59 | Admitting: Obstetrics & Gynecology

## 2020-12-20 ENCOUNTER — Encounter: Payer: Self-pay | Admitting: Obstetrics & Gynecology

## 2020-12-20 VITALS — BP 114/72 | HR 83 | Resp 16 | Ht 61.25 in | Wt 315.0 lb

## 2020-12-20 DIAGNOSIS — N950 Postmenopausal bleeding: Secondary | ICD-10-CM | POA: Diagnosis not present

## 2020-12-20 DIAGNOSIS — R9389 Abnormal findings on diagnostic imaging of other specified body structures: Secondary | ICD-10-CM | POA: Diagnosis not present

## 2020-12-20 NOTE — Progress Notes (Signed)
    Linda Cabrera September 23, 1955 [id number]        65 y.o.  G1P1001   RP: Preop HSC/Myosure Excision/D+C on 01/07/2021  HPI: PMB with probable Endometrial Polyp on Pelvic US.  No pelvic pain.   OB History  Gravida Para Term Preterm AB Living  '1 1 1     1  '$ SAB IAB Ectopic Multiple Live Births               # Outcome Date GA Lbr Len/2nd Weight Sex Delivery Anes PTL Lv  1 Term             Past medical history,surgical history, problem list, medications, allergies, family history and social history were all reviewed and documented in the EPIC chart.   Directed ROS with pertinent positives and negatives documented in the history of present illness/assessment and plan.  Exam:  Vitals:   12/20/20 1628  BP: 114/72  Pulse: 83  Resp: 16  Weight: (!) 315 lb (142.9 kg)  Height: 5' 1.25" (1.556 m)   General appearance:  Normal  Pelvic US 10/11/2020: T/V images.  Anteverted uterus normal in size and shape with intramural fibroid measured at 1 cm located mid posterior uterus.  The overall uterine size is measured at 7.82 x 5.51 x 3.99 cm.  Thickened endometrium with cystic spaces and vascular flow compatible with an endometrial polyp measured at 1.2 cm.  The endometrial thickness is measured at 11.25 mm.  Both ovaries are difficult to visualize due to overlying bowel and patient's body habitus.  No adnexal mass seen.  No free fluid in the posterior cul-de-sac.     Assessment/Plan:  64 y.o. G1P1001    1. Postmenopausal bleeding Postmenopausal bleeding probably due to endometrial polyp.  Decision to proceed with a HSC/Myosure Excision and D+C.  Preop preparation, surgery and risks, as well as postop precautions and expectations reviewed.  Patient voiced understanding and agreement with plan. Surgery scheduled at [facility] d/t morbid obesity.   2. Thickened endometrium As above.                        Patient was counseled as to the risk of surgery to include the following:  1. Infection  (prohylactic antibiotics will be administered)  2. DVT/Pulmonary Embolism (prophylactic pneumo compression stockings will be used)  3.Trauma to internal organs requiring additional surgical procedure to repair any injury to internal organs requiring perhaps additional hospitalization days.  4.Hemmorhage requiring transfusion and blood products which carry risks such as anaphylactic reaction, hepatitis and AIDS  Patient had received literature information on the procedure scheduled and all her questions were answered and fully accepts all risk.    [name] MD, 4:54 PM 12/20/2020

## 2021-01-02 ENCOUNTER — Other Ambulatory Visit: Payer: Self-pay

## 2021-01-02 ENCOUNTER — Encounter (HOSPITAL_COMMUNITY): Payer: Self-pay | Admitting: Obstetrics & Gynecology

## 2021-01-02 NOTE — Progress Notes (Signed)
PCP - Dr. Lesly Rubenstein  Cardiologist - Denies  EP-Denies  Endocrine-Denies  Pulm-Denies  Chest x-ray - Denies  EKG - 01/07/21 (DOS)  Stress Test - Denies  ECHO - 05/02/15 (E)  Cardiac Cath - Denies  AICD-na PM-na LOOP-na  Dialysis-Denies  Sleep Study - Yes- Negative CPAP - Denies  LABS- 01/07/21: CBC, BMP, T/S, UPreg  ASA- Denies  ERAS- No  HA1C- Denies  Anesthesia- No  Pt denies having chest pain, sob, or fever during the pre-op phone call. All instructions explained to the pt, with a verbal understanding of the material including: as of today, stop taking all Aspirin (unless instructed by your doctor) and Other Aspirin containing products, Vitamins, Fish oils, and Herbal medications. Also stop all NSAIDS i.e. Advil, Ibuprofen, Motrin, Aleve, Anaprox, Naproxen, BC, Goody Powders, and all Supplements. Pt also instructed to wear a mask and social distance if she has to go out prior to surgery. The opportunity to ask questions was provided.    Coronavirus Screening  Have you experienced the following symptoms:  Cough yes/no: No Fever (>100.21F)  yes/no: No Runny nose yes/no: No Sore throat yes/no: No Difficulty breathing/shortness of breath  yes/no: No  Have you or a family member traveled in the last 14 days and where? yes/no: No   If the patient indicates "YES" to the above questions, their PAT will be rescheduled to limit the exposure to others and, the surgeon will be notified. THE PATIENT WILL NEED TO BE ASYMPTOMATIC FOR 14 DAYS.   If the patient is not experiencing any of these symptoms, the PAT nurse will instruct them to NOT bring anyone with them to their appointment since they may have these symptoms or traveled as well.   Please remind your patients and families that hospital visitation restrictions are in effect and the importance of the restrictions.

## 2021-01-06 NOTE — Anesthesia Preprocedure Evaluation (Addendum)
Anesthesia Evaluation  Patient identified by MRN, date of birth, ID band Patient awake    Reviewed: Allergy & Precautions, NPO status , Patient's Chart, lab work & pertinent test results  History of Anesthesia Complications Negative for: history of anesthetic complications  Airway Mallampati: II  TM Distance: >3 FB Neck ROM: Full    Dental  (+) Missing,    Pulmonary asthma , former smoker,    Pulmonary exam normal        Cardiovascular hypertension, Pt. on medications Normal cardiovascular exam  TTE 2017: mild focalbasal hypertrophy of the septum, EF 50-55%, mild LAE, atrial septal aneurysm   Neuro/Psych negative neurological ROS  negative psych ROS   GI/Hepatic negative GI ROS, Neg liver ROS,   Endo/Other  Morbid obesity (BMI 60)  Renal/GU negative Renal ROS  negative genitourinary   Musculoskeletal  (+) Arthritis ,   Abdominal   Peds  Hematology negative hematology ROS (+)   Anesthesia Other Findings Day of surgery medications reviewed with patient.  Reproductive/Obstetrics Postmenopausal bleeding, endometrial polyp                            Anesthesia Physical Anesthesia Plan  ASA: 3  Anesthesia Plan: General   Post-op Pain Management:    Induction: Intravenous  PONV Risk Score and Plan: 3 and Treatment may vary due to age or medical condition, Ondansetron, Dexamethasone and Midazolam  Airway Management Planned: Oral ETT  Additional Equipment: None  Intra-op Plan:   Post-operative Plan: Extubation in OR  Informed Consent: I have reviewed the patients History and Physical, chart, labs and discussed the procedure including the risks, benefits and alternatives for the proposed anesthesia with the patient or authorized representative who has indicated his/her understanding and acceptance.     Dental advisory given  Plan Discussed with: CRNA  Anesthesia Plan Comments:         Anesthesia Quick Evaluation

## 2021-01-07 ENCOUNTER — Other Ambulatory Visit: Payer: Self-pay

## 2021-01-07 ENCOUNTER — Encounter (HOSPITAL_COMMUNITY)
Admission: RE | Disposition: A | Discharge status: Home / Self Care | Payer: Self-pay | Source: Ambulatory Visit | Attending: Obstetrics & Gynecology

## 2021-01-07 ENCOUNTER — Ambulatory Visit (HOSPITAL_COMMUNITY): Payer: 59 | Admitting: Anesthesiology

## 2021-01-07 ENCOUNTER — Ambulatory Visit (HOSPITAL_COMMUNITY)
Admission: RE | Admit: 2021-01-07 | Discharge: 2021-01-07 | Disposition: A | Discharge status: Home / Self Care | Payer: 59 | Source: Ambulatory Visit | Attending: Obstetrics & Gynecology | Admitting: Obstetrics & Gynecology

## 2021-01-07 DIAGNOSIS — N950 Postmenopausal bleeding: Secondary | ICD-10-CM | POA: Insufficient documentation

## 2021-01-07 DIAGNOSIS — D250 Submucous leiomyoma of uterus: Secondary | ICD-10-CM | POA: Insufficient documentation

## 2021-01-07 DIAGNOSIS — Z800 Family history of malignant neoplasm of digestive organs: Secondary | ICD-10-CM | POA: Diagnosis not present

## 2021-01-07 DIAGNOSIS — Z6841 Body Mass Index (BMI) 40.0 and over, adult: Secondary | ICD-10-CM | POA: Insufficient documentation

## 2021-01-07 DIAGNOSIS — Z9851 Tubal ligation status: Secondary | ICD-10-CM | POA: Diagnosis not present

## 2021-01-07 DIAGNOSIS — N840 Polyp of corpus uteri: Secondary | ICD-10-CM | POA: Diagnosis not present

## 2021-01-07 DIAGNOSIS — Z8249 Family history of ischemic heart disease and other diseases of the circulatory system: Secondary | ICD-10-CM | POA: Diagnosis not present

## 2021-01-07 DIAGNOSIS — Z888 Allergy status to other drugs, medicaments and biological substances status: Secondary | ICD-10-CM | POA: Insufficient documentation

## 2021-01-07 DIAGNOSIS — Z79899 Other long term (current) drug therapy: Secondary | ICD-10-CM | POA: Diagnosis not present

## 2021-01-07 DIAGNOSIS — I10 Essential (primary) hypertension: Secondary | ICD-10-CM | POA: Diagnosis not present

## 2021-01-07 DIAGNOSIS — Z7982 Long term (current) use of aspirin: Secondary | ICD-10-CM | POA: Diagnosis not present

## 2021-01-07 DIAGNOSIS — Z9049 Acquired absence of other specified parts of digestive tract: Secondary | ICD-10-CM | POA: Insufficient documentation

## 2021-01-07 DIAGNOSIS — Z87891 Personal history of nicotine dependence: Secondary | ICD-10-CM | POA: Insufficient documentation

## 2021-01-07 HISTORY — PX: DILATATION & CURETTAGE/HYSTEROSCOPY WITH MYOSURE: SHX6511

## 2021-01-07 LAB — BASIC METABOLIC PANEL
Anion gap: 8 (ref 5–15)
BUN: 11 mg/dL (ref 8–23)
CO2: 26 mmol/L (ref 22–32)
Calcium: 8.6 mg/dL — ABNORMAL LOW (ref 8.9–10.3)
Chloride: 103 mmol/L (ref 98–111)
Creatinine, Ser: 0.83 mg/dL (ref 0.44–1.00)
GFR, Estimated: >60 mL/min (ref >60)
Glucose, Bld: 96 mg/dL (ref 70–99)
Potassium: 3.8 mmol/L (ref 3.5–5.1)
Sodium: 137 mmol/L (ref 135–145)

## 2021-01-07 LAB — CBC
HCT: 41.2 % (ref 36.0–46.0)
Hemoglobin: 12.4 g/dL (ref 12.0–15.0)
MCH: 25.6 pg — ABNORMAL LOW (ref 26.0–34.0)
MCHC: 30.1 g/dL (ref 30.0–36.0)
MCV: 85.1 fL (ref 80.0–100.0)
Platelets: 208 10*3/uL (ref 150–400)
RBC: 4.84 MIL/uL (ref 3.87–5.11)
RDW: 18.3 % — ABNORMAL HIGH (ref 11.5–15.5)
WBC: 7.9 10*3/uL (ref 4.0–10.5)
nRBC: 0 % (ref 0.0–0.2)

## 2021-01-07 LAB — TYPE AND SCREEN
ABO/RH(D): O POS
Antibody Screen: NEGATIVE

## 2021-01-07 LAB — POCT PREGNANCY, URINE: Preg Test, Ur: NEGATIVE

## 2021-01-07 SURGERY — DILATATION & CURETTAGE/HYSTEROSCOPY WITH MYOSURE
Anesthesia: General

## 2021-01-07 MED ORDER — OXYCODONE HCL 5 MG/5ML PO SOLN: 5.0000 mg | Freq: Once | ORAL | Status: DC | PRN

## 2021-01-07 MED ORDER — PROPOFOL 10 MG/ML IV BOLUS
INTRAVENOUS | Status: AC
  Filled 2021-01-07: qty 20

## 2021-01-07 MED ORDER — MIDAZOLAM HCL 5 MG/5ML IJ SOLN
INTRAMUSCULAR | Status: DC | PRN
  Administered 2021-01-07: 2 mg via INTRAVENOUS

## 2021-01-07 MED ORDER — FENTANYL CITRATE (PF) 250 MCG/5ML IJ SOLN
INTRAMUSCULAR | Status: AC
  Filled 2021-01-07: qty 5

## 2021-01-07 MED ORDER — PROPOFOL 10 MG/ML IV BOLUS
INTRAVENOUS | Status: DC | PRN
  Administered 2021-01-07: 200 mg via INTRAVENOUS

## 2021-01-07 MED ORDER — FENTANYL CITRATE (PF) 100 MCG/2ML IJ SOLN: 25.0000 ug | INTRAMUSCULAR | Status: DC | PRN

## 2021-01-07 MED ORDER — LIDOCAINE 2% (20 MG/ML) 5 ML SYRINGE
INTRAMUSCULAR | Status: DC | PRN
  Administered 2021-01-07: 100 mg via INTRAVENOUS

## 2021-01-07 MED ORDER — CHLOROPROCAINE HCL 1 % IJ SOLN
INTRAMUSCULAR | Status: AC
  Filled 2021-01-07: qty 30

## 2021-01-07 MED ORDER — POVIDONE-IODINE 10 % EX SWAB
2.0000 "application " | Freq: Once | CUTANEOUS | Status: AC
  Administered 2021-01-07: 2 via TOPICAL

## 2021-01-07 MED ORDER — FENTANYL CITRATE (PF) 250 MCG/5ML IJ SOLN
INTRAMUSCULAR | Status: DC | PRN
  Administered 2021-01-07: 25 ug via INTRAVENOUS
  Administered 2021-01-07: 100 ug via INTRAVENOUS

## 2021-01-07 MED ORDER — CEFAZOLIN IN SODIUM CHLORIDE 3-0.9 GM/100ML-% IV SOLN
3.0000 g | INTRAVENOUS | Status: DC
  Filled 2021-01-07: qty 100

## 2021-01-07 MED ORDER — CHLORHEXIDINE GLUCONATE 0.12 % MT SOLN
15.0000 mL | Freq: Once | OROMUCOSAL | Status: AC
  Administered 2021-01-07: 15 mL via OROMUCOSAL
  Filled 2021-01-07: qty 15

## 2021-01-07 MED ORDER — OXYCODONE HCL 5 MG PO TABS: 5.0000 mg | ORAL_TABLET | Freq: Once | ORAL | Status: DC | PRN

## 2021-01-07 MED ORDER — DEXTROSE 5 % IV SOLN
INTRAVENOUS | Status: DC | PRN
  Administered 2021-01-07: 3 g via INTRAVENOUS

## 2021-01-07 MED ORDER — ONDANSETRON HCL 4 MG/2ML IJ SOLN
INTRAMUSCULAR | Status: DC | PRN
  Administered 2021-01-07: 4 mg via INTRAVENOUS

## 2021-01-07 MED ORDER — ACETAMINOPHEN 500 MG PO TABS
1000.0000 mg | ORAL_TABLET | Freq: Once | ORAL | Status: AC
  Administered 2021-01-07: 1000 mg via ORAL
  Filled 2021-01-07: qty 2

## 2021-01-07 MED ORDER — LACTATED RINGERS IV SOLN: INTRAVENOUS | Status: DC

## 2021-01-07 MED ORDER — MIDAZOLAM HCL 2 MG/2ML IJ SOLN
INTRAMUSCULAR | Status: AC
  Filled 2021-01-07: qty 2

## 2021-01-07 MED ORDER — SODIUM CHLORIDE 0.9 % IR SOLN
Status: DC | PRN
  Administered 2021-01-07: 3000 mL

## 2021-01-07 MED ORDER — ORAL CARE MOUTH RINSE: 15.0000 mL | Freq: Once | OROMUCOSAL | Status: AC

## 2021-01-07 MED ORDER — CHLOROPROCAINE HCL 1 % IJ SOLN
INTRAMUSCULAR | Status: DC | PRN
  Administered 2021-01-07: 10 mL

## 2021-01-07 MED ORDER — PROMETHAZINE HCL 25 MG/ML IJ SOLN: 6.2500 mg | INTRAMUSCULAR | Status: DC | PRN

## 2021-01-07 SURGICAL SUPPLY — 14 items
CATH ROBINSON RED A/P 16FR (CATHETERS) ×2 IMPLANT
DEVICE MYOSURE REACH (MISCELLANEOUS) ×2 IMPLANT
DILATOR CANAL MILEX (MISCELLANEOUS) ×2 IMPLANT
GLOVE SURG ENC MOIS LTX SZ6.5 (GLOVE) ×2 IMPLANT
GLOVE SURG UNDER POLY LF SZ7 (GLOVE) ×4 IMPLANT
GOWN STRL REUS W/ TWL LRG LVL3 (GOWN DISPOSABLE) ×2 IMPLANT
GOWN STRL REUS W/TWL LRG LVL3 (GOWN DISPOSABLE) ×4
KIT PROCEDURE FLUENT (KITS) ×2 IMPLANT
KIT TURNOVER KIT B (KITS) ×2 IMPLANT
PACK VAGINAL MINOR WOMEN LF (CUSTOM PROCEDURE TRAY) ×2 IMPLANT
PAD OB MATERNITY 4.3X12.25 (PERSONAL CARE ITEMS) ×2 IMPLANT
SEAL ROD LENS SCOPE MYOSURE (ABLATOR) ×2 IMPLANT
TOWEL GREEN STERILE FF (TOWEL DISPOSABLE) ×2 IMPLANT
UNDERPAD 30X36 HEAVY ABSORB (UNDERPADS AND DIAPERS) ×2 IMPLANT

## 2021-01-07 NOTE — Op Note (Signed)
Operative Note  01/07/2021  8:49 AM  PATIENT:  [name]  65 y.o. female  PRE-OPERATIVE DIAGNOSIS:  Postmenopausal bleeding, endometrial polyp  POST-OPERATIVE DIAGNOSIS:  Postmenopausal bleeding, endometrial polyp, submucosal myoma  PROCEDURE:  Procedure(s): DILATATION & CURETTAGE/HYSTEROSCOPY WITH MYOSURE EXCISION  SURGEON:  Surgeon(s): [name], MD  ANESTHESIA:   general with Laryngeal Mask  FINDINGS: 2 endometrial polyps and 1 completely submucosal myoma.  DESCRIPTION OF OPERATION: Under general anesthesia with laryngeal mask, the patient is in lithotomy position.  She is prepped with Betadine on the supra pubic, vulvar and vaginal areas.  The bladder is catheterized.  She is draped as usual.  Timeout is done.  The vaginal exam reveals an anteverted uterus, normal volume, mobile.  No adnexal mass felt.  The speculum is inserted in the vagina and the anterior lip of the cervix is grasped with a tenaculum.  A paracervical block is done with Nesacaine 1% a total of 10 cc at 4 and 8:00.  The os finder is used to start the dilation.  We then use Pratt dilators to dilate the cervix up to #19 without difficulty.  The hysteroscope is inserted in the intra uterine cavity and inspection is done.  Both ostia are well visualized.  2 polyps are present on the anterior wall midline and to the left fundal area and a completely submucosal myoma measuring about 2 cm is present on the right anterior wall and lateral wall of the uterine cavity.  Pictures are taken.  We used the MyoSure reach to excise completely all lesions.  Hemostasis is adequate.  Pictures are taken after excisions.  The hysteroscope with MyoSure are removed.  We proceed with a systematic curettage of the intra uterine cavity on all surfaces with a sharp curette.  The curette is removed.  All specimens are sent together to pathology.  The tenaculum is removed from the cervix.  Hemostasis is adequate.  The speculum is removed.   The patient is brought to recovery room in good and stable status.  ESTIMATED BLOOD LOSS: 5 cc FLUID DEFICIT: 70 cc   Intake/Output Summary (Last 24 hours) at 01/07/2021 0849 Last data filed at 01/07/2021 6389 Gross per 24 hour  Intake 50 ml  Output --  Net 50 ml     BLOOD ADMINISTERED:none   LOCAL MEDICATIONS USED:  Nesacaine 1% 10 cc for paracervical block  SPECIMEN:  Source of Specimen:  Endometrial Polyps/Submucosal Myoma, Endometrial Curettings  DISPOSITION OF SPECIMEN:  PATHOLOGY  COUNTS:  YES  PLAN OF CARE: Transfer to PACU  Marie-Lyne LavoieMD8:49 AM

## 2021-01-07 NOTE — H&P (Signed)
Linda Cabrera is an 65 y.o. female.G1P1001    RP: HSC/Myosure Excision/D+C on 01/07/2021   HPI: No change x last visit 12/20/20.  PMB with probable Endometrial Polyp on Pelvic US.  No pelvic pain.    Pertinent Gynecological History: Menses: post-menopausal Contraception: TL/post menopausal status Blood transfusions: none Last mammogram: normal  Last pap: normal  OB History: G1, P1    Past Medical History:  Diagnosis Date   Anemia, unspecified 07/13/2015   Arthritis    Asthma    Herpes    Hypertension     Past Surgical History:  Procedure Laterality Date   BREATH TEK H PYLORI N/A 02/13/2014   Procedure: BREATH TEK H PYLORI;  Surgeon: Gayland Curry, MD;  Location: [facility];  Service: General;  Laterality: N/A;   CHOLECYSTECTOMY N/A 04/29/2019   Procedure: LAPAROSCOPIC CHOLECYSTECTOMY;  Surgeon: Mickeal Skinner, MD;  Location: [facility];  Service: General;  Laterality: N/A;   COLONOSCOPY N/A 08/15/2013   Procedure: COLONOSCOPY;  Surgeon: Jerene Bears, MD;  Location: [facility];  Service: Gastroenterology;  Laterality: N/A;   TONSILLECTOMY     TUBAL LIGATION      Family History  Problem Relation Age of Onset   Arthritis Mother    Hypertension Mother    Cancer Mother        pancreatic   Pancreatic cancer Mother    Arthritis Father    Hypertension Father    Colon cancer Neg Hx     Social History:  reports that she quit smoking about 16 years ago. Her smoking use included cigarettes. She has never used smokeless tobacco. She reports current alcohol use. She reports that she does not use drugs.  Allergies:  Allergies  Allergen Reactions   Ace Inhibitors Swelling    Medications Prior to Admission  Medication Sig Dispense Refill Last Dose   albuterol (VENTOLIN HFA) 108 (90 Base) MCG/ACT inhaler INHALE TWO PUFFS INTO THE LUNGS EVERY 6 HOURS AS NEEDED FOR WHEEZING OR SHORTNESS OF BREATH 18 each 1 01/07/2021 at 0400   docusate sodium (COLACE) 100 MG capsule Take 1  capsule (100 mg total) by mouth 2 (two) times daily. (Patient taking differently: Take 100 mg by mouth daily as needed for mild constipation.) 180 capsule 3    furosemide (LASIX) 40 MG tablet Take 1 tablet (40 mg total) by mouth daily. 90 tablet 3 01/06/2021   valACYclovir (VALTREX) 1000 MG tablet TAKE 1 TABLET BY MOUTH EVERY DAY 90 tablet 3 01/06/2021   aspirin EC 81 MG tablet Take 1 tablet (81 mg total) by mouth daily. (Patient not taking: Reported on 01/01/2021) 90 tablet 11 Not Taking   montelukast (SINGULAIR) 10 MG tablet TAKE 1 TABLET BY MOUTH EVERYDAY AT BEDTIME (Patient not taking: Reported on 01/01/2021) 90 tablet 1 Not Taking    REVIEW OF SYSTEMS: A ROS was performed and pertinent positives and negatives are included in the history.  GENERAL: No fevers or chills. HEENT: No change in vision, no earache, sore throat or sinus congestion. NECK: No pain or stiffness. CARDIOVASCULAR: No chest pain or pressure. No palpitations. PULMONARY: No shortness of breath, cough or wheeze. GASTROINTESTINAL: No abdominal pain, nausea, vomiting or diarrhea, melena or bright red blood per rectum. GENITOURINARY: No urinary frequency, urgency, hesitancy or dysuria. MUSCULOSKELETAL: No joint or muscle pain, no back pain, no recent trauma. DERMATOLOGIC: No rash, no itching, no lesions. ENDOCRINE: No polyuria, polydipsia, no heat or cold intolerance. No recent change in weight. HEMATOLOGICAL: No anemia  or easy bruising or bleeding. NEUROLOGIC: No headache, seizures, numbness, tingling or weakness. PSYCHIATRIC: No depression, no loss of interest in normal activity or change in sleep pattern.     Blood pressure (!) 156/96, pulse 76, temperature 97.8 F (36.6 C), temperature source Oral, resp. rate 17, height 5\' 1"  (1.549 m), weight (!) 142.9 kg, last menstrual period 09/11/2020, SpO2 97 %.  Physical Exam:  Lungs clear RCR  Results for orders placed or performed during the hospital encounter of 01/07/21 (from the past  24 hour(s))  Type and screen     Status: None (Preliminary result)   Collection Time: 01/07/21  6:18 AM  Result Value Ref Range   ABO/RH(D) PENDING    Antibody Screen PENDING    Sample Expiration      01/10/2021,2359 Performed at [facility], [place] [address] [place], [place] [address]   Pregnancy, urine POC     Status: None   Collection Time: 01/07/21  6:25 AM  Result Value Ref Range   Preg Test, Ur NEGATIVE NEGATIVE   Pelvic US 10/11/2020: T/V images.  Anteverted uterus normal in size and shape with intramural fibroid measured at 1 cm located mid posterior uterus.  The overall uterine size is measured at 7.82 x 5.51 x 3.99 cm.  Thickened endometrium with cystic spaces and vascular flow compatible with an endometrial polyp measured at 1.2 cm.  The endometrial thickness is measured at 11.25 mm.  Both ovaries are difficult to visualize due to overlying bowel and patient's body habitus.  No adnexal mass seen.  No free fluid in the posterior cul-de-sac.     Assessment/Plan:  65 y.o. G1P1001    1. Postmenopausal bleeding Postmenopausal bleeding probably due to endometrial polyp.  Decision to proceed with a HSC/Myosure Excision and D+C.  Preop preparation, surgery and risks, as well as postop precautions and expectations reviewed.  Patient voiced understanding and agreement with plan. Surgery scheduled at [facility] d/t morbid obesity.   2. Thickened endometrium As above.                         Patient was counseled as to the risk of surgery to include the following:   1. Infection (prohylactic antibiotics will be administered)   2. DVT/Pulmonary Embolism (prophylactic pneumo compression stockings will be used)   3.Trauma to internal organs requiring additional surgical procedure to repair any injury to internal organs requiring perhaps additional hospitalization days.   4.Hemmorhage requiring transfusion and blood products which carry risks such as anaphylactic reaction, hepatitis and  AIDS   Patient had received literature information on the procedure scheduled and all her questions were answered and fully accepts all risk.      Linda Cabrera 01/07/2021, 7:15 AM

## 2021-01-07 NOTE — Transfer of Care (Signed)
Immediate Anesthesia Transfer of Care Note  Patient: Linda Cabrera  Procedure(s) Performed: DILATATION & CURETTAGE/HYSTEROSCOPY WITH MYOSURE  Patient Location: PACU  Anesthesia Type:General  Level of Consciousness: awake, alert  and oriented  Airway & Oxygen Therapy: Patient Spontanous Breathing  Post-op Assessment: Report given to RN, Post -op Vital signs reviewed and stable and Patient moving all extremities  Post vital signs: Reviewed and stable  Last Vitals:  Vitals Value Taken Time  BP 162/88 01/07/21 0901  Temp 36.2 C 01/07/21 0900  Pulse 74 01/07/21 0903  Resp 18 01/07/21 0903  SpO2 95 % 01/07/21 0903  Vitals shown include unvalidated device data.  Last Pain:  Vitals:   01/07/21 0708  TempSrc:   PainSc: 0-No pain      Patients Stated Pain Goal: 3 (25/08/71 9941)  Complications: No notable events documented.

## 2021-01-07 NOTE — Anesthesia Postprocedure Evaluation (Signed)
Anesthesia Post Note  Patient: Linda Cabrera  Procedure(s) Performed: DILATATION & CURETTAGE/HYSTEROSCOPY WITH [place]     Patient location during evaluation: PACU Anesthesia Type: General Level of consciousness: awake and alert and oriented Pain management: pain level controlled Vital Signs Assessment: post-procedure vital signs reviewed and stable Respiratory status: spontaneous breathing, nonlabored ventilation and respiratory function stable Cardiovascular status: blood pressure returned to baseline Postop Assessment: no apparent nausea or vomiting Anesthetic complications: no   No notable events documented.  Last Vitals:  Vitals:   01/07/21 0900 01/07/21 0930  BP: (!) 162/88 (!) 155/60  Pulse: 75 63  Resp: 15 20  Temp: (!) 36.2 C (!) 36.2 C  SpO2: 95% 97%    Last Pain:  Vitals:   01/07/21 0930  TempSrc:   PainSc: 0-No pain                 Marthenia Rolling

## 2021-01-07 NOTE — Anesthesia Procedure Notes (Signed)
Procedure Name: LMA Insertion Date/Time: 01/07/2021 8:17 AM Performed by: Amadeo Garnet, CRNA Pre-anesthesia Checklist: Patient identified, Emergency Drugs available, Suction available and Patient being monitored Patient Re-evaluated:Patient Re-evaluated prior to induction Oxygen Delivery Method: Circle system utilized Preoxygenation: Pre-oxygenation with 100% oxygen Induction Type: IV induction LMA: LMA inserted LMA Size: 5.0 Placement Confirmation: positive ETCO2 and breath sounds checked- equal and bilateral Dental Injury: Teeth and Oropharynx as per pre-operative assessment

## 2021-01-08 ENCOUNTER — Encounter (HOSPITAL_COMMUNITY): Payer: Self-pay | Admitting: Obstetrics & Gynecology

## 2021-01-08 LAB — SURGICAL PATHOLOGY

## 2021-01-22 ENCOUNTER — Ambulatory Visit (INDEPENDENT_AMBULATORY_CARE_PROVIDER_SITE_OTHER): Payer: 59 | Admitting: Obstetrics & Gynecology

## 2021-01-22 ENCOUNTER — Encounter: Payer: Self-pay | Admitting: Obstetrics & Gynecology

## 2021-01-22 ENCOUNTER — Other Ambulatory Visit: Payer: Self-pay

## 2021-01-22 VITALS — BP 114/80

## 2021-01-22 DIAGNOSIS — Z09 Encounter for follow-up examination after completed treatment for conditions other than malignant neoplasm: Secondary | ICD-10-CM

## 2021-01-22 NOTE — Progress Notes (Signed)
    Linda Cabrera Aug 14, 1955 [id number]        65 y.o.  G1P1001   RP: Postop HSC/Myosure/D+C  HPI: Doing very well.  Pelvic pain, no vaginal bleeding or discharge.  No fever.   OB History  Gravida Para Term Preterm AB Living  1 1 1     1   SAB IAB Ectopic Multiple Live Births               # Outcome Date GA Lbr Len/2nd Weight Sex Delivery Anes PTL Lv  1 Term             Past medical history,surgical history, problem list, medications, allergies, family history and social history were all reviewed and documented in the EPIC chart.   Directed ROS with pertinent positives and negatives documented in the history of present illness/assessment and plan.  Exam:  Vitals:   01/22/21 0917  BP: 114/80   General appearance:  Normal  Abdomen: Normal  Gynecologic exam: Vulva normal.  Bimanual exam: Cervix nontender to mobilization.  Uterus anteverted, normal volume, mobile.  No adnexal mass, nontender.  Patho:  Benign endometrial Polyp.  No hyperplasia, no cancer.   Assessment/Plan:  65 y.o. G1P1001   1. Status post gynecological surgery, follow-up exam  Good postop healing.  No complication.  Pathology benign, reviewed with patient.  We will follow-up for annual gynecologic exam in April 2023 with Marny Lowenstein.  [name] MD, 9:46 AM 01/22/2021

## 2021-02-24 ENCOUNTER — Encounter (HOSPITAL_COMMUNITY): Payer: Self-pay | Admitting: Emergency Medicine

## 2021-02-24 ENCOUNTER — Inpatient Hospital Stay (HOSPITAL_COMMUNITY)
Admission: EM | Admit: 2021-02-24 | Discharge: 2021-02-26 | DRG: 202 | Disposition: A | Discharge status: Home / Self Care | Payer: 59 | Attending: Internal Medicine | Admitting: Internal Medicine

## 2021-02-24 ENCOUNTER — Emergency Department (HOSPITAL_COMMUNITY): Payer: 59

## 2021-02-24 ENCOUNTER — Other Ambulatory Visit: Payer: Self-pay

## 2021-02-24 DIAGNOSIS — J9601 Acute respiratory failure with hypoxia: Secondary | ICD-10-CM | POA: Diagnosis present

## 2021-02-24 DIAGNOSIS — Z6841 Body Mass Index (BMI) 40.0 and over, adult: Secondary | ICD-10-CM | POA: Diagnosis not present

## 2021-02-24 DIAGNOSIS — J4551 Severe persistent asthma with (acute) exacerbation: Secondary | ICD-10-CM | POA: Diagnosis present

## 2021-02-24 DIAGNOSIS — Z8249 Family history of ischemic heart disease and other diseases of the circulatory system: Secondary | ICD-10-CM | POA: Diagnosis not present

## 2021-02-24 DIAGNOSIS — Z888 Allergy status to other drugs, medicaments and biological substances status: Secondary | ICD-10-CM

## 2021-02-24 DIAGNOSIS — I10 Essential (primary) hypertension: Secondary | ICD-10-CM | POA: Diagnosis present

## 2021-02-24 DIAGNOSIS — G44201 Tension-type headache, unspecified, intractable: Secondary | ICD-10-CM | POA: Diagnosis not present

## 2021-02-24 DIAGNOSIS — J45901 Unspecified asthma with (acute) exacerbation: Secondary | ICD-10-CM | POA: Diagnosis not present

## 2021-02-24 DIAGNOSIS — R7303 Prediabetes: Secondary | ICD-10-CM | POA: Diagnosis present

## 2021-02-24 DIAGNOSIS — Z20822 Contact with and (suspected) exposure to covid-19: Secondary | ICD-10-CM | POA: Diagnosis present

## 2021-02-24 DIAGNOSIS — Z800 Family history of malignant neoplasm of digestive organs: Secondary | ICD-10-CM | POA: Diagnosis not present

## 2021-02-24 DIAGNOSIS — Z79899 Other long term (current) drug therapy: Secondary | ICD-10-CM

## 2021-02-24 DIAGNOSIS — R519 Headache, unspecified: Secondary | ICD-10-CM | POA: Diagnosis present

## 2021-02-24 DIAGNOSIS — Z87891 Personal history of nicotine dependence: Secondary | ICD-10-CM

## 2021-02-24 DIAGNOSIS — Z9049 Acquired absence of other specified parts of digestive tract: Secondary | ICD-10-CM

## 2021-02-24 DIAGNOSIS — Z7982 Long term (current) use of aspirin: Secondary | ICD-10-CM | POA: Diagnosis not present

## 2021-02-24 LAB — CBC WITH DIFFERENTIAL/PLATELET
Abs Immature Granulocytes: 0.02 10*3/uL (ref 0.00–0.07)
Basophils Absolute: 0 10*3/uL (ref 0.0–0.1)
Basophils Relative: 0 %
Eosinophils Absolute: 0.3 10*3/uL (ref 0.0–0.5)
Eosinophils Relative: 3 %
HCT: 46 % (ref 36.0–46.0)
Hemoglobin: 14.2 g/dL (ref 12.0–15.0)
Immature Granulocytes: 0 %
Lymphocytes Relative: 40 %
Lymphs Abs: 3.4 10*3/uL (ref 0.7–4.0)
MCH: 25.8 pg — ABNORMAL LOW (ref 26.0–34.0)
MCHC: 30.9 g/dL (ref 30.0–36.0)
MCV: 83.5 fL (ref 80.0–100.0)
Monocytes Absolute: 0.5 10*3/uL (ref 0.1–1.0)
Monocytes Relative: 5 %
Neutro Abs: 4.4 10*3/uL (ref 1.7–7.7)
Neutrophils Relative %: 52 %
Platelets: 217 10*3/uL (ref 150–400)
RBC: 5.51 MIL/uL — ABNORMAL HIGH (ref 3.87–5.11)
RDW: 19.6 % — ABNORMAL HIGH (ref 11.5–15.5)
WBC: 8.5 10*3/uL (ref 4.0–10.5)
nRBC: 0 % (ref 0.0–0.2)

## 2021-02-24 LAB — BASIC METABOLIC PANEL
Anion gap: 8 (ref 5–15)
BUN: 15 mg/dL (ref 8–23)
CO2: 27 mmol/L (ref 22–32)
Calcium: 8.6 mg/dL — ABNORMAL LOW (ref 8.9–10.3)
Chloride: 107 mmol/L (ref 98–111)
Creatinine, Ser: 0.76 mg/dL (ref 0.44–1.00)
GFR, Estimated: >60 mL/min (ref >60)
Glucose, Bld: 105 mg/dL — ABNORMAL HIGH (ref 70–99)
Potassium: 4.1 mmol/L (ref 3.5–5.1)
Sodium: 142 mmol/L (ref 135–145)

## 2021-02-24 LAB — BLOOD GAS, VENOUS
Acid-Base Excess: 0.8 mmol/L (ref 0.0–2.0)
Bicarbonate: 26.3 mmol/L (ref 20.0–28.0)
O2 Saturation: 99.1 %
Patient temperature: 98.6
pCO2, Ven: 47.9 mmHg (ref 44.0–60.0)
pH, Ven: 7.359 (ref 7.250–7.430)
pO2, Ven: 136 mmHg — ABNORMAL HIGH (ref 32.0–45.0)

## 2021-02-24 LAB — RESP PANEL BY RT-PCR (FLU A&B, COVID) ARPGX2
Influenza A by PCR: NEGATIVE
Influenza B by PCR: NEGATIVE
SARS Coronavirus 2 by RT PCR: NEGATIVE

## 2021-02-24 MED ORDER — IPRATROPIUM-ALBUTEROL 0.5-2.5 (3) MG/3ML IN SOLN
3.0000 mL | Freq: Four times a day (QID) | RESPIRATORY_TRACT | Status: DC
  Administered 2021-02-24 – 2021-02-25 (×4): 3 mL via RESPIRATORY_TRACT
  Filled 2021-02-24 (×3): qty 3

## 2021-02-24 MED ORDER — ACETAMINOPHEN 650 MG RE SUPP: 650.0000 mg | Freq: Four times a day (QID) | RECTAL | Status: DC | PRN

## 2021-02-24 MED ORDER — ENOXAPARIN SODIUM 40 MG/0.4ML IJ SOSY
40.0000 mg | PREFILLED_SYRINGE | INTRAMUSCULAR | Status: DC
  Administered 2021-02-24 – 2021-02-25 (×2): 40 mg via SUBCUTANEOUS
  Filled 2021-02-24 (×2): qty 0.4

## 2021-02-24 MED ORDER — GUAIFENESIN ER 600 MG PO TB12
600.0000 mg | ORAL_TABLET | Freq: Two times a day (BID) | ORAL | Status: DC
  Administered 2021-02-24 – 2021-02-26 (×4): 600 mg via ORAL
  Filled 2021-02-24 (×4): qty 1

## 2021-02-24 MED ORDER — METHYLPREDNISOLONE SODIUM SUCC 125 MG IJ SOLR
125.0000 mg | Freq: Once | INTRAMUSCULAR | Status: AC
  Administered 2021-02-24: 125 mg via INTRAVENOUS
  Filled 2021-02-24: qty 2

## 2021-02-24 MED ORDER — METHYLPREDNISOLONE SODIUM SUCC 125 MG IJ SOLR
60.0000 mg | Freq: Two times a day (BID) | INTRAMUSCULAR | Status: AC
  Administered 2021-02-25 (×2): 60 mg via INTRAVENOUS
  Filled 2021-02-24 (×3): qty 2

## 2021-02-24 MED ORDER — PREDNISONE 20 MG PO TABS
40.0000 mg | ORAL_TABLET | Freq: Every day | ORAL | Status: DC
  Administered 2021-02-26: 40 mg via ORAL
  Filled 2021-02-24: qty 2

## 2021-02-24 MED ORDER — ACETAMINOPHEN 325 MG PO TABS
650.0000 mg | ORAL_TABLET | Freq: Four times a day (QID) | ORAL | Status: DC | PRN
  Administered 2021-02-24 – 2021-02-25 (×2): 650 mg via ORAL
  Filled 2021-02-24 (×2): qty 2

## 2021-02-24 MED ORDER — ALBUTEROL SULFATE (2.5 MG/3ML) 0.083% IN NEBU
INHALATION_SOLUTION | RESPIRATORY_TRACT | Status: AC
  Filled 2021-02-24: qty 12

## 2021-02-24 MED ORDER — ALBUTEROL (5 MG/ML) CONTINUOUS INHALATION SOLN
10.0000 mg/h | INHALATION_SOLUTION | Freq: Once | RESPIRATORY_TRACT | Status: AC
  Administered 2021-02-24: 10 mg/h via RESPIRATORY_TRACT

## 2021-02-24 MED ORDER — DOCUSATE SODIUM 100 MG PO CAPS
100.0000 mg | ORAL_CAPSULE | Freq: Once | ORAL | Status: AC
  Administered 2021-02-24: 100 mg via ORAL
  Filled 2021-02-24: qty 1

## 2021-02-24 MED ORDER — MAGNESIUM SULFATE 2 GM/50ML IV SOLN
2.0000 g | Freq: Once | INTRAVENOUS | Status: AC
  Administered 2021-02-24: 2 g via INTRAVENOUS
  Filled 2021-02-24: qty 50

## 2021-02-24 NOTE — ED Triage Notes (Signed)
65 yo female with hx of asthma BIBA c/o resp distress x 1 week. Rescues inhalers used several times with no relief per pt. EMS gave total 15 mg of albuterol and 1mg  atrovent. Per EMS pts o2 sat 89% on RA. Expiratory wheezing and diminished lung sounds in all fields per EMS.  Vitals Hr 88 Rr 18 Bp 164/80 Spo2 100 % on 8L o2

## 2021-02-24 NOTE — H&P (Signed)
History and Physical    Linda Cabrera OJJ:[id number] DOB: 1955-11-06 DOA: 02/24/2021  PCP: Hoyt Koch, MD  Patient coming from: Home  Chief Complaint: dyspnea  HPI: Linda Cabrera is a 65 y.o. female with medical history significant of asthma, HTN, morbid obesity. Presenting with dyspnea. Her symptoms started a week ago. She noticed herself becoming more short of breath and requiring increased rescue inhaler use. She also had a non-productive cough. She didn't have access to a nebulizer, so she was reliant on her inhalers. They were not providing relief. She endured her symptoms through this morning. At that time she was having severe cough, HA and an inability to catch her breath. Family became worried and called for EMS. She denies any other aggravating or alleviating factors.    ED Course: En route, she was given 2 duonebs, but she was in severe distress when she presented. She was placed on Bipap. She was given steroids and mag. She was able to wean to 2L [place]. CXR w/ negative. [facility] was called for admission.   Review of Systems:  Review of systems is otherwise negative for all not mentioned in HPI.   PMHx Past Medical History:  Diagnosis Date   Anemia, unspecified 07/13/2015   Arthritis    Asthma    Herpes    Hypertension     PSHx Past Surgical History:  Procedure Laterality Date   BREATH TEK H PYLORI N/A 02/13/2014   Procedure: BREATH TEK H PYLORI;  Surgeon: Gayland Curry, MD;  Location: [facility];  Service: General;  Laterality: N/A;   CHOLECYSTECTOMY N/A 04/29/2019   Procedure: LAPAROSCOPIC CHOLECYSTECTOMY;  Surgeon: Mickeal Skinner, MD;  Location: [facility];  Service: General;  Laterality: N/A;   COLONOSCOPY N/A 08/15/2013   Procedure: COLONOSCOPY;  Surgeon: Jerene Bears, MD;  Location: [facility];  Service: Gastroenterology;  Laterality: N/A;   DILATATION & CURETTAGE/HYSTEROSCOPY WITH MYOSURE N/A 01/07/2021   Procedure: [place];  Surgeon: [name], MD;  Location: [facility];  Service: Gynecology;  Laterality: N/A;   TONSILLECTOMY     TUBAL LIGATION      SocHx  reports that she quit smoking about 16 years ago. Her smoking use included cigarettes. She has never used smokeless tobacco. She reports current alcohol use. She reports that she does not use drugs.  Allergies  Allergen Reactions   Ace Inhibitors Swelling    FamHx Family History  Problem Relation Age of Onset   Arthritis Mother    Hypertension Mother    Cancer Mother        pancreatic   Pancreatic cancer Mother    Arthritis Father    Hypertension Father    Colon cancer Neg Hx     Prior to Admission medications   Medication Sig Start Date End Date Taking? Authorizing Provider  albuterol (VENTOLIN HFA) 108 (90 Base) MCG/ACT inhaler INHALE TWO PUFFS INTO THE LUNGS EVERY 6 HOURS AS NEEDED FOR WHEEZING OR SHORTNESS OF BREATH 08/01/20   Hoyt Koch, MD  aspirin EC 81 MG tablet Take 1 tablet (81 mg total) by mouth daily. 07/11/19   Hoyt Koch, MD  docusate sodium (COLACE) 100 MG capsule Take 1 capsule (100 mg total) by mouth 2 (two) times daily. Patient taking differently: Take 100 mg by mouth daily as needed for mild constipation. 08/19/19   Hoyt Koch, MD  furosemide (LASIX) 40 MG tablet Take 1 tablet (40 mg total) by mouth daily.  07/05/20   Hoyt Koch, MD  montelukast (SINGULAIR) 10 MG tablet TAKE 1 TABLET BY MOUTH EVERYDAY AT BEDTIME 01/10/20   Hoyt Koch, MD  valACYclovir (VALTREX) 1000 MG tablet TAKE 1 TABLET BY MOUTH EVERY DAY 08/02/20   Hoyt Koch, MD    Physical Exam: Vitals:   02/24/21 1315 02/24/21 1331 02/24/21 1445 02/24/21 1458  BP:   130/78   Pulse: 98  84 85  Resp: 16  19 (!) 22  Temp:      TempSrc:      SpO2: 100% 100% 100% 99%    General: 65 y.o. female resting in bed in NAD Eyes: PERRL, normal sclera ENMT: Nares patent w/o discharge, orophaynx  clear, dentition normal, ears w/o discharge/lesions/ulcers Neck: Supple, trachea midline Cardiovascular: tachy, +S1, S2, no m/g/r, equal pulses throughout Respiratory: diffuse insp/exp wheeze, no r/r/, increased WOB on 2L [place] GI: BS+, NDNT, no masses noted, no organomegaly noted MSK: No e/c/c Skin: No rashes, bruises, ulcerations noted Neuro: A&O x 3, no focal deficits Psyc: Appropriate interaction and affect, calm/cooperative  Labs on Admission: I have personally reviewed following labs and imaging studies  CBC: Recent Labs  Lab 02/24/21 1310  WBC 8.5  NEUTROABS 4.4  HGB 14.2  HCT 46.0  MCV 83.5  PLT 644   Basic Metabolic Panel: Recent Labs  Lab 02/24/21 1310  NA 142  K 4.1  CL 107  CO2 27  GLUCOSE 105*  BUN 15  CREATININE 0.76  CALCIUM 8.6*   GFR: CrCl cannot be calculated (Unknown ideal weight.). Liver Function Tests: No results for input(s): AST, ALT, ALKPHOS, BILITOT, PROT, ALBUMIN in the last 168 hours. No results for input(s): LIPASE, AMYLASE in the last 168 hours. No results for input(s): AMMONIA in the last 168 hours. Coagulation Profile: No results for input(s): INR, PROTIME in the last 168 hours. Cardiac Enzymes: No results for input(s): CKTOTAL, CKMB, CKMBINDEX, TROPONINI in the last 168 hours. BNP (last 3 results) No results for input(s): PROBNP in the last 8760 hours. HbA1C: No results for input(s): HGBA1C in the last 72 hours. CBG: No results for input(s): GLUCAP in the last 168 hours. Lipid Profile: No results for input(s): CHOL, HDL, LDLCALC, TRIG, CHOLHDL, LDLDIRECT in the last 72 hours. Thyroid Function Tests: No results for input(s): TSH, T4TOTAL, FREET4, T3FREE, THYROIDAB in the last 72 hours. Anemia Panel: No results for input(s): VITAMINB12, FOLATE, FERRITIN, TIBC, IRON, RETICCTPCT in the last 72 hours. Urine analysis:    Component Value Date/Time   COLORURINE YELLOW 04/29/2019 0634   APPEARANCEUR CLOUDY (A) 04/29/2019 0634    LABSPEC 1.014 04/29/2019 [place] 7.0 04/29/2019 0634   GLUCOSEU NEGATIVE 04/29/2019 0634   GLUCOSEU NEGATIVE 07/13/2015 1058   HGBUR NEGATIVE 04/29/2019 [place] 04/29/2019 [place] 04/29/2019 0634   PROTEINUR NEGATIVE 04/29/2019 0634   UROBILINOGEN 0.2 07/13/2015 1058   NITRITE NEGATIVE 04/29/2019 0634   LEUKOCYTESUR NEGATIVE 04/29/2019 0634    Radiological Exams on Admission: DG Chest Port 1 View  Result Date: 02/24/2021 CLINICAL DATA:  Shortness of breath for 1 week. EXAM: PORTABLE CHEST 1 VIEW COMPARISON:  April 29, 2019 FINDINGS: The heart size and mediastinal contours are within normal limits. Both lungs are clear. The visualized skeletal structures are stable. IMPRESSION: No active disease. Electronically Signed   By: Abelardo Diesel M.D.   On: 02/24/2021 13:55    EKG: Independently reviewed. Sinus, no st elevations  Assessment/Plan Asthma exacerbation     -  admit to inpt, progressive     - just weaned from BiPap; continue O2 support     - continue steroids, nebs     - add guaifenesin, [facility]  HTN     - continue home regimen  Morbid obesity     - counsel on diet, lifestyle changes; follow up outpt  DVT prophylaxis: lovenox  Code Status: FULL  Family Communication: w/ family at bedside  Consults called: None   Status is: Inpatient  Remains inpatient appropriate because: severity of illness   Jonnie Finner DO [facility]  If 7PM-7AM, please contact night-coverage [email or web address]  02/24/2021, 3:42 PM

## 2021-02-24 NOTE — ED Provider Notes (Signed)
[name] DEPT Provider Note   CSN: [id number] Arrival date & time: 02/24/21  1244     History Chief Complaint  Patient presents with   Shortness of Breath    Linda Cabrera is a 65 y.o. female.  Patient is a 65 year old female with a history of anemia, renal insufficiency, hypertension and asthma who is presenting today with complaints of shortness of breath.  Patient reported to EMS for the last 1 week she has had worsening shortness of breath, wheezing and nonproductive cough.  She denies any chest pain, emesis, fever, abdominal pain or diarrhea.  She has not noticed any swelling in her lower extremities.  She denies any use of tobacco products recently and takes no blood thinners.  No known contacts with COVID or flu positive people.  When EMS arrived patient was in respiratory distress with tachypnea, hypoxia of 89% on room air and diffuse wheezing.  She was given 3 duo nebs with mild improvement but still had significant work of breathing upon arrival here.  They were unable to obtain an IV in route.  The history is provided by the patient.  Shortness of Breath     Past Medical History:  Diagnosis Date   Anemia, unspecified 07/13/2015   Arthritis    Asthma    Herpes    Hypertension     Patient Active Problem List   Diagnosis Date Noted   Pre-diabetes 02/10/2018   Anemia, unspecified 07/13/2015   Renal insufficiency 05/02/2015   Headache 05/02/2015   Routine general medical examination at a health care facility 06/15/2014   HSV-2 (herpes simplex virus 2) infection 06/19/2011   Severe obesity (BMI >= 40) ([place]) 06/11/2006   History of tobacco abuse 06/11/2006   HYPERTENSION, BENIGN SYSTEMIC 06/11/2006   Asthma 06/11/2006   OSTEOARTHRITIS, MULTI SITES 06/11/2006    Past Surgical History:  Procedure Laterality Date   BREATH TEK H PYLORI N/A 02/13/2014   Procedure: BREATH TEK H PYLORI;  Surgeon: Gayland Curry, MD;  Location: [facility];  Service: General;  Laterality: N/A;   CHOLECYSTECTOMY N/A 04/29/2019   Procedure: LAPAROSCOPIC CHOLECYSTECTOMY;  Surgeon: Mickeal Skinner, MD;  Location: [facility];  Service: General;  Laterality: N/A;   COLONOSCOPY N/A 08/15/2013   Procedure: COLONOSCOPY;  Surgeon: Jerene Bears, MD;  Location: [facility];  Service: Gastroenterology;  Laterality: N/A;   DILATATION & CURETTAGE/HYSTEROSCOPY WITH MYOSURE N/A 01/07/2021   Procedure: [place];  Surgeon: [name], MD;  Location: [facility];  Service: Gynecology;  Laterality: N/A;   TONSILLECTOMY     TUBAL LIGATION       OB History     Gravida  1   Para  1   Term  1   Preterm      AB      Living  1      SAB      IAB      Ectopic      Multiple      Live Births              Family History  Problem Relation Age of Onset   Arthritis Mother    Hypertension Mother    Cancer Mother        pancreatic   Pancreatic cancer Mother    Arthritis Father    Hypertension Father    Colon cancer Neg Hx     Social History   Tobacco Use  Smoking status: Former    Types: Cigarettes    Quit date: 06/16/2004    Years since quitting: 16.7   Smokeless tobacco: Never  Vaping Use   Vaping Use: Never used  Substance Use Topics   Alcohol use: Yes    Comment: Occ   Drug use: No    Comment: remote h/o crack    Home Medications Prior to Admission medications   Medication Sig Start Date End Date Taking? Authorizing Provider  albuterol (VENTOLIN HFA) 108 (90 Base) MCG/ACT inhaler INHALE TWO PUFFS INTO THE LUNGS EVERY 6 HOURS AS NEEDED FOR WHEEZING OR SHORTNESS OF BREATH 08/01/20   Hoyt Koch, MD  aspirin EC 81 MG tablet Take 1 tablet (81 mg total) by mouth daily. 07/11/19   Hoyt Koch, MD  docusate sodium (COLACE) 100 MG capsule Take 1 capsule (100 mg total) by mouth 2 (two) times daily. Patient taking differently: Take 100 mg by mouth daily as needed  for mild constipation. 08/19/19   Hoyt Koch, MD  furosemide (LASIX) 40 MG tablet Take 1 tablet (40 mg total) by mouth daily. 07/05/20   Hoyt Koch, MD  montelukast (SINGULAIR) 10 MG tablet TAKE 1 TABLET BY MOUTH EVERYDAY AT BEDTIME 01/10/20   Hoyt Koch, MD  valACYclovir (VALTREX) 1000 MG tablet TAKE 1 TABLET BY MOUTH EVERY DAY 08/02/20   Hoyt Koch, MD    Allergies    Ace inhibitors  Review of Systems   Review of Systems  Respiratory:  Positive for shortness of breath.   All other systems reviewed and are negative.  Physical Exam Updated Vital Signs BP (!) 144/105 (BP Location: Left Wrist)   Pulse 98   Temp 98.5 F (36.9 C) (Oral)   Resp 16   LMP 09/11/2020   SpO2 100%   Physical Exam Vitals and nursing note reviewed.  Constitutional:      General: She is in acute distress.     Appearance: She is well-developed.  HENT:     Head: Normocephalic and atraumatic.  Eyes:     Pupils: Pupils are equal, round, and reactive to light.  Cardiovascular:     Rate and Rhythm: Normal rate and regular rhythm.     Heart sounds: Normal heart sounds. No murmur heard.   No friction rub.  Pulmonary:     Effort: Tachypnea and accessory muscle usage present.     Breath sounds: Wheezing present. No rales.  Abdominal:     General: Bowel sounds are normal. There is no distension.     Palpations: Abdomen is soft.     Tenderness: There is no abdominal tenderness. There is no guarding or rebound.  Musculoskeletal:        General: No tenderness. Normal range of motion.     Cervical back: Normal range of motion and neck supple.     Right lower leg: No edema.     Left lower leg: No edema.     Comments: No edema  Skin:    General: Skin is warm and dry.     Findings: No rash.  Neurological:     Mental Status: She is alert and oriented to person, place, and time.     Cranial Nerves: No cranial nerve deficit.  Psychiatric:        Behavior: Behavior normal.     ED Results / Procedures / Treatments   Labs (all labs ordered are listed, but only abnormal results are displayed) Labs Reviewed  CBC  WITH DIFFERENTIAL/PLATELET - Abnormal; Notable for the following components:      Result Value   RBC 5.51 (*)    MCH 25.8 (*)    RDW 19.6 (*)    All other components within normal limits  BASIC METABOLIC PANEL - Abnormal; Notable for the following components:   Glucose, Bld 105 (*)    Calcium 8.6 (*)    All other components within normal limits  BLOOD GAS, VENOUS - Abnormal; Notable for the following components:   pO2, Ven 136.0 (*)    All other components within normal limits  RESP PANEL BY RT-PCR (FLU A&B, COVID) [id number]    EKG EKG Interpretation  Date/Time:  Sunday February 24 2021 12:58:38 EST Ventricular Rate:  93 PR Interval:  162 QRS Duration: 86 QT Interval:  376 QTC Calculation: 468 R Axis:   22 Text Interpretation: Sinus rhythm Abnormal R-wave progression, early transition No significant change since last tracing Confirmed by Blanchie Dessert ([id number]) on 02/24/2021 1:36:15 PM  Radiology DG Chest Port 1 View  Result Date: 02/24/2021 CLINICAL DATA:  Shortness of breath for 1 week. EXAM: PORTABLE CHEST 1 VIEW COMPARISON:  April 29, 2019 FINDINGS: The heart size and mediastinal contours are within normal limits. Both lungs are clear. The visualized skeletal structures are stable. IMPRESSION: No active disease. Electronically Signed   By: Abelardo Diesel M.D.   On: 02/24/2021 13:55    Procedures Procedures   Medications Ordered in ED Medications  methylPREDNISolone sodium succinate (SOLU-MEDROL) 125 mg/2 mL injection 125 mg (has no administration in time range)  magnesium sulfate IVPB 2 g 50 mL (has no administration in time range)  albuterol (PROVENTIL,VENTOLIN) solution continuous neb (has no administration in time range)    ED Course  I have reviewed the triage vital signs and the nursing notes.  Pertinent labs & imaging  results that were available during my care of the patient were reviewed by me and considered in my medical decision making (see chart for details).    MDM Rules/Calculators/A&P                           Patient is a 65 year old female presenting today with respiratory distress.  Patient is wheezing diffusely and does have history of asthma.  She did report URI symptoms over the last week.  Concern for infectious etiology versus asthma exacerbation.  She has no prior history of heart disease no evidence of fluid overload today.  Lower suspicion for PE at this time.  COVID, labs are pending.  Patient received 3 DuoNeb's prior to arrival and still has tachypnea, increased work of breathing and diffuse wheezing.  We will start BiPAP, continuous albuterol neb and will get IV access and give magnesium and Solu-Medrol.  2:32 PM After 1 hour on BiPAP and a continuous albuterol neb patient's wheezing has significantly improved.  Work of breathing has improved.  She also received Solu-Medrol and magnesium.  We will attempt to wean BiPAP.  VBG without acute findings, CBC and BMP are reassuring.  Chest x-ray without acute findings.  3:13 PM Patient is weaned from BiPAP but is still having wheezing.  Currently on nasal cannula but feel needs admission for ongoing asthma exacerbation treatment  MDM   Amount and/or Complexity of Data Reviewed Clinical lab tests: ordered and reviewed Tests in the radiology section of CPT: ordered and reviewed Tests in the medicine section of CPT: ordered and reviewed Decide to obtain previous medical  records or to obtain history from someone other than the patient: yes Obtain history from someone other than the patient: yes Review and summarize past medical records: yes Discuss the patient with other providers: yes Independent visualization of images, tracings, or specimens: yes   CRITICAL CARE Performed by: Shayana Hornstein Total critical care time: 30  minutes Critical care time was exclusive of separately billable procedures and treating other patients. Critical care was necessary to treat or prevent imminent or life-threatening deterioration. Critical care was time spent personally by me on the following activities: development of treatment plan with patient and/or surrogate as well as nursing, discussions with consultants, evaluation of patient's response to treatment, examination of patient, obtaining history from patient or surrogate, ordering and performing treatments and interventions, ordering and review of laboratory studies, ordering and review of radiographic studies, pulse oximetry and re-evaluation of patient's condition.   Final Clinical Impression(s) / ED Diagnoses Final diagnoses:  Severe persistent asthma with exacerbation  Acute respiratory failure with hypoxia [facility])    Rx / DC Orders ED Discharge Orders     None        Blanchie Dessert, MD 02/24/21 1514

## 2021-02-24 NOTE — Procedures (Signed)
Patient was placed on BIPAP due to respiratory distress at 1315. She was given a cont. Neb tx in which she tolerated well. At 1457 the patient was able to come off BIPAP and appears to remain stable at this time. RT will continue to monitor patient status

## 2021-02-25 DIAGNOSIS — G44201 Tension-type headache, unspecified, intractable: Secondary | ICD-10-CM

## 2021-02-25 DIAGNOSIS — R7303 Prediabetes: Secondary | ICD-10-CM

## 2021-02-25 DIAGNOSIS — I10 Essential (primary) hypertension: Secondary | ICD-10-CM

## 2021-02-25 LAB — CBC
HCT: 44.2 % (ref 36.0–46.0)
Hemoglobin: 13.6 g/dL (ref 12.0–15.0)
MCH: 25.8 pg — ABNORMAL LOW (ref 26.0–34.0)
MCHC: 30.8 g/dL (ref 30.0–36.0)
MCV: 83.7 fL (ref 80.0–100.0)
Platelets: 208 10*3/uL (ref 150–400)
RBC: 5.28 MIL/uL — ABNORMAL HIGH (ref 3.87–5.11)
RDW: 19.6 % — ABNORMAL HIGH (ref 11.5–15.5)
WBC: 7.4 10*3/uL (ref 4.0–10.5)
nRBC: 0 % (ref 0.0–0.2)

## 2021-02-25 LAB — COMPREHENSIVE METABOLIC PANEL
ALT: 17 U/L (ref 0–44)
AST: 21 U/L (ref 15–41)
Albumin: 3.7 g/dL (ref 3.5–5.0)
Alkaline Phosphatase: 97 U/L (ref 38–126)
Anion gap: 12 (ref 5–15)
BUN: 17 mg/dL (ref 8–23)
CO2: 23 mmol/L (ref 22–32)
Calcium: 8.9 mg/dL (ref 8.9–10.3)
Chloride: 104 mmol/L (ref 98–111)
Creatinine, Ser: 0.78 mg/dL (ref 0.44–1.00)
GFR, Estimated: >60 mL/min (ref >60)
Glucose, Bld: 206 mg/dL — ABNORMAL HIGH (ref 70–99)
Potassium: 4 mmol/L (ref 3.5–5.1)
Sodium: 139 mmol/L (ref 135–145)
Total Bilirubin: 0.4 mg/dL (ref 0.3–1.2)
Total Protein: 7.7 g/dL (ref 6.5–8.1)

## 2021-02-25 LAB — HIV ANTIBODY (ROUTINE TESTING W REFLEX): HIV Screen 4th Generation wRfx: NONREACTIVE

## 2021-02-25 MED ORDER — SENNOSIDES-DOCUSATE SODIUM 8.6-50 MG PO TABS
1.0000 | ORAL_TABLET | Freq: Two times a day (BID) | ORAL | Status: DC
  Administered 2021-02-25 – 2021-02-26 (×3): 1 via ORAL
  Filled 2021-02-25 (×3): qty 1

## 2021-02-25 MED ORDER — OXYCODONE HCL 5 MG PO TABS: 5.0000 mg | ORAL_TABLET | Freq: Four times a day (QID) | ORAL | Status: DC | PRN

## 2021-02-25 MED ORDER — IPRATROPIUM-ALBUTEROL 0.5-2.5 (3) MG/3ML IN SOLN
3.0000 mL | Freq: Three times a day (TID) | RESPIRATORY_TRACT | Status: DC
  Administered 2021-02-26: 3 mL via RESPIRATORY_TRACT
  Filled 2021-02-25: qty 3

## 2021-02-25 MED ORDER — TRAMADOL HCL 50 MG PO TABS
50.0000 mg | ORAL_TABLET | Freq: Once | ORAL | Status: AC
  Administered 2021-02-25: 50 mg via ORAL
  Filled 2021-02-25: qty 1

## 2021-02-25 MED ORDER — HYDRALAZINE HCL 25 MG PO TABS: 25.0000 mg | ORAL_TABLET | Freq: Four times a day (QID) | ORAL | Status: DC | PRN

## 2021-02-25 MED ORDER — FUROSEMIDE 40 MG PO TABS
40.0000 mg | ORAL_TABLET | Freq: Every day | ORAL | Status: DC
  Administered 2021-02-25 – 2021-02-26 (×2): 40 mg via ORAL
  Filled 2021-02-25 (×2): qty 1

## 2021-02-25 MED ORDER — AMLODIPINE BESYLATE 5 MG PO TABS
5.0000 mg | ORAL_TABLET | Freq: Every day | ORAL | Status: DC
  Administered 2021-02-25: 5 mg via ORAL
  Filled 2021-02-25: qty 1

## 2021-02-25 MED ORDER — ALBUTEROL SULFATE (2.5 MG/3ML) 0.083% IN NEBU: 2.5000 mg | INHALATION_SOLUTION | RESPIRATORY_TRACT | Status: DC | PRN

## 2021-02-25 NOTE — Progress Notes (Signed)
PROGRESS NOTE    Linda Cabrera  OVF:[id number] DOB: 1956/03/07 DOA: 02/24/2021 PCP: Hoyt Koch, MD    Brief Narrative:  Linda Cabrera is a 65 year old female with past medical history significant for asthma, essential hypertension, morbid obesity who presents to [facility] ED on 11/13 with progressive dyspnea x1 week.  Patient reports has been becoming more short of breath and using her albuterol inhaler more frequently also with associated nonproductive cough.  Patient also reports increasing headache and inability to catch her breath.  Family became worried and activated EMS for further evaluation.  On EMS arrival, she was given duo nebs and due to her respiratory distress was placed on BiPAP on transport to [facility] long emergency department.  In the ED, 98.5 F, HR 96, RR 15, BP 144/105, SPO2 100% on BiPAP with 35% FiO2.  VBG with pH 7.36, PCO2 47.9, PO2 136.0.  Sodium 142, potassium 4.1, chloride 107, CO2 27, glucose 105, BUN 15, creatinine 0.76.  WBC 8.5, hemoglobin 14.2, platelets 217.  COVID-19 PCR negative.  Influenza A/B PCR negative.  Chest x-ray no acute cardiopulmonary disease process.  Patient was given steroids, weaned off of BiPAP in the ED.  Hospital service consulted for further evaluation management of acute asthma exacerbation.   Assessment & Plan:   Principal Problem:   Asthma exacerbation Active Problems:   Severe obesity (BMI >= 40) ([facility])   HYPERTENSION, BENIGN SYSTEMIC   Headache   Pre-diabetes   Asthma exacerbation Patient presenting with progressive shortness of breath over the last week and utilizing her home albuterol inhaler more frequently.  Not on any other controlling medications at baseline.  No previous history of hospitalizations related to asthma in the past.  Denies any sick contacts.  COVID/flu PCR negative.  Chest x-ray with no active cardiopulmonary disease process.  Afebrile without leukocytosis.  Initially requiring BiPAP with EMS which was  titrated off in the ED. --Solu-Medrol 60 mg IV q24h x 1 day followed by prednisone 40 mg p.o. daily --DuoNebs every 6 hours --Mucinex 600 mg p.o. twice daily --Continue supple oxygen, maintain SPO2 > 92%; on 2 L nasal cannula --Ambulatory O2 screen today  Essential hypertension Blood pressure up to 176/101, poorly controlled.  On Lasix 40 mg p.o. daily at baseline.  Per patient states previously on HCTZ but was discontinued. --Continue furosemide 40 mg p.o. daily --Start amlodipine 5 mg p.o. daily --Hydralazine 25 mg p.o. q6h PRN SBP >170 or DBP >110 -- Continue monitor BP closely and will adjust antihypertensives as needed  Headache Patient complaining of headache this morning, no history of migraine headache.  Etiology likely secondary to poorly controlled blood pressure as above. --Blood pressure management as above --Tylenol, oxycodone, morphine as needed  Prediabetes Hemoglobin A1c 6.0 04/03/2020.  Outpatient follow-up with PCP.  Morbid obesity Body mass index is 61.07 kg/m.  Discussed with patient needs for aggressive lifestyle changes/weight loss as this complicates all facets of care.  Outpatient follow-up with PCP.  May benefit from bariatric evaluation outpatient.  DVT prophylaxis: enoxaparin (LOVENOX) injection 40 mg Start: 02/24/21 2200   Code Status: Full Code Family Communication: Updated patient spouse present at bedside this morning  Disposition Plan:  Level of care: Progressive Status is: Inpatient  Remains inpatient appropriate because: Continues on IV steroids, weaning supplemental oxygen, needs better blood pressure control   Consultants:  None  Procedures:  BiPAP  Antimicrobials:  None   Subjective: Patient seen examined bedside, resting calmly.  Complaining of headache.  Spouse  present at bedside.  States no previous history of migraines.  Discussed her poorly controlled blood pressure, only on furosemide outpatient.  Continues with some mild  shortness of breath, improved since ED presentation; but remains on submental oxygen.  No other questions or concerns at this time.  Denies visual changes, no chest pain, no palpitations, no abdominal pain, no fever/chills/night sweats, no nausea/vomiting/diarrhea, no weakness, no fatigue, no paresthesias.  No acute events overnight per nursing staff.  Objective: Vitals:   02/25/21 0149 02/25/21 0205 02/25/21 0600 02/25/21 0941  BP: (!) 177/107  (!) 174/101   Pulse: 99  93   Resp: 20  16   Temp: 98 F (36.7 C)  98 F (36.7 C)   TempSrc: Oral  Oral   SpO2: 95% 97% 91% 93%  Weight:      Height:        Intake/Output Summary (Last 24 hours) at 02/25/2021 1301 Last data filed at 02/24/2021 1839 Gross per 24 hour  Intake 45.85 ml  Output --  Net 45.85 ml   Filed Weights   02/24/21 1736  Weight: (!) 146.6 kg    Examination:  General exam: Appears calm and comfortable, obese Respiratory system: Clear to auscultation. Respiratory effort normal.  On 2 L nasal cannula Cardiovascular system: S1 & S2 heard, RRR. No JVD, murmurs, rubs, gallops or clicks. No pedal edema. Gastrointestinal system: Abdomen is nondistended, soft and nontender. No organomegaly or masses felt. Normal bowel sounds heard. Central nervous system: Alert and oriented. No focal neurological deficits. Extremities: Symmetric 5 x 5 power. Skin: No rashes, lesions or ulcers Psychiatry: Judgement and insight appear normal. Mood & affect appropriate.     Data Reviewed: I have personally reviewed following labs and imaging studies  CBC: Recent Labs  Lab 02/24/21 1310 02/25/21 0404  WBC 8.5 7.4  NEUTROABS 4.4  --   HGB 14.2 13.6  HCT 46.0 44.2  MCV 83.5 83.7  PLT 217 412   Basic Metabolic Panel: Recent Labs  Lab 02/24/21 1310 02/25/21 0404  NA 142 139  K 4.1 4.0  CL 107 104  CO2 27 23  GLUCOSE 105* 206*  BUN 15 17  CREATININE 0.76 0.78  CALCIUM 8.6* 8.9   GFR: Estimated Creatinine Clearance: 99.2  mL/min (by C-G formula based on SCr of 0.78 mg/dL). Liver Function Tests: Recent Labs  Lab 02/25/21 0404  AST 21  ALT 17  ALKPHOS 97  BILITOT 0.4  PROT 7.7  ALBUMIN 3.7   No results for input(s): LIPASE, AMYLASE in the last 168 hours. No results for input(s): AMMONIA in the last 168 hours. Coagulation Profile: No results for input(s): INR, PROTIME in the last 168 hours. Cardiac Enzymes: No results for input(s): CKTOTAL, CKMB, CKMBINDEX, TROPONINI in the last 168 hours. BNP (last 3 results) No results for input(s): PROBNP in the last 8760 hours. HbA1C: No results for input(s): HGBA1C in the last 72 hours. CBG: No results for input(s): GLUCAP in the last 168 hours. Lipid Profile: No results for input(s): CHOL, HDL, LDLCALC, TRIG, CHOLHDL, LDLDIRECT in the last 72 hours. Thyroid Function Tests: No results for input(s): TSH, T4TOTAL, FREET4, T3FREE, THYROIDAB in the last 72 hours. Anemia Panel: No results for input(s): VITAMINB12, FOLATE, FERRITIN, TIBC, IRON, RETICCTPCT in the last 72 hours. Sepsis Labs: No results for input(s): PROCALCITON, LATICACIDVEN in the last 168 hours.  Recent Results (from the past 240 hour(s))  Resp Panel by RT-PCR (Flu A&B, Covid) Nasopharyngeal Swab     Status:  None   Collection Time: 02/24/21  1:10 PM   Specimen: Nasopharyngeal Swab; Nasopharyngeal(NP) swabs in vial transport medium  Result Value Ref Range Status   SARS Coronavirus 2 by RT PCR NEGATIVE NEGATIVE Final    Comment: (NOTE) SARS-CoV-2 target nucleic acids are NOT DETECTED.  The SARS-CoV-2 RNA is generally detectable in upper respiratory specimens during the acute phase of infection. The lowest concentration of SARS-CoV-2 viral copies this assay can detect is 138 copies/mL. A negative result does not preclude SARS-Cov-2 infection and should not be used as the sole basis for treatment or other patient management decisions. A negative result may occur with  improper specimen  collection/handling, submission of specimen other than nasopharyngeal swab, presence of viral mutation(s) within the areas targeted by this assay, and inadequate number of viral copies(<138 copies/mL). A negative result must be combined with clinical observations, patient history, and epidemiological information. The expected result is Negative.  Fact Sheet for Patients:  EntrepreneurPulse.com.au  Fact Sheet for Healthcare Providers:  IncredibleEmployment.be  This test is no t yet approved or cleared by the [place] FDA and  has been authorized for detection and/or diagnosis of SARS-CoV-2 by FDA under an Emergency Use Authorization (EUA). This EUA will remain  in effect (meaning this test can be used) for the duration of the COVID-19 declaration under Section 564(b)(1) of the Act, 21 U.S.C.section 360bbb-3(b)(1), unless the authorization is terminated  or revoked sooner.       Influenza A by PCR NEGATIVE NEGATIVE Final   Influenza B by PCR NEGATIVE NEGATIVE Final    Comment: (NOTE) The Xpert Xpress SARS-CoV-2/FLU/RSV plus assay is intended as an aid in the diagnosis of influenza from Nasopharyngeal swab specimens and should not be used as a sole basis for treatment. Nasal washings and aspirates are unacceptable for Xpert Xpress SARS-CoV-2/FLU/RSV testing.  Fact Sheet for Patients: EntrepreneurPulse.com.au  Fact Sheet for Healthcare Providers: IncredibleEmployment.be  This test is not yet approved or cleared by the [place] FDA and has been authorized for detection and/or diagnosis of SARS-CoV-2 by FDA under an Emergency Use Authorization (EUA). This EUA will remain in effect (meaning this test can be used) for the duration of the COVID-19 declaration under Section 564(b)(1) of the Act, 21 U.S.C. section 360bbb-3(b)(1), unless the authorization is terminated or revoked.  Performed at [facility], [place] [address], [place], [place] [address]          Radiology Studies: [facility] Chest Port 1 View  Result Date: 02/24/2021 CLINICAL DATA:  Shortness of breath for 1 week. EXAM: PORTABLE CHEST 1 VIEW COMPARISON:  April 29, 2019 FINDINGS: The heart size and mediastinal contours are within normal limits. Both lungs are clear. The visualized skeletal structures are stable. IMPRESSION: No active disease. Electronically Signed   By: Abelardo Diesel M.D.   On: 02/24/2021 13:55        Scheduled Meds:  amLODipine  5 mg Oral Daily   enoxaparin (LOVENOX) injection  40 mg Subcutaneous Q24H   furosemide  40 mg Oral Daily   guaiFENesin  600 mg Oral BID   ipratropium-albuterol  3 mL Nebulization Q6H   methylPREDNISolone (SOLU-MEDROL) injection  60 mg Intravenous Q12H   Followed by   Derrill Memo ON 02/26/2021] predniSONE  40 mg Oral Q breakfast   senna-docusate  1 tablet Oral BID   Continuous Infusions:   LOS: 1 day    Time spent: 42 minutes spent on chart review, discussion with nursing staff, consultants, updating family  and interview/physical exam; more than 50% of that time was spent in counseling and/or coordination of care.    Bhavin Monjaraz J [place] DO [facility] Available via Epic secure chat 7am-7pm After these hours, please refer to coverage provider listed on [email or web address] 02/25/2021, 1:01 PM

## 2021-02-26 DIAGNOSIS — R519 Headache, unspecified: Secondary | ICD-10-CM

## 2021-02-26 MED ORDER — IPRATROPIUM-ALBUTEROL 0.5-2.5 (3) MG/3ML IN SOLN
3.0000 mL | Freq: Four times a day (QID) | RESPIRATORY_TRACT | 0 refills | Status: AC | PRN
Start: 2021-02-26 — End: ?

## 2021-02-26 MED ORDER — AMLODIPINE BESYLATE 10 MG PO TABS
10.0000 mg | ORAL_TABLET | Freq: Every day | ORAL | Status: DC
  Administered 2021-02-26: 10 mg via ORAL
  Filled 2021-02-26: qty 1

## 2021-02-26 MED ORDER — PREDNISONE 20 MG PO TABS: 40.0000 mg | ORAL_TABLET | Freq: Every day | ORAL | 0 refills | Status: AC

## 2021-02-26 MED ORDER — AMLODIPINE BESYLATE 10 MG PO TABS: 10.0000 mg | ORAL_TABLET | Freq: Every day | ORAL | 2 refills | Status: DC

## 2021-02-26 MED ORDER — UMECLIDINIUM-VILANTEROL 62.5-25 MCG/ACT IN AEPB: 1.0000 | INHALATION_SPRAY | Freq: Every day | RESPIRATORY_TRACT | 2 refills | Status: DC

## 2021-02-26 NOTE — TOC Progression Note (Signed)
Transition of Care [facility]) - Progression Note    Patient Details  Name: Linda Cabrera MRN: [id number] Date of Birth: 30-Sep-1955  Transition of Care [facility]) CM/SW Contact  Purcell Mouton, RN Phone Number: 02/26/2021, 12:35 PM  Clinical Narrative:    Neb machine was delivered to pt's room at 1158 AM. Pt will discharge home with no needs.    Expected Discharge Plan: Home/Self Care Barriers to Discharge: No Barriers Identified  Expected Discharge Plan and Services Expected Discharge Plan: Home/Self Care       Living arrangements for the past 2 months: Single Family Home Expected Discharge Date: 02/26/21                                     Social Determinants of Health (SDOH) Interventions    Readmission Risk Interventions No flowsheet data found.

## 2021-02-26 NOTE — Progress Notes (Signed)
D/C instruction given to pt. IV take out and pt accompanied out by Nurse tech.

## 2021-02-26 NOTE — Discharge Summary (Signed)
Physician Discharge Summary  [name] GLO:[id number] DOB: 11/26/1955 DOA: 02/24/2021  PCP: Hoyt Koch, MD  Admit date: 02/24/2021 Discharge date: 02/26/2021  Admitted From: Home Disposition: Home  Recommendations for Outpatient Follow-up:  Follow up with PCP in 1-2 weeks Continue prednisone to complete 5-day course Started on Anoro Ellipta inhaler Discharged with nebulizer machine, duo nebs to use as needed for shortness of breath/wheezing Started on amlodipine 10 mg p.o. daily for poorly controlled hypertension Needs close follow-up with PCP for further monitoring of blood pressure and titration of antihypertensive regimen  Home Health: No Equipment/Devices: Home nebulizer unit  Discharge Condition: Stable CODE STATUS: Full code Diet recommendation: Heart healthy/consistent carb regular diet  History of present illness:  Linda Cabrera is a 65 year old female with past medical history significant for asthma, essential hypertension, morbid obesity who presents to [facility] ED on 11/13 with progressive dyspnea x1 week.  Patient reports has been becoming more short of breath and using her albuterol inhaler more frequently also with associated nonproductive cough.  Patient also reports increasing headache and inability to catch her breath.  Family became worried and activated EMS for further evaluation.  On EMS arrival, she was given duo nebs and due to her respiratory distress was placed on BiPAP on transport to [facility] long emergency department.   In the ED, 98.5 F, HR 96, RR 15, BP 144/105, SPO2 100% on BiPAP with 35% FiO2.  VBG with pH 7.36, PCO2 47.9, PO2 136.0.  Sodium 142, potassium 4.1, chloride 107, CO2 27, glucose 105, BUN 15, creatinine 0.76.  WBC 8.5, hemoglobin 14.2, platelets 217.  COVID-19 PCR negative.  Influenza A/B PCR negative.  Chest x-ray no acute cardiopulmonary disease process.  Patient was given steroids, weaned off of BiPAP in the ED.  Hospital service  consulted for further evaluation management of acute asthma exacerbation.  Hospital course:  Asthma exacerbation Patient presenting with progressive shortness of breath over the last week and utilizing her home albuterol inhaler more frequently.  Not on any other controlling medications at baseline.  No previous history of hospitalizations related to asthma in the past.  Denies any sick contacts.  COVID/flu PCR negative.  Chest x-ray with no active cardiopulmonary disease process.  Afebrile without leukocytosis.  Initially requiring BiPAP with EMS which was titrated off in the ED. patient was started on IV steroids and transition to oral prednisone.  Continued with neb treatments while inpatient with improvement of symptoms and was titrated off of supplemental.  We will continue prednisone to complete 5-day course on discharge.  Started on [employer] inhaler on discharge.  Patient was also given home nebulizer unit to use as needed with duo nebs for shortness of breath/wheezing.  Outpatient follow-up with PCP.   Essential hypertension Blood pressure up to 176/101, poorly controlled.  On Lasix 40 mg p.o. daily at baseline.  Per patient states previously on HCTZ but was discontinued.  Continue home furosemide 40 mg p.o. daily.  Started on amlodipine 10 mg p.o. daily.  Outpatient follow-up with PCP for further guidance regarding antihypertensive titration.   Headache: Resolved Patient complaining of headache this morning, no history of migraine headache.  Etiology likely secondary to poorly controlled blood pressure as above.   Prediabetes Hemoglobin A1c 6.0 04/03/2020.  Outpatient follow-up with PCP.   Morbid obesity Body mass index is 61.07 kg/m.  Discussed with patient needs for aggressive lifestyle changes/weight loss as this complicates all facets of care.  Outpatient follow-up with PCP.  May benefit  from bariatric evaluation outpatient.  Discharge Diagnoses:  Principal Problem:   Asthma  exacerbation Active Problems:   Severe obesity (BMI >= 40) (HCC)   HYPERTENSION, BENIGN SYSTEMIC   Pre-diabetes    Discharge Instructions  Discharge Instructions     Call MD for:  difficulty breathing, headache or visual disturbances   Complete by: As directed    Call MD for:  extreme fatigue   Complete by: As directed    Call MD for:  persistant dizziness or light-headedness   Complete by: As directed    Call MD for:  persistant nausea and vomiting   Complete by: As directed    Call MD for:  severe uncontrolled pain   Complete by: As directed    Call MD for:  temperature >100.4   Complete by: As directed    Diet - low sodium heart healthy   Complete by: As directed    Increase activity slowly   Complete by: As directed       Allergies as of 02/26/2021       Reactions   Ace Inhibitors Swelling   Mouth swelling        Medication List     STOP taking these medications    docusate sodium 100 MG capsule Commonly known as: Colace       TAKE these medications    albuterol 108 (90 Base) MCG/ACT inhaler Commonly known as: VENTOLIN HFA INHALE TWO PUFFS INTO THE LUNGS EVERY 6 HOURS AS NEEDED FOR WHEEZING OR SHORTNESS OF BREATH What changed: See the new instructions.   ALKA SELTZER PLUS PO Take 2 tablets by mouth daily as needed (congestion).   amLODipine 10 MG tablet Commonly known as: NORVASC Take 1 tablet (10 mg total) by mouth daily. Start taking on: February 27, 2021   aspirin EC 81 MG tablet Take 1 tablet (81 mg total) by mouth daily.   aspirin-acetaminophen-caffeine 250-250-65 MG tablet Commonly known as: EXCEDRIN MIGRAINE Take 2 tablets by mouth daily as needed for headache.   Dulcolax Milk of Magnesia 400 MG/5ML suspension Generic drug: magnesium hydroxide Take 22.5 mLs by mouth daily as needed (constipation). Dulcolax Liquid   furosemide 40 MG tablet Commonly known as: LASIX Take 1 tablet (40 mg total) by mouth daily.    ipratropium-albuterol 0.5-2.5 (3) MG/3ML Soln Commonly known as: DUONEB Take 3 mLs by nebulization every 6 (six) hours as needed (shortness of breath, wheezing).   predniSONE 20 MG tablet Commonly known as: DELTASONE Take 2 tablets (40 mg total) by mouth daily with breakfast for 3 days. Start taking on: February 27, 2021   umeclidinium-vilanterol 62.5-25 MCG/ACT Aepb Commonly known as: ANORO ELLIPTA Inhale 1 puff into the lungs daily.   valACYclovir 1000 MG tablet Commonly known as: VALTREX TAKE 1 TABLET BY MOUTH EVERY DAY What changed: when to take this               Durable Medical Equipment  (From admission, onward)           Start     Ordered   02/26/21 1043  For home use only DME Nebulizer machine  Once       Question Answer Comment  Patient needs a nebulizer to treat with the following condition Asthma   Length of Need Lifetime      02/26/21 1042            Follow-up Information     Hoyt Koch, MD. Schedule an appointment as soon as possible  for a visit in 1 week(s).   Specialty: Internal Medicine Contact information: [place] [address] [phone]                Allergies  Allergen Reactions   Ace Inhibitors Swelling    Mouth swelling    Consultations: None   Procedures/Studies: DG Chest Port 1 View  Result Date: 02/24/2021 CLINICAL DATA:  Shortness of breath for 1 week. EXAM: PORTABLE CHEST 1 VIEW COMPARISON:  April 29, 2019 FINDINGS: The heart size and mediastinal contours are within normal limits. Both lungs are clear. The visualized skeletal structures are stable. IMPRESSION: No active disease. Electronically Signed   By: Abelardo Diesel M.D.   On: 02/24/2021 13:55     Subjective: Patient seen examined bedside, resting comfortably.  No complaints this morning.  Ready for discharge home.  Titrated off of oxygen.  Husband present at bedside.  Requesting home nebulizer machine.  No other complaints  or concerns at this time.  Denies headache, no chest pain, palpitations, no shortness of breath, no abdominal pain, no weakness, no fatigue, no paresthesias.  No acute events overnight per nursing staff.  Discharge Exam: Vitals:   02/26/21 0955 02/26/21 1022  BP: (!) 151/98 (!) 172/103  Pulse: 86 76  Resp: 19 18  Temp: 98.5 F (36.9 C) (!) 97.4 F (36.3 C)  SpO2: 100% 95%   Vitals:   02/25/21 2117 02/26/21 0849 02/26/21 0955 02/26/21 1022  BP:   (!) 151/98 (!) 172/103  Pulse:   86 76  Resp:   19 18  Temp:   98.5 F (36.9 C) (!) 97.4 F (36.3 C)  TempSrc:   Oral Oral  SpO2: 96% 96% 100% 95%  Weight:      Height:        General: Pt is alert, awake, not in acute distress, obese Cardiovascular: RRR, S1/S2 +, no rubs, no gallops Respiratory: CTA bilaterally, no wheezing, no rhonchi, on room air Abdominal: Soft, NT, ND, bowel sounds + Extremities: no edema, no cyanosis    The results of significant diagnostics from this hospitalization (including imaging, microbiology, ancillary and laboratory) are listed below for reference.     Microbiology: Recent Results (from the past 240 hour(s))  Resp Panel by RT-PCR (Flu A&B, Covid) Nasopharyngeal Swab     Status: None   Collection Time: 02/24/21  1:10 PM   Specimen: Nasopharyngeal Swab; Nasopharyngeal(NP) swabs in vial transport medium  Result Value Ref Range Status   SARS Coronavirus 2 by RT PCR NEGATIVE NEGATIVE Final    Comment: (NOTE) SARS-CoV-2 target nucleic acids are NOT DETECTED.  The SARS-CoV-2 RNA is generally detectable in upper respiratory specimens during the acute phase of infection. The lowest concentration of SARS-CoV-2 viral copies this assay can detect is 138 copies/mL. A negative result does not preclude SARS-Cov-2 infection and should not be used as the sole basis for treatment or other patient management decisions. A negative result may occur with  improper specimen collection/handling, submission of  specimen other than nasopharyngeal swab, presence of viral mutation(s) within the areas targeted by this assay, and inadequate number of viral copies(<138 copies/mL). A negative result must be combined with clinical observations, patient history, and epidemiological information. The expected result is Negative.  Fact Sheet for Patients:  EntrepreneurPulse.com.au  Fact Sheet for Healthcare Providers:  IncredibleEmployment.be  This test is no t yet approved or cleared by the [place] FDA and  has been authorized for detection and/or diagnosis of SARS-CoV-2  by FDA under an Emergency Use Authorization (EUA). This EUA will remain  in effect (meaning this test can be used) for the duration of the COVID-19 declaration under Section 564(b)(1) of the Act, 21 U.S.C.section 360bbb-3(b)(1), unless the authorization is terminated  or revoked sooner.       Influenza A by PCR NEGATIVE NEGATIVE Final   Influenza B by PCR NEGATIVE NEGATIVE Final    Comment: (NOTE) The Xpert Xpress SARS-CoV-2/FLU/RSV plus assay is intended as an aid in the diagnosis of influenza from Nasopharyngeal swab specimens and should not be used as a sole basis for treatment. Nasal washings and aspirates are unacceptable for Xpert Xpress SARS-CoV-2/FLU/RSV testing.  Fact Sheet for Patients: EntrepreneurPulse.com.au  Fact Sheet for Healthcare Providers: IncredibleEmployment.be  This test is not yet approved or cleared by the [place] FDA and has been authorized for detection and/or diagnosis of SARS-CoV-2 by FDA under an Emergency Use Authorization (EUA). This EUA will remain in effect (meaning this test can be used) for the duration of the COVID-19 declaration under Section 564(b)(1) of the Act, 21 U.S.C. section 360bbb-3(b)(1), unless the authorization is terminated or revoked.  Performed at [facility], [place]  [address] [place], [place] [address]      Labs: BNP (last 3 results) No results for input(s): BNP in the last 8760 hours. Basic Metabolic Panel: Recent Labs  Lab 02/24/21 1310 02/25/21 0404  NA 142 139  K 4.1 4.0  CL 107 104  CO2 27 23  GLUCOSE 105* 206*  BUN 15 17  CREATININE 0.76 0.78  CALCIUM 8.6* 8.9   Liver Function Tests: Recent Labs  Lab 02/25/21 0404  AST 21  ALT 17  ALKPHOS 97  BILITOT 0.4  PROT 7.7  ALBUMIN 3.7   No results for input(s): LIPASE, AMYLASE in the last 168 hours. No results for input(s): AMMONIA in the last 168 hours. CBC: Recent Labs  Lab 02/24/21 1310 02/25/21 0404  WBC 8.5 7.4  NEUTROABS 4.4  --   HGB 14.2 13.6  HCT 46.0 44.2  MCV 83.5 83.7  PLT 217 208   Cardiac Enzymes: No results for input(s): CKTOTAL, CKMB, CKMBINDEX, TROPONINI in the last 168 hours. BNP: Invalid input(s): POCBNP CBG: No results for input(s): GLUCAP in the last 168 hours. D-Dimer No results for input(s): DDIMER in the last 72 hours. Hgb A1c No results for input(s): HGBA1C in the last 72 hours. Lipid Profile No results for input(s): CHOL, HDL, LDLCALC, TRIG, CHOLHDL, LDLDIRECT in the last 72 hours. Thyroid function studies No results for input(s): TSH, T4TOTAL, T3FREE, THYROIDAB in the last 72 hours.  Invalid input(s): FREET3 Anemia work up No results for input(s): VITAMINB12, FOLATE, FERRITIN, TIBC, IRON, RETICCTPCT in the last 72 hours. Urinalysis    Component Value Date/Time   COLORURINE YELLOW 04/29/2019 0634   APPEARANCEUR CLOUDY (A) 04/29/2019 0634   LABSPEC 1.014 04/29/2019 [place] 7.0 04/29/2019 0634   GLUCOSEU NEGATIVE 04/29/2019 0634   GLUCOSEU NEGATIVE 07/13/2015 1058   HGBUR NEGATIVE 04/29/2019 0634   BILIRUBINUR NEGATIVE 04/29/2019 0634   KETONESUR NEGATIVE 04/29/2019 0634   PROTEINUR NEGATIVE 04/29/2019 0634   UROBILINOGEN 0.2 07/13/2015 1058   NITRITE NEGATIVE 04/29/2019 0634   LEUKOCYTESUR NEGATIVE 04/29/2019 0634    Sepsis Labs Invalid input(s): PROCALCITONIN,  WBC,  LACTICIDVEN Microbiology Recent Results (from the past 240 hour(s))  Resp Panel by RT-PCR (Flu A&B, Covid) Nasopharyngeal Swab     Status: None   Collection Time: 02/24/21  1:10  PM   Specimen: Nasopharyngeal Swab; Nasopharyngeal(NP) swabs in vial transport medium  Result Value Ref Range Status   SARS Coronavirus 2 by RT PCR NEGATIVE NEGATIVE Final    Comment: (NOTE) SARS-CoV-2 target nucleic acids are NOT DETECTED.  The SARS-CoV-2 RNA is generally detectable in upper respiratory specimens during the acute phase of infection. The lowest concentration of SARS-CoV-2 viral copies this assay can detect is 138 copies/mL. A negative result does not preclude SARS-Cov-2 infection and should not be used as the sole basis for treatment or other patient management decisions. A negative result may occur with  improper specimen collection/handling, submission of specimen other than nasopharyngeal swab, presence of viral mutation(s) within the areas targeted by this assay, and inadequate number of viral copies(<138 copies/mL). A negative result must be combined with clinical observations, patient history, and epidemiological information. The expected result is Negative.  Fact Sheet for Patients:  EntrepreneurPulse.com.au  Fact Sheet for Healthcare Providers:  IncredibleEmployment.be  This test is no t yet approved or cleared by the [place] FDA and  has been authorized for detection and/or diagnosis of SARS-CoV-2 by FDA under an Emergency Use Authorization (EUA). This EUA will remain  in effect (meaning this test can be used) for the duration of the COVID-19 declaration under Section 564(b)(1) of the Act, 21 U.S.C.section 360bbb-3(b)(1), unless the authorization is terminated  or revoked sooner.       Influenza A by PCR NEGATIVE NEGATIVE Final   Influenza B by PCR NEGATIVE NEGATIVE Final     Comment: (NOTE) The Xpert Xpress SARS-CoV-2/FLU/RSV plus assay is intended as an aid in the diagnosis of influenza from Nasopharyngeal swab specimens and should not be used as a sole basis for treatment. Nasal washings and aspirates are unacceptable for Xpert Xpress SARS-CoV-2/FLU/RSV testing.  Fact Sheet for Patients: EntrepreneurPulse.com.au  Fact Sheet for Healthcare Providers: IncredibleEmployment.be  This test is not yet approved or cleared by the [place] FDA and has been authorized for detection and/or diagnosis of SARS-CoV-2 by FDA under an Emergency Use Authorization (EUA). This EUA will remain in effect (meaning this test can be used) for the duration of the COVID-19 declaration under Section 564(b)(1) of the Act, 21 U.S.C. section 360bbb-3(b)(1), unless the authorization is terminated or revoked.  Performed at [facility], [place] [address] [place], [place] [address]      Time coordinating discharge: Over 30 minutes  SIGNED:   Lagena Strand J [place] DO  [facility] 02/26/2021, 10:52 AM

## 2021-02-28 ENCOUNTER — Telehealth: Payer: Self-pay

## 2021-02-28 NOTE — Telephone Encounter (Signed)
Transition Care Management Follow-up Telephone Call Date of discharge and from where: 02/26/2021 from [facility] Diagnosis: Sever Persistent Asthma with exacerbation How have you been since you were released from the hospital? "Still have a lot of phelm, but doing fine" Any questions or concerns? No  Items Reviewed: Did the pt receive and understand the discharge instructions provided? Yes  Medications obtained and verified? Yes  Other? No  Any new allergies since your discharge? No  Dietary orders reviewed? Yes; Heart healthy/consistent carb regular diet Do you have support at home? Yes ; daughter  [place] and Equipment/Supplies: Were home health services ordered? no If so, what is the name of the agency? N/a  Has the agency set up a time to come to the patient's home? not applicable Were any new equipment or medical supplies ordered?  No What is the name of the medical supply agency? no Were you able to get the supplies/equipment? not applicable Do you have any questions related to the use of the equipment or supplies? No  Functional Questionnaire: (I = Independent and D = Dependent) ADLs: I    Bathing/Dressing- i  Meal Prep- i  Eating- i  Maintaining continence- i  Transferring/Ambulation- i  Managing Meds- i  Follow up appointments reviewed:  PCP Hospital f/u appt confirmed? Yes  Scheduled to see Pricilla Holm, MD on 03/05/2021 @ 9:40am. [facility] f/u appt confirmed? No  Are transportation arrangements needed? No  If their condition worsens, is the pt aware to call PCP or go to the Emergency Dept.? Yes Was the patient provided with contact information for the PCP's office or ED? Yes Was to pt encouraged to call back with questions or concerns? Yes

## 2021-03-05 ENCOUNTER — Ambulatory Visit (INDEPENDENT_AMBULATORY_CARE_PROVIDER_SITE_OTHER): Payer: 59 | Admitting: Internal Medicine

## 2021-03-05 ENCOUNTER — Other Ambulatory Visit: Payer: Self-pay

## 2021-03-05 ENCOUNTER — Encounter: Payer: Self-pay | Admitting: Internal Medicine

## 2021-03-05 VITALS — BP 130/82 | HR 78 | Resp 18 | Ht 61.0 in | Wt 317.4 lb

## 2021-03-05 DIAGNOSIS — Z87891 Personal history of nicotine dependence: Secondary | ICD-10-CM

## 2021-03-05 DIAGNOSIS — I10 Essential (primary) hypertension: Secondary | ICD-10-CM | POA: Diagnosis not present

## 2021-03-05 DIAGNOSIS — J4520 Mild intermittent asthma, uncomplicated: Secondary | ICD-10-CM

## 2021-03-05 DIAGNOSIS — K219 Gastro-esophageal reflux disease without esophagitis: Secondary | ICD-10-CM | POA: Diagnosis not present

## 2021-03-05 DIAGNOSIS — Z23 Encounter for immunization: Secondary | ICD-10-CM | POA: Diagnosis not present

## 2021-03-05 MED ORDER — AMLODIPINE BESYLATE 10 MG PO TABS: 10.0000 mg | ORAL_TABLET | Freq: Every day | ORAL | 3 refills | Status: DC

## 2021-03-05 MED ORDER — PANTOPRAZOLE SODIUM 40 MG PO TBEC: 40.0000 mg | DELAYED_RELEASE_TABLET | Freq: Every day | ORAL | 3 refills | Status: DC

## 2021-03-05 MED ORDER — PROMETHAZINE-CODEINE 6.25-10 MG/5ML PO SYRP: 5.0000 mL | ORAL_SOLUTION | Freq: Four times a day (QID) | ORAL | 0 refills | Status: DC | PRN

## 2021-03-05 NOTE — Progress Notes (Signed)
   Subjective:   Patient ID: [name], female    DOB: Dec 12, 1955, 65 y.o.   MRN: [id number]  HPI The patient is a 65 YO female coming in for hospital follow up (in for severe asthma flare and required BIPAP support temporarily, treated with steroids and nebulizer treatments). She has been home for <1 week and is just starting to improve. She is not back to normal but maybe 30-40% of the way. Denies new SOB but stable. Some cough still.   PMH, [facility], social history reviewed and udpated  Review of Systems  Constitutional:  Positive for activity change and fatigue.  HENT: Negative.    Eyes: Negative.   Respiratory:  Positive for cough and shortness of breath. Negative for chest tightness.   Cardiovascular:  Negative for chest pain, palpitations and leg swelling.  Gastrointestinal:  Negative for abdominal distention, abdominal pain, constipation, diarrhea, nausea and vomiting.  Musculoskeletal: Negative.   Skin: Negative.   Neurological: Negative.   Psychiatric/Behavioral: Negative.     Objective:  Physical Exam Constitutional:      Appearance: She is well-developed. She is obese.  HENT:     Head: Normocephalic and atraumatic.  Cardiovascular:     Rate and Rhythm: Normal rate and regular rhythm.  Pulmonary:     Effort: Pulmonary effort is normal. No respiratory distress.     Breath sounds: Wheezing present. No rales.     Comments: Minimal wheezing with good air flow bilaterally Abdominal:     General: Bowel sounds are normal. There is no distension.     Palpations: Abdomen is soft.     Tenderness: There is no abdominal tenderness. There is no rebound.  Musculoskeletal:     Cervical back: Normal range of motion.  Skin:    General: Skin is warm and dry.  Neurological:     Mental Status: She is alert and oriented to person, place, and time.     Coordination: Coordination normal.    Vitals:   03/05/21 0935  BP: 130/82  Pulse: 78  Resp: 18  SpO2: 98%  Weight: (!) 317 lb  6.4 oz (144 kg)  Height: 5\' 1"  (1.549 m)    This visit occurred during the SARS-CoV-2 public health emergency.  Safety protocols were in place, including screening questions prior to the visit, additional usage of staff PPE, and extensive cleaning of exam room while observing appropriate contact time as indicated for disinfecting solutions.   Assessment & Plan:  Prevnar 20 and shingrix IM given at visit

## 2021-03-05 NOTE — Patient Instructions (Addendum)
We have sent in the protonix for the acid in the stomach to take 1 pill daily. Give this 1-2 weeks and then let us know how the swallowing is.   We have sent in the cough medicine to use up to 3 times a day for the cough.  We have refilled the amlodipine.  Let us know right away if you are getting worse because we may need to do more prednisone.

## 2021-03-06 DIAGNOSIS — K219 Gastro-esophageal reflux disease without esophagitis: Secondary | ICD-10-CM | POA: Insufficient documentation

## 2021-03-06 NOTE — Assessment & Plan Note (Signed)
Still non-smoking and advised not to resume.

## 2021-03-06 NOTE — Assessment & Plan Note (Signed)
BP was elevated during hospital and she was previously treated for this. She is now taking amlodipine 10 mg daily and this was refilled. We will have her continue this indefinitely.

## 2021-03-06 NOTE — Assessment & Plan Note (Signed)
Having new moderate GERD symptoms since flare. It is unclear if flare and steroids caused GERD or if untreated GERD could have contributed to flare of asthma. Will treat with protonix 40 mg daily for 2-4 weeks and then decide depending on asthma and symptoms if we should continue or stop and monitor.

## 2021-03-06 NOTE — Assessment & Plan Note (Addendum)
With partially resolved flare. She is taking anoro and this is helping some. Using nebulizer BID still and advised to keep scheduled until she is feeling consistently better then she should move to prn again. Given prevnar 20 at visit to help protect against infection. Rx promethazine/dm cough syrup and advised to watch closely. Call for lack of improvement or worsening symptoms.

## 2021-03-25 ENCOUNTER — Other Ambulatory Visit: Payer: Self-pay | Admitting: Internal Medicine

## 2021-03-25 ENCOUNTER — Telehealth: Payer: Self-pay | Admitting: Internal Medicine

## 2021-03-25 NOTE — Telephone Encounter (Signed)
That rx was sent end of November she should not still be taking this.

## 2021-03-25 NOTE — Telephone Encounter (Signed)
Patient states pharmacy is out of promethazine-codeine (PHENERGAN WITH CODEINE) 6.25-10 MG/5ML syrup  Patient requesting another rx for cough

## 2021-03-25 NOTE — Telephone Encounter (Signed)
Called pt. LDVM at [phone] to discuss the cough medicine. Office number was provided.   If pt calls back please find out whether or not she is still taking this medication because it was prescribed at the end of November.

## 2021-03-25 NOTE — Telephone Encounter (Signed)
See below

## 2021-04-12 ENCOUNTER — Ambulatory Visit: Payer: 59 | Admitting: Internal Medicine

## 2021-04-18 ENCOUNTER — Other Ambulatory Visit: Payer: Self-pay

## 2021-04-18 ENCOUNTER — Other Ambulatory Visit: Payer: Self-pay | Admitting: Internal Medicine

## 2021-04-18 ENCOUNTER — Ambulatory Visit: Payer: 59 | Admitting: Internal Medicine

## 2021-04-18 ENCOUNTER — Encounter: Payer: Self-pay | Admitting: Internal Medicine

## 2021-04-18 DIAGNOSIS — J4520 Mild intermittent asthma, uncomplicated: Secondary | ICD-10-CM

## 2021-04-18 MED ORDER — TRELEGY ELLIPTA 100-62.5-25 MCG/ACT IN AEPB: 1.0000 | INHALATION_SPRAY | Freq: Every day | RESPIRATORY_TRACT | 11 refills | Status: DC

## 2021-04-18 MED ORDER — BENZONATATE 200 MG PO CAPS: 200.0000 mg | ORAL_CAPSULE | Freq: Three times a day (TID) | ORAL | 0 refills | Status: DC | PRN

## 2021-04-18 MED ORDER — MONTELUKAST SODIUM 10 MG PO TABS: 10.0000 mg | ORAL_TABLET | Freq: Every day | ORAL | 3 refills | Status: DC

## 2021-04-18 NOTE — Patient Instructions (Addendum)
We have sent in montelukast to take 1 pill daily to help the post nasal drip. Take 1 pill daily.   We have sent in a stronger inhaler called trelegy to replace the anoro.  We will send in tessalon perles to use up to 3 times a day to help reduce coughing.

## 2021-04-18 NOTE — Progress Notes (Signed)
° °  Subjective:   Patient ID: Linda Cabrera, female    DOB: Jun 16, 1955, 66 y.o.   MRN: [id number]  HPI The patient is a 66 YO female coming in for problems with chronic cough. Has been coughing still since our last visit in November and some reported wheezing. She is using nebulizer in the evening every night to help with coughing while trying to sleep.   Review of Systems  Constitutional: Negative.   HENT: Negative.    Eyes: Negative.   Respiratory:  Positive for cough, shortness of breath and wheezing. Negative for chest tightness.   Cardiovascular:  Negative for chest pain, palpitations and leg swelling.  Gastrointestinal:  Negative for abdominal distention, abdominal pain, constipation, diarrhea, nausea and vomiting.  Musculoskeletal: Negative.   Skin: Negative.   Neurological: Negative.   Psychiatric/Behavioral: Negative.     Objective:  Physical Exam Constitutional:      Appearance: She is well-developed. She is obese.  HENT:     Head: Normocephalic and atraumatic.  Cardiovascular:     Rate and Rhythm: Normal rate and regular rhythm.  Pulmonary:     Effort: Pulmonary effort is normal. No respiratory distress.     Breath sounds: Normal breath sounds. No wheezing or rales.     Comments: Better air movement than prior still limited exam Abdominal:     General: Bowel sounds are normal. There is no distension.     Palpations: Abdomen is soft.     Tenderness: There is no abdominal tenderness. There is no rebound.  Musculoskeletal:     Cervical back: Normal range of motion.  Skin:    General: Skin is warm and dry.  Neurological:     Mental Status: She is alert and oriented to person, place, and time.     Coordination: Coordination normal.    Vitals:   04/18/21 0912  BP: 140/86  Pulse: 79  Temp: 98.4 F (36.9 C)  TempSrc: Oral  SpO2: 96%  Weight: (!) 317 lb (143.8 kg)  Height: 5\' 1"  (1.549 m)   This visit occurred during the SARS-CoV-2 public health emergency.   Safety protocols were in place, including screening questions prior to the visit, additional usage of staff PPE, and extensive cleaning of exam room while observing appropriate contact time as indicated for disinfecting solutions.   Assessment & Plan:

## 2021-04-19 NOTE — Assessment & Plan Note (Signed)
We will stop anoro and start trelegy. Suspect there is some restriction. She has started protonix daily since last visit and coughing is not improved. Will add singulair for allergy and sinus drainage as she is still having post nasal drip. Can still use albuterol inhaler or nebulizer as needed for SOB. She is no longer smoking and intends lifelong cessation. Rx tessalon perles for coughing during the day to help lower the amount of coughing to help break the cycle.

## 2021-06-01 ENCOUNTER — Other Ambulatory Visit: Payer: Self-pay | Admitting: Internal Medicine

## 2021-06-19 ENCOUNTER — Other Ambulatory Visit (HOSPITAL_BASED_OUTPATIENT_CLINIC_OR_DEPARTMENT_OTHER): Payer: Self-pay | Admitting: General Practice

## 2021-06-19 DIAGNOSIS — Z1231 Encounter for screening mammogram for malignant neoplasm of breast: Secondary | ICD-10-CM

## 2021-07-05 ENCOUNTER — Other Ambulatory Visit: Payer: Self-pay | Admitting: Internal Medicine

## 2021-07-11 ENCOUNTER — Encounter: Payer: Self-pay | Admitting: Internal Medicine

## 2021-07-11 ENCOUNTER — Other Ambulatory Visit: Payer: Self-pay | Admitting: Internal Medicine

## 2021-07-11 ENCOUNTER — Ambulatory Visit (INDEPENDENT_AMBULATORY_CARE_PROVIDER_SITE_OTHER): Payer: 59 | Admitting: Internal Medicine

## 2021-07-11 VITALS — BP 132/74 | HR 81 | Resp 18 | Ht 61.0 in | Wt 325.2 lb

## 2021-07-11 DIAGNOSIS — I10 Essential (primary) hypertension: Secondary | ICD-10-CM

## 2021-07-11 DIAGNOSIS — Z0000 Encounter for general adult medical examination without abnormal findings: Secondary | ICD-10-CM

## 2021-07-11 DIAGNOSIS — M159 Polyosteoarthritis, unspecified: Secondary | ICD-10-CM

## 2021-07-11 DIAGNOSIS — E559 Vitamin D deficiency, unspecified: Secondary | ICD-10-CM | POA: Diagnosis not present

## 2021-07-11 DIAGNOSIS — M7989 Other specified soft tissue disorders: Secondary | ICD-10-CM

## 2021-07-11 DIAGNOSIS — R7303 Prediabetes: Secondary | ICD-10-CM

## 2021-07-11 LAB — COMPREHENSIVE METABOLIC PANEL
ALT: 18 U/L (ref 0–35)
AST: 17 U/L (ref 0–37)
Albumin: 4.1 g/dL (ref 3.5–5.2)
Alkaline Phosphatase: 113 U/L (ref 39–117)
BUN: 15 mg/dL (ref 6–23)
CO2: 28 mEq/L (ref 19–32)
Calcium: 9.3 mg/dL (ref 8.4–10.5)
Chloride: 105 mEq/L (ref 96–112)
Creatinine, Ser: 0.85 mg/dL (ref 0.40–1.20)
GFR: 72.47 mL/min (ref >60.00)
Glucose, Bld: 92 mg/dL (ref 70–99)
Potassium: 3.9 mEq/L (ref 3.5–5.1)
Sodium: 142 mEq/L (ref 135–145)
Total Bilirubin: 0.3 mg/dL (ref 0.2–1.2)
Total Protein: 7.8 g/dL (ref 6.0–8.3)

## 2021-07-11 LAB — CBC
HCT: 41.2 % (ref 36.0–46.0)
Hemoglobin: 13.2 g/dL (ref 12.0–15.0)
MCHC: 32 g/dL (ref 30.0–36.0)
MCV: 83.5 fl (ref 78.0–100.0)
Platelets: 229 10*3/uL (ref 150.0–400.0)
RBC: 4.93 Mil/uL (ref 3.87–5.11)
RDW: 17.1 % — ABNORMAL HIGH (ref 11.5–15.5)
WBC: 8.5 10*3/uL (ref 4.0–10.5)

## 2021-07-11 LAB — LIPID PANEL
Cholesterol: 171 mg/dL (ref 0–200)
HDL: 74.4 mg/dL (ref >39.00)
LDL Cholesterol: 84 mg/dL (ref 0–99)
NonHDL: 96.26
Total CHOL/HDL Ratio: 2
Triglycerides: 61 mg/dL (ref 0.0–149.0)
VLDL: 12.2 mg/dL (ref 0.0–40.0)

## 2021-07-11 LAB — HEMOGLOBIN A1C: Hgb A1c MFr Bld: 6.3 % (ref 4.6–6.5)

## 2021-07-11 LAB — VITAMIN D 25 HYDROXY (VIT D DEFICIENCY, FRACTURES): VITD: 12.78 ng/mL — ABNORMAL LOW (ref 30.00–100.00)

## 2021-07-11 MED ORDER — CHLORTHALIDONE 25 MG PO TABS: 25.0000 mg | ORAL_TABLET | Freq: Every day | ORAL | 3 refills | Status: AC

## 2021-07-11 MED ORDER — SEMAGLUTIDE-WEIGHT MANAGEMENT 0.5 MG/0.5ML ~~LOC~~ SOAJ: 0.5000 mg | SUBCUTANEOUS | 0 refills | Status: AC

## 2021-07-11 MED ORDER — SEMAGLUTIDE-WEIGHT MANAGEMENT 0.25 MG/0.5ML ~~LOC~~ SOAJ: 0.2500 mg | SUBCUTANEOUS | 0 refills | Status: AC

## 2021-07-11 MED ORDER — SEMAGLUTIDE-WEIGHT MANAGEMENT 1.7 MG/0.75ML ~~LOC~~ SOAJ: 1.7000 mg | SUBCUTANEOUS | 0 refills | Status: AC

## 2021-07-11 MED ORDER — SEMAGLUTIDE-WEIGHT MANAGEMENT 1 MG/0.5ML ~~LOC~~ SOAJ: 1.0000 mg | SUBCUTANEOUS | 0 refills | Status: AC

## 2021-07-11 MED ORDER — SEMAGLUTIDE-WEIGHT MANAGEMENT 2.4 MG/0.75ML ~~LOC~~ SOAJ: 2.4000 mg | SUBCUTANEOUS | 0 refills | Status: AC

## 2021-07-11 NOTE — Progress Notes (Signed)
? ?  Subjective:  ? ?Patient ID: Linda Cabrera, female    DOB: 10-19-1955, 66 y.o.   MRN: [id number] ? ?HPI ?The patient is here for physical. ? ?PMH, [facility], social history reviewed and updated ? ?Review of Systems  ?Constitutional: Negative.   ?HENT: Negative.    ?Eyes: Negative.   ?Respiratory:  Negative for cough, chest tightness and shortness of breath.   ?Cardiovascular:  Negative for chest pain, palpitations and leg swelling.  ?Gastrointestinal:  Negative for abdominal distention, abdominal pain, constipation, diarrhea, nausea and vomiting.  ?Musculoskeletal: Negative.   ?Skin: Negative.   ?Neurological: Negative.   ?Psychiatric/Behavioral: Negative.    ? ?Objective:  ?Physical Exam ?Constitutional:   ?   Appearance: She is well-developed. She is obese.  ?HENT:  ?   Head: Normocephalic and atraumatic.  ?Cardiovascular:  ?   Rate and Rhythm: Normal rate and regular rhythm.  ?Pulmonary:  ?   Effort: Pulmonary effort is normal. No respiratory distress.  ?   Breath sounds: Normal breath sounds. No wheezing or rales.  ?Abdominal:  ?   General: Bowel sounds are normal. There is no distension.  ?   Palpations: Abdomen is soft.  ?   Tenderness: There is no abdominal tenderness. There is no rebound.  ?Musculoskeletal:     ?   General: Tenderness present.  ?   Cervical back: Normal range of motion.  ?Skin: ?   General: Skin is warm and dry.  ?Neurological:  ?   Mental Status: She is alert and oriented to person, place, and time.  ?   Coordination: Coordination normal.  ? ? ?Vitals:  ? 07/11/21 0905  ?BP: 132/74  ?Pulse: 81  ?Resp: 18  ?SpO2: 100%  ?Weight: (!) 325 lb 3.2 oz (147.5 kg)  ?Height: '5\' 1"'$  (1.549 m)  ? ?This visit occurred during the SARS-CoV-2 public health emergency.  Safety protocols were in place, including screening questions prior to the visit, additional usage of staff PPE, and extensive cleaning of exam room while observing appropriate contact time as indicated for disinfecting solutions.  ? ?Assessment &  Plan:  ? ? ?

## 2021-07-11 NOTE — Patient Instructions (Signed)
We will stop amlodipine 10 mg daily and lasix (furosemide). We will replace these will with chlorthalidone which is a stronger fluid pill to take 1 pill daily.  ? ? ?

## 2021-07-12 ENCOUNTER — Other Ambulatory Visit: Payer: Self-pay | Admitting: Internal Medicine

## 2021-07-12 DIAGNOSIS — M7989 Other specified soft tissue disorders: Secondary | ICD-10-CM | POA: Insufficient documentation

## 2021-07-12 MED ORDER — VITAMIN D (ERGOCALCIFEROL) 1.25 MG (50000 UNIT) PO CAPS: 50000.0000 [IU] | ORAL_CAPSULE | ORAL | 0 refills | Status: DC

## 2021-07-12 NOTE — Assessment & Plan Note (Signed)
Flu shot declines. Covid-19 counseled. Shingrix complete. Tetanus up to date. Colonoscopy up to date. Mammogram up to date, pap smear up to date. Counseled about sun safety and mole surveillance. Counseled about the dangers of distracted driving. Given 10 year screening recommendations.  ? ?

## 2021-07-12 NOTE — Assessment & Plan Note (Signed)
Checking Hga1c for follow up.  ?

## 2021-07-12 NOTE — Assessment & Plan Note (Signed)
Will stop amlodipine 10 mg daily and lasix 40 mg daily and start chlorthalidone 25 mg daily for better BP and swelling control. Amlodipine could have been contributing.  ?

## 2021-07-12 NOTE — Assessment & Plan Note (Signed)
Rx wegovy titration to help with weight loss. Checking HgA1c and lipid panel.  ?

## 2021-07-12 NOTE — Assessment & Plan Note (Signed)
Due to swelling in the legs we will stop amlodipine 10 mg daily and lasix 40 mg daily and combine into 1 rx chlorthalidone 25 mg daily. Follow up 2-3 months and call sooner for high BP or lack of improvement in swelling.  ?

## 2021-07-12 NOTE — Assessment & Plan Note (Signed)
Is working on weight loss to help. rx wegovy. ?

## 2021-07-15 ENCOUNTER — Telehealth: Payer: Self-pay | Admitting: Internal Medicine

## 2021-07-15 NOTE — Telephone Encounter (Signed)
Pt states chlorthalidone (HYGROTON) 25 MG tablet is not helping w/ fluid, pt states she still has swelling in her arms, hands, legs, and feet ? ?Pt requesting a cb w/ provider's recommendations ?

## 2021-07-15 NOTE — Telephone Encounter (Signed)
Would recommend to try for several weeks. We may not be able to eliminate fluid altogether. We can do compression stockings to help as well if desired.  ?

## 2021-07-16 NOTE — Telephone Encounter (Signed)
We could do metformin instead for her pre-diabetes. ?

## 2021-07-26 ENCOUNTER — Ambulatory Visit (HOSPITAL_BASED_OUTPATIENT_CLINIC_OR_DEPARTMENT_OTHER)
Admission: RE | Admit: 2021-07-26 | Discharge: 2021-07-26 | Disposition: A | Discharge status: Home / Self Care | Payer: 59 | Source: Ambulatory Visit | Attending: General Practice | Admitting: General Practice

## 2021-07-26 ENCOUNTER — Encounter (HOSPITAL_BASED_OUTPATIENT_CLINIC_OR_DEPARTMENT_OTHER): Payer: Self-pay | Admitting: Radiology

## 2021-07-26 DIAGNOSIS — Z1231 Encounter for screening mammogram for malignant neoplasm of breast: Secondary | ICD-10-CM | POA: Insufficient documentation

## 2021-07-26 NOTE — Telephone Encounter (Signed)
Per msg " Alternative Requested:NOT COVERED PLEASE ADVISE." ?

## 2021-08-07 ENCOUNTER — Ambulatory Visit: Payer: 59 | Admitting: Obstetrics & Gynecology

## 2021-08-19 ENCOUNTER — Other Ambulatory Visit: Payer: Self-pay | Admitting: Internal Medicine

## 2021-08-20 NOTE — Telephone Encounter (Signed)
She should still be taking. This was prescribed on 07/12/21 so she should not have run out until 6/31/23 ?

## 2021-08-20 NOTE — Telephone Encounter (Signed)
Pls advise if you would like pt to continue taking or take ovc vitamin D..Chryl Heck ?

## 2021-08-29 ENCOUNTER — Ambulatory Visit (INDEPENDENT_AMBULATORY_CARE_PROVIDER_SITE_OTHER): Payer: 59 | Admitting: Obstetrics & Gynecology

## 2021-08-29 ENCOUNTER — Encounter: Payer: Self-pay | Admitting: Obstetrics & Gynecology

## 2021-08-29 VITALS — BP 120/78 | HR 70 | Resp 16 | Ht 61.25 in | Wt 315.0 lb

## 2021-08-29 DIAGNOSIS — Z01419 Encounter for gynecological examination (general) (routine) without abnormal findings: Secondary | ICD-10-CM | POA: Diagnosis not present

## 2021-08-29 DIAGNOSIS — Z780 Asymptomatic menopausal state: Secondary | ICD-10-CM | POA: Diagnosis not present

## 2021-08-29 DIAGNOSIS — R87810 Cervical high risk human papillomavirus (HPV) DNA test positive: Secondary | ICD-10-CM | POA: Diagnosis not present

## 2021-08-29 NOTE — Progress Notes (Signed)
Linda Cabrera [birth year] [id number]   History:    66 y.o. G1P1L1  RP:  Established patient presenting for annual gyn exam   HPI: Postmenopause, well on no HRT.  No PMB x HSC/Excision of Polyp/D+C on 01/07/21.  No pelvic pain.  Had a fall from her bed 2 days ago, can walk, no evidence of fracture, but doesn't want to assume the Gynecologic position today.  Pap 07/2020 was Negative, but HR HPV was Positive.  HPV 16-18-45 Neg.  Needs to repeat a Pap test this year.  Breasts normal.  Mammo Neg 07/2021.  Colono 2015.  BMI 59.03.  Health labs with Fam MD.  Past medical history,surgical history, family history and social history were all reviewed and documented in the EPIC chart.  Gynecologic History Patient's last menstrual period was 09/11/2020.  Obstetric History OB History  Gravida Para Term Preterm AB Living  '1 1 1     1  '$ SAB IAB Ectopic Multiple Live Births               # Outcome Date GA Lbr Len/2nd Weight Sex Delivery Anes PTL Lv  1 Term             ROS: A ROS was performed and pertinent positives and negatives are included in the history. GENERAL: No fevers or chills. HEENT: No change in vision, no earache, sore throat or sinus congestion. NECK: No pain or stiffness. CARDIOVASCULAR: No chest pain or pressure. No palpitations. PULMONARY: No shortness of breath, cough or wheeze. GASTROINTESTINAL: No abdominal pain, nausea, vomiting or diarrhea, melena or bright red blood per rectum. GENITOURINARY: No urinary frequency, urgency, hesitancy or dysuria. MUSCULOSKELETAL: No joint or muscle pain, no back pain, no recent trauma. DERMATOLOGIC: No rash, no itching, no lesions. ENDOCRINE: No polyuria, polydipsia, no heat or cold intolerance. No recent change in weight. HEMATOLOGICAL: No anemia or easy bruising or bleeding. NEUROLOGIC: No headache, seizures, numbness, tingling or weakness. PSYCHIATRIC: No depression, no loss of interest in normal activity or change in sleep pattern.      Exam:   BP 120/78   Pulse 70   Resp 16   Ht 5' 1.25" (1.556 m)   Wt (!) 315 lb (142.9 kg)   LMP 09/11/2020 Comment: BTL  BMI 59.03 kg/m   Body mass index is 59.03 kg/m.  General appearance : Well developed well nourished female. No acute distress HEENT: Eyes: no retinal hemorrhage or exudates,  Neck supple, trachea midline, no carotid bruits, no thyroidmegaly Lungs: Clear to auscultation, no rhonchi or wheezes, or rib retractions  Heart: Regular rate and rhythm, no murmurs or gallops Breast:Examined in sitting and supine position were symmetrical in appearance, no palpable masses or tenderness,  no skin retraction, no nipple inversion, no nipple discharge, no skin discoloration, no axillary or supraclavicular lymphadenopathy Abdomen: no palpable masses or tenderness, no rebound or guarding Extremities: no edema or skin discoloration or tenderness  Pelvic: Deferred to next visit re recent fall with pain, declines the Gynecologic exam today.   Assessment/Plan:  66 y.o. female for annual exam   1. Well female exam with routine gynecological exam Postmenopause, well on no HRT.  No PMB x HSC/Excision of Polyp/D+C on 01/07/21.  No pelvic pain.  Had a fall from her bed 2 days ago, can walk, no evidence of fracture, but doesn't want to assume the Gynecologic position today.  Pap 07/2020 was Negative, but HR HPV was Positive.  HPV 16-18-45 Neg.  Needs to repeat  a Pap test this year.  Breasts normal.  Mammo Neg 07/2021.  Colono 2015.  BMI 59.03.  Health labs with Fam MD.  2. Cervical high risk human papillomavirus (HPV) DNA test positive F/U ASAP, when can have a Gynecologic exam, for Pap/HR HPV.  3. Postmenopause Postmenopause, well on no HRT.  No PMB x HSC/Excision of Polyp/D+C on 01/07/21.  No pelvic pain.   4. Obesity, morbid, BMI 50 or higher (HCC) Low calorie/carb diet.  Increase fitness activities.  Other orders - amLODipine (NORVASC) 10 MG tablet; Take 10 mg by mouth daily.    [name] MD, 10:34 AM 08/29/2021

## 2021-09-11 ENCOUNTER — Other Ambulatory Visit: Payer: Self-pay | Admitting: Internal Medicine

## 2021-10-01 ENCOUNTER — Other Ambulatory Visit: Payer: Self-pay | Admitting: Internal Medicine

## 2021-10-09 ENCOUNTER — Ambulatory Visit: Payer: 59 | Admitting: Obstetrics & Gynecology

## 2021-11-22 ENCOUNTER — Other Ambulatory Visit: Payer: Self-pay | Admitting: Internal Medicine

## 2021-12-12 ENCOUNTER — Other Ambulatory Visit (HOSPITAL_COMMUNITY)
Admission: RE | Admit: 2021-12-12 | Discharge: 2021-12-12 | Disposition: A | Discharge status: Home / Self Care | Payer: Commercial Managed Care - HMO | Source: Ambulatory Visit | Attending: Obstetrics & Gynecology | Admitting: Obstetrics & Gynecology

## 2021-12-12 ENCOUNTER — Ambulatory Visit (INDEPENDENT_AMBULATORY_CARE_PROVIDER_SITE_OTHER): Payer: Commercial Managed Care - HMO | Admitting: Obstetrics & Gynecology

## 2021-12-12 ENCOUNTER — Encounter: Payer: Self-pay | Admitting: Obstetrics & Gynecology

## 2021-12-12 VITALS — BP 116/78 | HR 68

## 2021-12-12 DIAGNOSIS — R87810 Cervical high risk human papillomavirus (HPV) DNA test positive: Secondary | ICD-10-CM

## 2021-12-12 DIAGNOSIS — Z124 Encounter for screening for malignant neoplasm of cervix: Secondary | ICD-10-CM | POA: Diagnosis not present

## 2021-12-12 DIAGNOSIS — Z01419 Encounter for gynecological examination (general) (routine) without abnormal findings: Secondary | ICD-10-CM | POA: Diagnosis present

## 2021-12-12 NOTE — Progress Notes (Signed)
    Linda Cabrera February 15, 1956 [id number]        66 y.o.  G1P1001   RP: Pelvic exam with Pap/HPV HR to complete Annual Gyn exam of 08/29/2021  HPI: At Annual Gyn exam 08/29/21 we noted: Postmenopause, well on no HRT.  No PMB x HSC/Excision of Polyp/D+C on 01/07/21.  No pelvic pain.  Had a fall from her bed 2 days ago, can walk, no evidence of fracture, but doesn't want to assume the Gynecologic position today.  Pap 07/2020 was Negative, but HR HPV was Positive.  HPV 16-18-45 Neg.  Needs to repeat a Pap test this year.  Breasts normal.  Mammo Neg 07/2021.  Colono 2015.  BMI 59.03.  Health labs with Fam MD.   OB History  Gravida Para Term Preterm AB Living  '1 1 1     1  '$ SAB IAB Ectopic Multiple Live Births               # Outcome Date GA Lbr Len/2nd Weight Sex Delivery Anes PTL Lv  1 Term             Past medical history,surgical history, problem list, medications, allergies, family history and social history were all reviewed and documented in the EPIC chart.   Directed ROS with pertinent positives and negatives documented in the history of present illness/assessment and plan.  Exam:  Vitals:   12/12/21 0949  BP: 116/78  Pulse: 68  SpO2: 99%   General appearance:  Normal  Abdomen: Normal  Gynecologic exam: Vulva normal.  Speculum:  Cervix normal/Vagina normal.  Pap/HPV HR done.  Bimanual exam:  Uterus AV, normal volume, mobile, NT.  No adnexal mass felt, NT.   Assessment/Plan:  66 y.o. G1P1001   1. Encounter for routine gynecological examination with Papanicolaou smear of cervix Healed from fall in 08/2021, can now complete the Gyn exam/Pap test.  Normal gynecologic exam.  Pap/HPV HR done. - Cytology - PAP( [place])  2. Cervical high risk human papillomavirus (HPV) DNA test positive - Cytology - PAP( [place])   [name] MD, 10:14 AM 12/12/2021

## 2021-12-18 LAB — CYTOLOGY - PAP
Comment: NEGATIVE
Diagnosis: NEGATIVE
High risk HPV: NEGATIVE

## 2022-01-08 ENCOUNTER — Other Ambulatory Visit: Payer: Self-pay | Admitting: Internal Medicine

## 2022-01-29 ENCOUNTER — Encounter: Payer: Self-pay | Admitting: Obstetrics & Gynecology

## 2022-01-29 ENCOUNTER — Ambulatory Visit (INDEPENDENT_AMBULATORY_CARE_PROVIDER_SITE_OTHER): Payer: Commercial Managed Care - HMO | Admitting: Obstetrics & Gynecology

## 2022-01-29 VITALS — BP 108/82 | HR 93 | Resp 97

## 2022-01-29 DIAGNOSIS — N950 Postmenopausal bleeding: Secondary | ICD-10-CM

## 2022-01-29 DIAGNOSIS — N898 Other specified noninflammatory disorders of vagina: Secondary | ICD-10-CM

## 2022-01-29 LAB — WET PREP FOR TRICH, YEAST, CLUE

## 2022-01-29 MED ORDER — TINIDAZOLE 500 MG PO TABS: 1000.0000 mg | ORAL_TABLET | Freq: Two times a day (BID) | ORAL | 0 refills | Status: AC

## 2022-01-29 NOTE — Progress Notes (Signed)
    Linda Cabrera November 12, 1955 [id number]        66 y.o.  G1P1001   RP: PMB with cramps x 1 day, 3 weeks ago  HPI: PMB with cramps x 1 day, 3 weeks ago.  Patient reports feeling bilateral breast tenderness the day before.  The bleeding was clearly from the vagina, dark red.  No longer having any of those symptoms.  Urine and BMs normal.  HSC/Myosure/D+C 01/07/21 Benign endometrial polyp. Pap Neg 12/12/20.     OB History  Gravida Para Term Preterm AB Living  '1 1 1     1  '$ SAB IAB Ectopic Multiple Live Births               # Outcome Date GA Lbr Len/2nd Weight Sex Delivery Anes PTL Lv  1 Term             Past medical history,surgical history, problem list, medications, allergies, family history and social history were all reviewed and documented in the EPIC chart.   Directed ROS with pertinent positives and negatives documented in the history of present illness/assessment and plan.  Exam:  Vitals:   01/29/22 0816  BP: 108/82  Pulse: 93  Resp: (!) 97   General appearance:  Normal   Gynecologic exam: Vulva normal.  Speculum:  Cervix/vagina normal.  No lesion, no polyp.  No blood.  Mild vaginal d/c.  Wet prep done.  Wet prep: Clue cells present.  Odor positive.   Assessment/Plan:  66 y.o. G1P1001   1. Postmenopausal bleeding PMB with cramps x 1 day, 3 weeks ago.  Patient reports feeling bilateral breast tenderness the day before.  The bleeding was clearly from the vagina, dark red.  No longer having any of those symptoms.  Urine and BMs normal.  HSC/Myosure/D+C 01/07/21 Benign endometrial polyp. Pap Neg 12/12/20.  Gyn exam today with BV present, no lesion/polyp seen.  Will treat BV.  F/U with a Pelvic US/Possible EBx to further investigate.  Patient voiced understanding and agreement with the plan. - US Transvaginal Non-OB; Future  2. Vaginal discharge Bacterial Vaginosis confirmed by wet prep.  Will treat with Tinidazole.  Usage reviewed and prescription sent to pharmacy. - WET  PREP FOR [place], YEAST, CLUE   Other orders - tinidazole (TINDAMAX) 500 MG tablet; Take 2 tablets (1,000 mg total) by mouth 2 (two) times daily for 2 days.   [name] MD, 8:51 AM 01/29/2022

## 2022-02-04 ENCOUNTER — Other Ambulatory Visit: Payer: Self-pay | Admitting: Internal Medicine

## 2022-02-04 MED ORDER — FLOVENT DISKUS 100 MCG/ACT IN AEPB: INHALATION_SPRAY | RESPIRATORY_TRACT | 2 refills | Status: DC

## 2022-02-04 NOTE — Telephone Encounter (Signed)
Please advise 

## 2022-02-15 ENCOUNTER — Other Ambulatory Visit: Payer: Self-pay | Admitting: Internal Medicine

## 2022-02-20 ENCOUNTER — Ambulatory Visit (INDEPENDENT_AMBULATORY_CARE_PROVIDER_SITE_OTHER): Payer: Commercial Managed Care - HMO | Admitting: Obstetrics & Gynecology

## 2022-02-20 ENCOUNTER — Encounter: Payer: Self-pay | Admitting: Obstetrics & Gynecology

## 2022-02-20 ENCOUNTER — Ambulatory Visit (INDEPENDENT_AMBULATORY_CARE_PROVIDER_SITE_OTHER): Payer: Commercial Managed Care - HMO

## 2022-02-20 VITALS — BP 116/78 | HR 73

## 2022-02-20 DIAGNOSIS — N950 Postmenopausal bleeding: Secondary | ICD-10-CM | POA: Diagnosis not present

## 2022-02-20 NOTE — Progress Notes (Signed)
    Linda Cabrera 1955-07-12 [id number]        66 y.o.  G1P1001   RP: PMB for Pelvic US  HPI: No PMB since the 1 day episode with cramps in 12/2021.  Patient reports feeling bilateral breast tenderness the day before that.  The bleeding was clearly from the vagina, dark red.  No longer having any of those symptoms.  Urine and BMs normal.  HSC/Myosure/D+C 01/07/21 Benign endometrial polyp. Pap Neg 12/12/20.       OB History  Gravida Para Term Preterm AB Living  '1 1 1     1  '$ SAB IAB Ectopic Multiple Live Births               # Outcome Date GA Lbr Len/2nd Weight Sex Delivery Anes PTL Lv  1 Term             Past medical history,surgical history, problem list, medications, allergies, family history and social history were all reviewed and documented in the EPIC chart.   Directed ROS with pertinent positives and negatives documented in the history of present illness/assessment and plan.  Exam:  Vitals:   02/20/22 0844  BP: 116/78  Pulse: 73  SpO2: 97%   General appearance:  Normal  Pelvic US today: Comparison is made with previous scan in June 2022, prior to the polyp excision with D&C.  T/V images.  Anteverted uterus normal in size and shape with 3 small intramural fibroids.  The uterus is measured at 7.52 x 4.77 x 3.44 cm.  The largest fibroid is 1.34 cm and the other 2 are below 1 cm.  The endometrial lining is thin and symmetrical measured at 2.56 mm with no mass or thickening or abnormal blood flow seen.  Both ovaries are small with atrophic appearance.  No adnexal mass.  No free fluid in the pelvis.   Assessment/Plan:  66 y.o. G1P1001   1. Postmenopausal bleeding  No PMB since the 1 day episode with cramps in 12/2021.  Patient reports feeling bilateral breast tenderness the day before that.  The bleeding was clearly from the vagina, dark red.  No longer having any of those symptoms.  Urine and BMs normal.  HSC/Myosure/D+C 01/07/21 Benign endometrial polyp. Pap Neg 12/12/20.   Pelvic US findings thoroughly reviewed. Patient reassured that the endometrial line is very thin and normal at 2.56 mm.  Uterus with 3 very small fibroids, bilateral ovaries normal and no FF in the pelvis.  Bleeding precautions discussed, if recurs will proceed with an EBx.  Patient voiced understanding and agreement.  [name] MD, 9:22 AM 02/20/2022

## 2022-04-16 DIAGNOSIS — N939 Abnormal uterine and vaginal bleeding, unspecified: Secondary | ICD-10-CM | POA: Diagnosis not present

## 2022-05-02 ENCOUNTER — Ambulatory Visit (INDEPENDENT_AMBULATORY_CARE_PROVIDER_SITE_OTHER): Payer: Medicare PPO | Admitting: Gastroenterology

## 2022-05-02 ENCOUNTER — Encounter: Payer: Self-pay | Admitting: Gastroenterology

## 2022-05-02 VITALS — BP 136/76 | HR 80 | Ht 61.25 in | Wt 319.2 lb

## 2022-05-02 DIAGNOSIS — R1310 Dysphagia, unspecified: Secondary | ICD-10-CM

## 2022-05-02 DIAGNOSIS — K5900 Constipation, unspecified: Secondary | ICD-10-CM

## 2022-05-02 DIAGNOSIS — R1013 Epigastric pain: Secondary | ICD-10-CM | POA: Diagnosis not present

## 2022-05-02 MED ORDER — SUCRALFATE 1 G PO TABS: 1.0000 g | ORAL_TABLET | Freq: Three times a day (TID) | ORAL | 1 refills | Status: DC

## 2022-05-02 NOTE — Patient Instructions (Addendum)
_______________________________________________________  If your blood pressure at your visit was 140/90 or greater, please contact your primary care physician to follow up on this.  _______________________________________________________  If you are age 66 or older, your body mass index should be between 23-30. Your Body mass index is 59.83 kg/m. If this is out of the aforementioned range listed, please consider follow up with your Primary Care Provider.  If you are age 26 or younger, your body mass index should be between 19-25. Your Body mass index is 59.83 kg/m. If this is out of the aformentioned range listed, please consider follow up with your Primary Care Provider.   ________________________________________________________  The [place] providers would like to encourage you to use [facility] to communicate with providers for non-urgent requests or questions.  Due to long hold times on the telephone, sending your provider a message by [facility] may be a faster and more efficient way to get a response.  Please allow 48 business hours for a response.  Please remember that this is for non-urgent requests.  _______________________________________________________   Dennis Bast have been scheduled for an endoscopy. Please follow written instructions given to you at your visit today. If you use inhalers (even only as needed), please bring them with you on the day of your procedure.  We have sent the following medications to your pharmacy for you to pick up at your convenience:  Carafate   Janett Billow recommends that you complete a bowel purge (to clean out your bowels). Please do the following: Purchase a bottle of Miralax over the counter as well as a box of 5 mg dulcolax tablets. Take 4 dulcolax tablets. Wait 1 hour. You will then drink 6-8 capfuls of Miralax mixed in an adequate amount of water/juice/gatorade (you may choose which of these liquids to drink) over the next 2-3 hours. You should expect  results within 1 to 6 hours after completing the bowel purge.  Take Miralax daily

## 2022-05-02 NOTE — Progress Notes (Signed)
05/02/2022 Linda Cabrera [id number] 05/28/1955   HISTORY OF PRESENT ILLNESS: This is a 67 year old female who is a patient Dr. Vena Rua, known to him only for colonoscopy in May 2015.  At that time she was only found to have mild diverticulosis and melanosis coli.  Repeat was recommended at 10-year interval.  She is now here today with complaints of abdominal pain in her upper to mid abdomen.  She says that in the epigastrium is like a burning type pain.  She has been on pantoprazole 40 mg daily for about the past year.  Looks like her PCP had placed a referral here in November 2022 for GERD.  She says that sometimes on the left side of her abdomen she gets more sharp pains.  She says that when she eats sometimes she feels like solid food gets stuck and will not go down right.  She also mentions that she feels like she could use a good cleanout as she has had some issues with constipation, which has been ongoing/chronic.   Past Medical History:  Diagnosis Date   Anemia, unspecified 07/13/2015   Arthritis    Asthma    Herpes    Hypertension    Past Surgical History:  Procedure Laterality Date   BREATH TEK H PYLORI N/A 02/13/2014   Procedure: BREATH TEK H PYLORI;  Surgeon: Gayland Curry, MD;  Location: [facility];  Service: General;  Laterality: N/A;   CHOLECYSTECTOMY N/A 04/29/2019   Procedure: LAPAROSCOPIC CHOLECYSTECTOMY;  Surgeon: Mickeal Skinner, MD;  Location: [facility];  Service: General;  Laterality: N/A;   COLONOSCOPY N/A 08/15/2013   Procedure: COLONOSCOPY;  Surgeon: Jerene Bears, MD;  Location: [facility];  Service: Gastroenterology;  Laterality: N/A;   DILATATION & CURETTAGE/HYSTEROSCOPY WITH MYOSURE N/A 01/07/2021   Procedure: [place];  Surgeon: [name], MD;  Location: [place];  Service: Gynecology;  Laterality: N/A;   TONSILLECTOMY     TUBAL LIGATION      reports that she quit smoking about 17 years ago. Her  smoking use included cigarettes. She has never used smokeless tobacco. She reports current alcohol use. She reports that she does not use drugs. family history includes Arthritis in her father and mother; Cancer in her mother; Hypertension in her father and mother; Pancreatic cancer in her mother. Allergies  Allergen Reactions   Ace Inhibitors Swelling    Mouth swelling   Lisinopril Swelling      Outpatient Encounter Medications as of 05/02/2022  Medication Sig   amLODipine (NORVASC) 10 MG tablet Take 10 mg by mouth daily.   aspirin EC 81 MG tablet Take 1 tablet (81 mg total) by mouth daily.   aspirin-acetaminophen-caffeine (EXCEDRIN MIGRAINE) 250-250-65 MG tablet Take 2 tablets by mouth daily as needed for headache.   chlorthalidone (HYGROTON) 25 MG tablet Take 1 tablet (25 mg total) by mouth daily.   Fluticasone Propionate, Inhal, (FLOVENT DISKUS) 100 MCG/ACT AEPB 1 inhalation twice per day   Fluticasone-Umeclidin-Vilant (TRELEGY ELLIPTA) 100-62.5-25 MCG/ACT AEPB Inhale 1 puff into the lungs daily.   ipratropium-albuterol (DUONEB) 0.5-2.5 (3) MG/3ML SOLN Take 3 mLs by nebulization every 6 (six) hours as needed (shortness of breath, wheezing).   magnesium hydroxide (DULCOLAX MILK OF MAGNESIA) 400 MG/5ML suspension Take 22.5 mLs by mouth daily as needed (constipation). Dulcolax Liquid   pantoprazole (PROTONIX) 40 MG tablet TAKE 1 TABLET BY MOUTH EVERY DAY   valACYclovir (VALTREX) 1000 MG tablet TAKE 1 TABLET BY MOUTH  EVERY DAY   No facility-administered encounter medications on file as of 05/02/2022.     REVIEW OF SYSTEMS  : All other systems reviewed and negative except where noted in the History of Present Illness.   PHYSICAL EXAM: BP 136/76 (BP Location: Left Arm, Patient Position: Sitting)   Pulse 80   Ht 5' 1.25" (1.556 m)   Wt (!) 319 lb 4 oz (144.8 kg)   LMP 09/11/2020 Comment: BTL  SpO2 97%   BMI 59.83 kg/m  General: Well developed female in no acute distress Head:  Normocephalic and atraumatic Eyes:  Sclerae anicteric, conjunctiva pink. Ears: Normal auditory acuity Lungs: Clear throughout to auscultation; no W/R/R. Heart: Regular rate and rhythm; no M/R/G. Abdomen: Soft, non-distended.  BS present.  Mild epigastric TTP. Musculoskeletal: Symmetrical with no gross deformities  Skin: No lesions on visible extremities Extremities: No edema  Neurological: Alert oriented x 4, grossly non-focal Psychological:  Alert and cooperative. Normal mood and affect  ASSESSMENT AND PLAN: *Dysphagia and epigastric abdominal pain despite pantoprazole 40 mg daily.  Will plan for EGD with possible dilation with Dr. Hilarie Fredrickson.  This needs to be done at [facility] due to patient's BMI greater than 50.  In the interim we will start Carafate tablet 4 times daily.  Prescription sent to pharmacy.  The risks, benefits, and alternatives to EGD with possible dilation were discussed with the patient and she consents to proceed.  *Constipation: Will do a MiraLAX bowel purge and then begin taking MiraLAX 1 capful mixed in 8 ounces of liquid daily.   CC:  Hoyt Koch, *

## 2022-05-05 NOTE — Progress Notes (Signed)
Addendum: Reviewed and agree with assessment and management plan. Nusaybah Ivie M, MD  

## 2022-05-09 ENCOUNTER — Ambulatory Visit (HOSPITAL_BASED_OUTPATIENT_CLINIC_OR_DEPARTMENT_OTHER)
Admission: EM | Admit: 2022-05-09 | Discharge: 2022-05-10 | Disposition: A | Discharge status: Home / Self Care | Payer: Medicare PPO | Attending: Emergency Medicine | Admitting: Emergency Medicine

## 2022-05-09 ENCOUNTER — Encounter (HOSPITAL_BASED_OUTPATIENT_CLINIC_OR_DEPARTMENT_OTHER): Payer: Self-pay

## 2022-05-09 ENCOUNTER — Other Ambulatory Visit: Payer: Self-pay

## 2022-05-09 DIAGNOSIS — I10 Essential (primary) hypertension: Secondary | ICD-10-CM | POA: Insufficient documentation

## 2022-05-09 DIAGNOSIS — Z1152 Encounter for screening for COVID-19: Secondary | ICD-10-CM | POA: Diagnosis not present

## 2022-05-09 DIAGNOSIS — Z87891 Personal history of nicotine dependence: Secondary | ICD-10-CM | POA: Insufficient documentation

## 2022-05-09 DIAGNOSIS — Z6841 Body Mass Index (BMI) 40.0 and over, adult: Secondary | ICD-10-CM | POA: Insufficient documentation

## 2022-05-09 DIAGNOSIS — J45909 Unspecified asthma, uncomplicated: Secondary | ICD-10-CM | POA: Diagnosis not present

## 2022-05-09 DIAGNOSIS — K219 Gastro-esophageal reflux disease without esophagitis: Secondary | ICD-10-CM | POA: Insufficient documentation

## 2022-05-09 DIAGNOSIS — K37 Unspecified appendicitis: Secondary | ICD-10-CM | POA: Diagnosis not present

## 2022-05-09 DIAGNOSIS — K3580 Unspecified acute appendicitis: Secondary | ICD-10-CM | POA: Insufficient documentation

## 2022-05-09 DIAGNOSIS — Z20822 Contact with and (suspected) exposure to covid-19: Secondary | ICD-10-CM | POA: Diagnosis not present

## 2022-05-09 HISTORY — DX: Dyspnea, unspecified: R06.00

## 2022-05-09 HISTORY — DX: Umbilical hernia without obstruction or gangrene: K42.9

## 2022-05-09 HISTORY — DX: Prediabetes: R73.03

## 2022-05-09 LAB — CBC
HCT: 46.3 % — ABNORMAL HIGH (ref 36.0–46.0)
Hemoglobin: 14.5 g/dL (ref 12.0–15.0)
MCH: 26.3 pg (ref 26.0–34.0)
MCHC: 31.3 g/dL (ref 30.0–36.0)
MCV: 84 fL (ref 80.0–100.0)
Platelets: 268 10*3/uL (ref 150–400)
RBC: 5.51 MIL/uL — ABNORMAL HIGH (ref 3.87–5.11)
RDW: 17.8 % — ABNORMAL HIGH (ref 11.5–15.5)
WBC: 15.2 10*3/uL — ABNORMAL HIGH (ref 4.0–10.5)
nRBC: 0 % (ref 0.0–0.2)

## 2022-05-09 LAB — COMPREHENSIVE METABOLIC PANEL
ALT: 16 U/L (ref 0–44)
AST: 13 U/L — ABNORMAL LOW (ref 15–41)
Albumin: 4.1 g/dL (ref 3.5–5.0)
Alkaline Phosphatase: 83 U/L (ref 38–126)
Anion gap: 10 (ref 5–15)
BUN: 19 mg/dL (ref 8–23)
CO2: 32 mmol/L (ref 22–32)
Calcium: 9.9 mg/dL (ref 8.9–10.3)
Chloride: 98 mmol/L (ref 98–111)
Creatinine, Ser: 0.96 mg/dL (ref 0.44–1.00)
GFR, Estimated: >60 mL/min (ref >60)
Glucose, Bld: 120 mg/dL — ABNORMAL HIGH (ref 70–99)
Potassium: 3.2 mmol/L — ABNORMAL LOW (ref 3.5–5.1)
Sodium: 140 mmol/L (ref 135–145)
Total Bilirubin: 0.3 mg/dL (ref 0.3–1.2)
Total Protein: 8.4 g/dL — ABNORMAL HIGH (ref 6.5–8.1)

## 2022-05-09 LAB — LIPASE, BLOOD: Lipase: 24 U/L (ref 11–51)

## 2022-05-09 MED ORDER — ONDANSETRON HCL 4 MG/2ML IJ SOLN
4.0000 mg | Freq: Once | INTRAMUSCULAR | Status: AC
  Administered 2022-05-10: 4 mg via INTRAVENOUS
  Filled 2022-05-09: qty 2

## 2022-05-09 MED ORDER — HYDROMORPHONE HCL 1 MG/ML IJ SOLN
1.0000 mg | Freq: Once | INTRAMUSCULAR | Status: AC
  Administered 2022-05-10: 1 mg via INTRAVENOUS
  Filled 2022-05-09: qty 1

## 2022-05-09 NOTE — ED Provider Triage Note (Signed)
Emergency Medicine Provider Triage Evaluation Note  Linda Cabrera , a 67 y.o. female  was evaluated in triage.  Pt complains of worsening left lower quadrant abdominal pain over the past 2 weeks.  She has had associated vomiting.  No diarrhea.  She reports history of cholecystectomy.  Pain is worse with movement and palpation.  Review of Systems  Positive: Abdominal pain Negative: Fever  Physical Exam  BP (!) 147/89   Pulse 80   Temp 98.4 F (36.9 C) (Oral)   Resp (!) 21   Ht '5\' 1"'$  (1.549 m)   Wt (!) 143.8 kg   LMP 09/11/2020 Comment: BTL  SpO2 95%   BMI 59.90 kg/m  Gen:   Awake, appears uncomfortable Resp:  Normal effort  MSK:   Moves extremities without difficulty  Other:  Abdomen left flank and left lower quadrant abdominal tenderness to palpation, patient winces with palpation in these areas  Medical Decision Making  Medically screening exam initiated at 11:45 PM.  Appropriate orders placed.  Linda Cabrera was informed that the remainder of the evaluation will be completed by another provider, this initial triage assessment does not replace that evaluation, and the importance of remaining in the ED until their evaluation is complete.  CT, pain medication, nausea medication and fluids ordered.   Carlisle Cater, PA-C 05/09/22 2346

## 2022-05-09 NOTE — ED Triage Notes (Signed)
Patient arrives to ED POV c/o LLQ Abd Pain x2 weeks. Per pt she has been following up with her GI doctor who prescribed her meds that have not been helping. Pt states that she has nausea and had multiple episodes of emesis. Last BM on Wednesday. No other complaints at this time. Pt A/O x4

## 2022-05-09 NOTE — ED Notes (Signed)
No answer from patient when called for triage.

## 2022-05-10 ENCOUNTER — Emergency Department (EMERGENCY_DEPARTMENT_HOSPITAL): Payer: Medicare PPO | Admitting: Certified Registered"

## 2022-05-10 ENCOUNTER — Encounter (HOSPITAL_COMMUNITY)
Admission: EM | Disposition: A | Discharge status: Home / Self Care | Payer: Self-pay | Source: Home / Self Care | Attending: Emergency Medicine

## 2022-05-10 ENCOUNTER — Emergency Department (HOSPITAL_COMMUNITY): Payer: Medicare PPO | Admitting: Certified Registered"

## 2022-05-10 ENCOUNTER — Encounter (HOSPITAL_COMMUNITY): Payer: Self-pay | Admitting: *Deleted

## 2022-05-10 ENCOUNTER — Other Ambulatory Visit: Payer: Self-pay

## 2022-05-10 ENCOUNTER — Emergency Department (HOSPITAL_BASED_OUTPATIENT_CLINIC_OR_DEPARTMENT_OTHER): Payer: Medicare PPO

## 2022-05-10 DIAGNOSIS — Z1152 Encounter for screening for COVID-19: Secondary | ICD-10-CM | POA: Diagnosis not present

## 2022-05-10 DIAGNOSIS — K219 Gastro-esophageal reflux disease without esophagitis: Secondary | ICD-10-CM | POA: Diagnosis not present

## 2022-05-10 DIAGNOSIS — J45909 Unspecified asthma, uncomplicated: Secondary | ICD-10-CM

## 2022-05-10 DIAGNOSIS — Z20822 Contact with and (suspected) exposure to covid-19: Secondary | ICD-10-CM | POA: Diagnosis not present

## 2022-05-10 DIAGNOSIS — Z87891 Personal history of nicotine dependence: Secondary | ICD-10-CM | POA: Diagnosis not present

## 2022-05-10 DIAGNOSIS — Z6841 Body Mass Index (BMI) 40.0 and over, adult: Secondary | ICD-10-CM | POA: Diagnosis not present

## 2022-05-10 DIAGNOSIS — K3580 Unspecified acute appendicitis: Secondary | ICD-10-CM | POA: Diagnosis not present

## 2022-05-10 DIAGNOSIS — I10 Essential (primary) hypertension: Secondary | ICD-10-CM

## 2022-05-10 DIAGNOSIS — K37 Unspecified appendicitis: Secondary | ICD-10-CM | POA: Diagnosis not present

## 2022-05-10 DIAGNOSIS — K5730 Diverticulosis of large intestine without perforation or abscess without bleeding: Secondary | ICD-10-CM | POA: Diagnosis not present

## 2022-05-10 DIAGNOSIS — K3531 Acute appendicitis with localized peritonitis and gangrene, without perforation: Secondary | ICD-10-CM | POA: Diagnosis not present

## 2022-05-10 HISTORY — PX: LAPAROSCOPIC APPENDECTOMY: SHX408

## 2022-05-10 LAB — RESP PANEL BY RT-PCR (RSV, FLU A&B, COVID)  RVPGX2
Influenza A by PCR: NEGATIVE
Influenza B by PCR: NEGATIVE
Resp Syncytial Virus by PCR: NEGATIVE
SARS Coronavirus 2 by RT PCR: NEGATIVE

## 2022-05-10 LAB — URINALYSIS, ROUTINE W REFLEX MICROSCOPIC
Bilirubin Urine: NEGATIVE
Glucose, UA: NEGATIVE mg/dL
Hgb urine dipstick: NEGATIVE
Ketones, ur: NEGATIVE mg/dL
Leukocytes,Ua: NEGATIVE
Nitrite: NEGATIVE
Protein, ur: NEGATIVE mg/dL
Specific Gravity, Urine: 1.023 (ref 1.005–1.030)
pH: 6 (ref 5.0–8.0)

## 2022-05-10 SURGERY — APPENDECTOMY, LAPAROSCOPIC
Anesthesia: General

## 2022-05-10 MED ORDER — BUPIVACAINE HCL 0.25 % IJ SOLN
INTRAMUSCULAR | Status: DC | PRN
  Administered 2022-05-10: 19 mL

## 2022-05-10 MED ORDER — METRONIDAZOLE 500 MG/100ML IV SOLN
500.0000 mg | Freq: Once | INTRAVENOUS | Status: AC
  Administered 2022-05-10: 500 mg via INTRAVENOUS
  Filled 2022-05-10: qty 100

## 2022-05-10 MED ORDER — ORAL CARE MOUTH RINSE: 15.0000 mL | Freq: Once | OROMUCOSAL | Status: AC

## 2022-05-10 MED ORDER — SODIUM CHLORIDE 0.9 % IV SOLN
2.0000 g | Freq: Once | INTRAVENOUS | Status: AC
  Administered 2022-05-10: 2 g via INTRAVENOUS
  Filled 2022-05-10: qty 20

## 2022-05-10 MED ORDER — ONDANSETRON HCL 4 MG/2ML IJ SOLN
4.0000 mg | Freq: Once | INTRAMUSCULAR | Status: AC
  Administered 2022-05-10: 4 mg via INTRAVENOUS
  Filled 2022-05-10: qty 2

## 2022-05-10 MED ORDER — IOHEXOL 300 MG/ML  SOLN
100.0000 mL | Freq: Once | INTRAMUSCULAR | Status: AC | PRN
  Administered 2022-05-10: 100 mL via INTRAVENOUS

## 2022-05-10 MED ORDER — CHLORHEXIDINE GLUCONATE 0.12 % MT SOLN
OROMUCOSAL | Status: AC
  Administered 2022-05-10: 15 mL via OROMUCOSAL
  Filled 2022-05-10: qty 15

## 2022-05-10 MED ORDER — ACETAMINOPHEN 325 MG PO TABS
650.0000 mg | ORAL_TABLET | Freq: Once | ORAL | Status: AC
  Administered 2022-05-10: 650 mg via ORAL
  Filled 2022-05-10: qty 2

## 2022-05-10 MED ORDER — PHENYLEPHRINE 80 MCG/ML (10ML) SYRINGE FOR IV PUSH (FOR BLOOD PRESSURE SUPPORT)
PREFILLED_SYRINGE | INTRAVENOUS | Status: AC
  Filled 2022-05-10: qty 10

## 2022-05-10 MED ORDER — SUCCINYLCHOLINE CHLORIDE 200 MG/10ML IV SOSY
PREFILLED_SYRINGE | INTRAVENOUS | Status: DC | PRN
  Administered 2022-05-10: 140 mg via INTRAVENOUS

## 2022-05-10 MED ORDER — ALBUTEROL SULFATE (2.5 MG/3ML) 0.083% IN NEBU
INHALATION_SOLUTION | RESPIRATORY_TRACT | Status: AC
  Filled 2022-05-10: qty 3

## 2022-05-10 MED ORDER — 0.9 % SODIUM CHLORIDE (POUR BTL) OPTIME
TOPICAL | Status: DC | PRN
  Administered 2022-05-10: 1000 mL

## 2022-05-10 MED ORDER — LIDOCAINE 2% (20 MG/ML) 5 ML SYRINGE
INTRAMUSCULAR | Status: DC | PRN
  Administered 2022-05-10: 60 mg via INTRAVENOUS

## 2022-05-10 MED ORDER — MORPHINE SULFATE (PF) 4 MG/ML IV SOLN
4.0000 mg | Freq: Once | INTRAVENOUS | Status: AC
  Administered 2022-05-10: 4 mg via INTRAVENOUS
  Filled 2022-05-10: qty 1

## 2022-05-10 MED ORDER — FENTANYL CITRATE (PF) 250 MCG/5ML IJ SOLN
INTRAMUSCULAR | Status: AC
  Filled 2022-05-10: qty 5

## 2022-05-10 MED ORDER — PIPERACILLIN-TAZOBACTAM 3.375 G IVPB
3.3750 g | Freq: Once | INTRAVENOUS | Status: AC
  Administered 2022-05-10: 3.375 g via INTRAVENOUS
  Filled 2022-05-10: qty 50

## 2022-05-10 MED ORDER — IPRATROPIUM-ALBUTEROL 0.5-2.5 (3) MG/3ML IN SOLN: 3.0000 mL | Freq: Once | RESPIRATORY_TRACT | Status: DC

## 2022-05-10 MED ORDER — SODIUM CHLORIDE 0.9 % IV SOLN
INTRAVENOUS | Status: AC
  Filled 2022-05-10: qty 20

## 2022-05-10 MED ORDER — PROPOFOL 1000 MG/100ML IV EMUL
INTRAVENOUS | Status: AC
  Filled 2022-05-10: qty 100

## 2022-05-10 MED ORDER — PHENYLEPHRINE 80 MCG/ML (10ML) SYRINGE FOR IV PUSH (FOR BLOOD PRESSURE SUPPORT)
PREFILLED_SYRINGE | INTRAVENOUS | Status: DC | PRN
  Administered 2022-05-10 (×3): 80 ug via INTRAVENOUS

## 2022-05-10 MED ORDER — SODIUM CHLORIDE 0.9 % IV SOLN: INTRAVENOUS | Status: DC

## 2022-05-10 MED ORDER — SUCCINYLCHOLINE CHLORIDE 200 MG/10ML IV SOSY
PREFILLED_SYRINGE | INTRAVENOUS | Status: AC
  Filled 2022-05-10: qty 10

## 2022-05-10 MED ORDER — METRONIDAZOLE 500 MG/100ML IV SOLN
INTRAVENOUS | Status: AC
  Filled 2022-05-10: qty 100

## 2022-05-10 MED ORDER — LACTATED RINGERS IV SOLN: INTRAVENOUS | Status: DC

## 2022-05-10 MED ORDER — ONDANSETRON HCL 4 MG/2ML IJ SOLN: 4.0000 mg | Freq: Once | INTRAMUSCULAR | Status: DC | PRN

## 2022-05-10 MED ORDER — BUPIVACAINE HCL (PF) 0.25 % IJ SOLN
INTRAMUSCULAR | Status: AC
  Filled 2022-05-10: qty 30

## 2022-05-10 MED ORDER — FENTANYL CITRATE (PF) 250 MCG/5ML IJ SOLN
INTRAMUSCULAR | Status: DC | PRN
  Administered 2022-05-10: 150 ug via INTRAVENOUS

## 2022-05-10 MED ORDER — CHLORHEXIDINE GLUCONATE 0.12 % MT SOLN: 15.0000 mL | Freq: Once | OROMUCOSAL | Status: AC

## 2022-05-10 MED ORDER — IPRATROPIUM-ALBUTEROL 0.5-2.5 (3) MG/3ML IN SOLN
3.0000 mL | Freq: Once | RESPIRATORY_TRACT | Status: AC
  Administered 2022-05-10: 3 mL via RESPIRATORY_TRACT
  Filled 2022-05-10: qty 3

## 2022-05-10 MED ORDER — ROCURONIUM BROMIDE 10 MG/ML (PF) SYRINGE
PREFILLED_SYRINGE | INTRAVENOUS | Status: DC | PRN
  Administered 2022-05-10: 50 mg via INTRAVENOUS

## 2022-05-10 MED ORDER — OXYCODONE HCL 5 MG PO TABS: 5.0000 mg | ORAL_TABLET | Freq: Four times a day (QID) | ORAL | 0 refills | Status: DC | PRN

## 2022-05-10 MED ORDER — EPHEDRINE SULFATE-NACL 50-0.9 MG/10ML-% IV SOSY
PREFILLED_SYRINGE | INTRAVENOUS | Status: DC | PRN
  Administered 2022-05-10: 5 mg via INTRAVENOUS

## 2022-05-10 MED ORDER — SODIUM CHLORIDE 0.9 % IR SOLN
Status: DC | PRN
  Administered 2022-05-10: 1000 mL

## 2022-05-10 MED ORDER — ACETAMINOPHEN 10 MG/ML IV SOLN
1000.0000 mg | Freq: Once | INTRAVENOUS | Status: DC | PRN
  Administered 2022-05-10: 1000 mg via INTRAVENOUS

## 2022-05-10 MED ORDER — ROCURONIUM BROMIDE 10 MG/ML (PF) SYRINGE
PREFILLED_SYRINGE | INTRAVENOUS | Status: AC
  Filled 2022-05-10: qty 10

## 2022-05-10 MED ORDER — SUGAMMADEX SODIUM 500 MG/5ML IV SOLN
INTRAVENOUS | Status: AC
  Filled 2022-05-10: qty 5

## 2022-05-10 MED ORDER — AMISULPRIDE (ANTIEMETIC) 5 MG/2ML IV SOLN: 10.0000 mg | Freq: Once | INTRAVENOUS | Status: DC | PRN

## 2022-05-10 MED ORDER — ACETAMINOPHEN 10 MG/ML IV SOLN
INTRAVENOUS | Status: AC
  Filled 2022-05-10: qty 100

## 2022-05-10 MED ORDER — PROPOFOL 10 MG/ML IV BOLUS
INTRAVENOUS | Status: DC | PRN
  Administered 2022-05-10: 100 mg via INTRAVENOUS
  Administered 2022-05-10: 20 mg via INTRAVENOUS

## 2022-05-10 MED ORDER — DEXAMETHASONE SODIUM PHOSPHATE 10 MG/ML IJ SOLN
INTRAMUSCULAR | Status: DC | PRN
  Administered 2022-05-10: 10 mg via INTRAVENOUS

## 2022-05-10 MED ORDER — ONDANSETRON HCL 4 MG/2ML IJ SOLN
INTRAMUSCULAR | Status: DC | PRN
  Administered 2022-05-10: 4 mg via INTRAVENOUS

## 2022-05-10 MED ORDER — ONDANSETRON HCL 4 MG/2ML IJ SOLN
INTRAMUSCULAR | Status: AC
  Filled 2022-05-10: qty 2

## 2022-05-10 MED ORDER — FENTANYL CITRATE (PF) 100 MCG/2ML IJ SOLN: 25.0000 ug | INTRAMUSCULAR | Status: DC | PRN

## 2022-05-10 MED ORDER — CEFAZOLIN SODIUM 1 G IJ SOLR
INTRAMUSCULAR | Status: AC
  Filled 2022-05-10: qty 20

## 2022-05-10 MED ORDER — DEXAMETHASONE SODIUM PHOSPHATE 10 MG/ML IJ SOLN
INTRAMUSCULAR | Status: AC
  Filled 2022-05-10: qty 1

## 2022-05-10 MED ORDER — ALBUTEROL SULFATE (2.5 MG/3ML) 0.083% IN NEBU
INHALATION_SOLUTION | RESPIRATORY_TRACT | Status: AC
  Administered 2022-05-10: 2.5 mg
  Filled 2022-05-10: qty 3

## 2022-05-10 MED ORDER — KETOROLAC TROMETHAMINE 15 MG/ML IJ SOLN
INTRAMUSCULAR | Status: AC
  Filled 2022-05-10: qty 1

## 2022-05-10 MED ORDER — KETOROLAC TROMETHAMINE 15 MG/ML IJ SOLN
15.0000 mg | Freq: Once | INTRAMUSCULAR | Status: AC | PRN
  Administered 2022-05-10: 15 mg via INTRAVENOUS

## 2022-05-10 SURGICAL SUPPLY — 48 items
APPLIER CLIP ROT 10 11.4 M/L (STAPLE)
BAG COUNTER SPONGE SURGICOUNT (BAG) ×1 IMPLANT
BLADE CLIPPER SURG (BLADE) IMPLANT
CANISTER SUCT 3000ML PPV (MISCELLANEOUS) ×1 IMPLANT
CHLORAPREP W/TINT 26 (MISCELLANEOUS) ×1 IMPLANT
CLIP APPLIE ROT 10 11.4 M/L (STAPLE) IMPLANT
COVER SURGICAL LIGHT HANDLE (MISCELLANEOUS) ×1 IMPLANT
CUTTER FLEX LINEAR 45M (STAPLE) ×1 IMPLANT
DERMABOND ADVANCED .7 DNX12 (GAUZE/BANDAGES/DRESSINGS) ×1 IMPLANT
ELECT REM PT RETURN 9FT ADLT (ELECTROSURGICAL) ×1
ELECTRODE REM PT RTRN 9FT ADLT (ELECTROSURGICAL) ×1 IMPLANT
GLOVE BIO SURGEON STRL SZ8 (GLOVE) ×1 IMPLANT
GLOVE BIOGEL PI IND STRL 8 (GLOVE) ×1 IMPLANT
GOWN STRL REUS W/ TWL LRG LVL3 (GOWN DISPOSABLE) ×2 IMPLANT
GOWN STRL REUS W/ TWL XL LVL3 (GOWN DISPOSABLE) ×1 IMPLANT
GOWN STRL REUS W/TWL LRG LVL3 (GOWN DISPOSABLE) ×2
GOWN STRL REUS W/TWL XL LVL3 (GOWN DISPOSABLE) ×1
GRASPER SUT TROCAR 14GX15 (MISCELLANEOUS) IMPLANT
KIT BASIN OR (CUSTOM PROCEDURE TRAY) ×1 IMPLANT
KIT TURNOVER KIT B (KITS) ×1 IMPLANT
NDL 22X1.5 STRL (OR ONLY) (MISCELLANEOUS) ×1 IMPLANT
NEEDLE 22X1.5 STRL (OR ONLY) (MISCELLANEOUS) ×1 IMPLANT
NS IRRIG 1000ML POUR BTL (IV SOLUTION) ×1 IMPLANT
PAD ARMBOARD 7.5X6 YLW CONV (MISCELLANEOUS) ×2 IMPLANT
POUCH RETRIEVAL ECOSAC 10 (ENDOMECHANICALS) ×1 IMPLANT
POUCH RETRIEVAL ECOSAC 10MM (ENDOMECHANICALS) ×1
RELOAD 45 VASCULAR/THIN (ENDOMECHANICALS) ×1 IMPLANT
RELOAD STAPLE 45 2.5 WHT GRN (ENDOMECHANICALS) IMPLANT
RELOAD STAPLE 45 3.5 BLU ETS (ENDOMECHANICALS) IMPLANT
RELOAD STAPLE TA45 3.5 REG BLU (ENDOMECHANICALS) IMPLANT
SCISSORS LAP 5X35 DISP (ENDOMECHANICALS) IMPLANT
SET IRRIG TUBING LAPAROSCOPIC (IRRIGATION / IRRIGATOR) ×1 IMPLANT
SET TUBE SMOKE EVAC HIGH FLOW (TUBING) ×1 IMPLANT
SHEARS HARMONIC ACE PLUS 36CM (ENDOMECHANICALS) ×1 IMPLANT
SPECIMEN JAR SMALL (MISCELLANEOUS) ×1 IMPLANT
SUT VIC AB 4-0 PS2 27 (SUTURE) ×1 IMPLANT
TOWEL GREEN STERILE (TOWEL DISPOSABLE) ×1 IMPLANT
TOWEL GREEN STERILE FF (TOWEL DISPOSABLE) ×1 IMPLANT
TRAY FOLEY W/BAG SLVR 16FR (SET/KITS/TRAYS/PACK) ×1
TRAY FOLEY W/BAG SLVR 16FR ST (SET/KITS/TRAYS/PACK) ×1 IMPLANT
TRAY LAPAROSCOPIC MC (CUSTOM PROCEDURE TRAY) ×1 IMPLANT
TROCAR 5M 150ML BLDLS (TROCAR) IMPLANT
TROCAR BALLN 12MMX100 BLUNT (TROCAR) ×1 IMPLANT
TROCAR Z THREAD OPTICAL 12X100 (TROCAR) ×1 IMPLANT
TROCAR Z THREAD OPTICAL 12X150 (TROCAR) IMPLANT
TROCAR Z-THREAD OPTICAL 5X100M (TROCAR) ×1 IMPLANT
WARMER LAPAROSCOPE (MISCELLANEOUS) ×1 IMPLANT
WATER STERILE IRR 1000ML POUR (IV SOLUTION) ×1 IMPLANT

## 2022-05-10 NOTE — ED Notes (Signed)
Handoff report given to [facility] 

## 2022-05-10 NOTE — ED Notes (Signed)
RT at bedside.

## 2022-05-10 NOTE — Anesthesia Postprocedure Evaluation (Signed)
Anesthesia Post Note  Patient: Linda Cabrera  Procedure(s) Performed: APPENDECTOMY LAPAROSCOPIC     Patient location during evaluation: PACU Anesthesia Type: General Level of consciousness: awake Pain management: pain level controlled Vital Signs Assessment: post-procedure vital signs reviewed and stable Respiratory status: spontaneous breathing, nonlabored ventilation and respiratory function stable Cardiovascular status: blood pressure returned to baseline and stable Postop Assessment: no apparent nausea or vomiting Anesthetic complications: no Comments: Nebulizers and incentive spirometry given in the PACU.    No notable events documented.  Last Vitals:  Vitals:   05/10/22 1500 05/10/22 1530  BP: 136/76 119/82  Pulse: 89 79  Resp: 15 18  Temp:    SpO2: 91% 95%    Last Pain:  Vitals:   05/10/22 1530  TempSrc:   PainSc: 0-No pain                 Jennell Janosik P Deonta Bomberger

## 2022-05-10 NOTE — ED Notes (Signed)
RT assessed pt respiratory status. Pt respiratory status stable on RA, clear/dim BLBS, Pt has wheezing in her vocal cord area at this time unrelieved by DUO neb treatment. RT educated pt on ways to control her asthma symptoms and suggested pt return to pulmonology for a PFT. Pt verbalizes understanding at this time.

## 2022-05-10 NOTE — ED Notes (Signed)
Handoff report given to Manuela Schwartz RN at [facility] - op at [facility]

## 2022-05-10 NOTE — ED Notes (Signed)
[facility] at bedside 

## 2022-05-10 NOTE — H&P (Signed)
Linda Cabrera is an 67 y.o. female.   Chief Complaint: lower abdominal pain HPI: 67 year old female with history of hypertension and prediabetes [facility] abdominal pain.  It was more on the left side.  Further workup included CT scan of the abdomen pelvis which shows acute appendicitis.  She was accepted in transfer to preop holding for surgical management.  Past Medical History:  Diagnosis Date   Anemia, unspecified 07/13/2015   Arthritis    Asthma    Dyspnea    Hernia, umbilical    Herpes    Hypertension    Pre-diabetes     Past Surgical History:  Procedure Laterality Date   BREATH TEK H PYLORI N/A 02/13/2014   Procedure: BREATH TEK H PYLORI;  Surgeon: Gayland Curry, MD;  Location: [facility];  Service: General;  Laterality: N/A;   CHOLECYSTECTOMY N/A 04/29/2019   Procedure: LAPAROSCOPIC CHOLECYSTECTOMY;  Surgeon: Mickeal Skinner, MD;  Location: [facility];  Service: General;  Laterality: N/A;   COLONOSCOPY N/A 08/15/2013   Procedure: COLONOSCOPY;  Surgeon: Jerene Bears, MD;  Location: [facility];  Service: Gastroenterology;  Laterality: N/A;   DILATATION & CURETTAGE/HYSTEROSCOPY WITH MYOSURE N/A 01/07/2021   Procedure: [place];  Surgeon: [name], MD;  Location: [place];  Service: Gynecology;  Laterality: N/A;   TONSILLECTOMY     TUBAL LIGATION      Family History  Problem Relation Age of Onset   Arthritis Mother    Hypertension Mother    Cancer Mother        pancreatic   Pancreatic cancer Mother    Arthritis Father    Hypertension Father    Colon cancer Neg Hx    Social History:  reports that she quit smoking about 17 years ago. Her smoking use included cigarettes. She has never used smokeless tobacco. She reports current alcohol use of about 4.0 standard drinks of alcohol per week. She reports that she does not currently use drugs.  Allergies:  Allergies  Allergen Reactions    Ace Inhibitors Swelling    Mouth swelling   Lisinopril Swelling    Medications Prior to Admission  Medication Sig Dispense Refill   amLODipine (NORVASC) 10 MG tablet Take 10 mg by mouth daily.     aspirin EC 81 MG tablet Take 1 tablet (81 mg total) by mouth daily. 90 tablet 11   aspirin-acetaminophen-caffeine (EXCEDRIN MIGRAINE) 250-250-65 MG tablet Take 2 tablets by mouth daily as needed for headache.     chlorthalidone (HYGROTON) 25 MG tablet Take 1 tablet (25 mg total) by mouth daily. 90 tablet 3   Fluticasone-Umeclidin-Vilant (TRELEGY ELLIPTA) 100-62.5-25 MCG/ACT AEPB Inhale 1 puff into the lungs daily. 1 each 11   ipratropium-albuterol (DUONEB) 0.5-2.5 (3) MG/3ML SOLN Take 3 mLs by nebulization every 6 (six) hours as needed (shortness of breath, wheezing). 360 mL 0   magnesium hydroxide (DULCOLAX MILK OF MAGNESIA) 400 MG/5ML suspension Take 22.5 mLs by mouth daily as needed (constipation). Dulcolax Liquid     pantoprazole (PROTONIX) 40 MG tablet TAKE 1 TABLET BY MOUTH EVERY DAY 90 tablet 2   sucralfate (CARAFATE) 1 g tablet Take 1 tablet (1 g total) by mouth 4 (four) times daily -  with meals and at bedtime. 120 tablet 1   valACYclovir (VALTREX) 1000 MG tablet TAKE 1 TABLET BY MOUTH EVERY DAY 30 tablet 2   Fluticasone Propionate, Inhal, (FLOVENT DISKUS) 100 MCG/ACT AEPB 1 inhalation twice per day  1 each 2    Results for orders placed or performed during the hospital encounter of 05/09/22 (from the past 48 hour(s))  Lipase, blood     Status: None   Collection Time: 05/09/22 10:20 PM  Result Value Ref Range   Lipase 24 11 - 51 U/L    Comment: Performed at [facility], [place], [place] [address]  Comprehensive metabolic panel     Status: Abnormal   Collection Time: 05/09/22 10:20 PM  Result Value Ref Range   Sodium 140 135 - 145 mmol/L   Potassium 3.2 (L) 3.5 - 5.1 mmol/L   Chloride 98 98 - 111 mmol/L   CO2 32 22 - 32 mmol/L   Glucose, Bld 120  (H) 70 - 99 mg/dL    Comment: Glucose reference range applies only to samples taken after fasting for at least 8 hours.   BUN 19 8 - 23 mg/dL   Creatinine, Ser 0.96 0.44 - 1.00 mg/dL   Calcium 9.9 8.9 - 10.3 mg/dL   Total Protein 8.4 (H) 6.5 - 8.1 g/dL   Albumin 4.1 3.5 - 5.0 g/dL   AST 13 (L) 15 - 41 U/L   ALT 16 0 - 44 U/L   Alkaline Phosphatase 83 38 - 126 U/L   Total Bilirubin 0.3 0.3 - 1.2 mg/dL   GFR, Estimated >60 >60 mL/min    Comment: (NOTE) Calculated using the CKD-EPI Creatinine Equation (2021)    Anion gap 10 5 - 15    Comment: Performed at [facility], [address], [place], [place] [address]  CBC     Status: Abnormal   Collection Time: 05/09/22 10:20 PM  Result Value Ref Range   WBC 15.2 (H) 4.0 - 10.5 K/uL   RBC 5.51 (H) 3.87 - 5.11 MIL/uL   Hemoglobin 14.5 12.0 - 15.0 g/dL   HCT 46.3 (H) 36.0 - 46.0 %   MCV 84.0 80.0 - 100.0 fL   MCH 26.3 26.0 - 34.0 pg   MCHC 31.3 30.0 - 36.0 g/dL   RDW 17.8 (H) 11.5 - 15.5 %   Platelets 268 150 - 400 K/uL   nRBC 0.0 0.0 - 0.2 %    Comment: Performed at [facility], [address] [place], [place] [address]  Resp panel by RT-PCR (RSV, Flu A&B, Covid) Anterior Nasal Swab     Status: None   Collection Time: 05/10/22 12:56 AM   Specimen: Anterior Nasal Swab  Result Value Ref Range   SARS Coronavirus 2 by RT PCR NEGATIVE NEGATIVE    Comment: (NOTE) SARS-CoV-2 target nucleic acids are NOT DETECTED.  The SARS-CoV-2 RNA is generally detectable in upper respiratory specimens during the acute phase of infection. The lowest concentration of SARS-CoV-2 viral copies this assay can detect is 138 copies/mL. A negative result does not preclude SARS-Cov-2 infection and should not be used as the sole basis for treatment or other patient management decisions. A negative result may occur with  improper specimen collection/handling, submission of specimen other than nasopharyngeal swab, presence  of viral mutation(s) within the areas targeted by this assay, and inadequate number of viral copies(<138 copies/mL). A negative result must be combined with clinical observations, patient history, and epidemiological information. The expected result is Negative.  Fact Sheet for Patients:  EntrepreneurPulse.com.au  Fact Sheet for Healthcare Providers:  IncredibleEmployment.be  This test is no t yet approved or cleared by the [place] FDA and  has been authorized for detection  and/or diagnosis of SARS-CoV-2 by FDA under an Emergency Use Authorization (EUA). This EUA will remain  in effect (meaning this test can be used) for the duration of the COVID-19 declaration under Section 564(b)(1) of the Act, 21 U.S.C.section 360bbb-3(b)(1), unless the authorization is terminated  or revoked sooner.       Influenza A by PCR NEGATIVE NEGATIVE   Influenza B by PCR NEGATIVE NEGATIVE    Comment: (NOTE) The Xpert Xpress SARS-CoV-2/FLU/RSV plus assay is intended as an aid in the diagnosis of influenza from Nasopharyngeal swab specimens and should not be used as a sole basis for treatment. Nasal washings and aspirates are unacceptable for Xpert Xpress SARS-CoV-2/FLU/RSV testing.  Fact Sheet for Patients: EntrepreneurPulse.com.au  Fact Sheet for Healthcare Providers: IncredibleEmployment.be  This test is not yet approved or cleared by the [place] FDA and has been authorized for detection and/or diagnosis of SARS-CoV-2 by FDA under an Emergency Use Authorization (EUA). This EUA will remain in effect (meaning this test can be used) for the duration of the COVID-19 declaration under Section 564(b)(1) of the Act, 21 U.S.C. section 360bbb-3(b)(1), unless the authorization is terminated or revoked.     Resp Syncytial Virus by PCR NEGATIVE NEGATIVE    Comment: (NOTE) Fact Sheet for  Patients: EntrepreneurPulse.com.au  Fact Sheet for Healthcare Providers: IncredibleEmployment.be  This test is not yet approved or cleared by the [place] FDA and has been authorized for detection and/or diagnosis of SARS-CoV-2 by FDA under an Emergency Use Authorization (EUA). This EUA will remain in effect (meaning this test can be used) for the duration of the COVID-19 declaration under Section 564(b)(1) of the Act, 21 U.S.C. section 360bbb-3(b)(1), unless the authorization is terminated or revoked.  Performed at [facility], [address], [place], [place] [address]   Urinalysis, Routine w reflex microscopic -Urine, Clean Catch     Status: None   Collection Time: 05/10/22  2:55 AM  Result Value Ref Range   Color, Urine YELLOW YELLOW   APPearance CLEAR CLEAR   Specific Gravity, Urine 1.023 1.005 - 1.030   pH 6.0 5.0 - 8.0   Glucose, UA NEGATIVE NEGATIVE mg/dL   Hgb urine dipstick NEGATIVE NEGATIVE   Bilirubin Urine NEGATIVE NEGATIVE   Ketones, ur NEGATIVE NEGATIVE mg/dL   Protein, ur NEGATIVE NEGATIVE mg/dL   Nitrite NEGATIVE NEGATIVE   Leukocytes,Ua NEGATIVE NEGATIVE    Comment: Performed at [facility], [place], [place] [address]   CT ABDOMEN PELVIS W CONTRAST  Result Date: 05/10/2022 CLINICAL DATA:  Left lower quadrant pain over 2 weeks EXAM: CT ABDOMEN AND PELVIS WITH CONTRAST TECHNIQUE: Multidetector CT imaging of the abdomen and pelvis was performed using the standard protocol following bolus administration of intravenous contrast. RADIATION DOSE REDUCTION: This exam was performed according to the departmental dose-optimization program which includes automated exposure control, adjustment of the mA and/or kV according to patient size and/or use of iterative reconstruction technique. CONTRAST:  168m OMNIPAQUE IOHEXOL 300 MG/ML  SOLN COMPARISON:  09/25/2011 FINDINGS: Lower  chest: No acute abnormality. Hepatobiliary: 1 cm hypodensity is noted centrally in the right liver consistent with cyst. The gallbladder has been surgically removed. Pancreas: Unremarkable. No pancreatic ductal dilatation or surrounding inflammatory changes. Spleen: Normal in size without focal abnormality. Adrenals/Urinary Tract: Adrenal glands are within normal limits. Kidneys demonstrate a normal enhancement pattern. No renal calculi or obstructive changes are seen. The ureters are within normal limits. The bladder is partially distended. Stomach/Bowel: Minimal diverticular changes  noted without evidence of diverticulitis. No obstructive or inflammatory changes of the colon are seen. The appendix is mildly dilated to 13 mm with mild mucosal hyperemia. Some mild Peri appendiceal inflammatory changes are noted. Additionally an appendicolith is noted in the midportion of the appendix. These changes are consistent with acute appendicitis. Small bowel and stomach are unremarkable. Vascular/Lymphatic: No significant vascular findings are present. No enlarged abdominal or pelvic lymph nodes. Reproductive: Uterus and bilateral adnexa are unremarkable. Other: No abdominal wall hernia or abnormality. No abdominopelvic ascites. Musculoskeletal: Degenerative changes of lumbar spine are noted. IMPRESSION: Changes consistent with early acute appendicitis. No perforation or abscess formation is noted. Mild diverticular change without evidence of diverticulitis. Electronically Signed   By: Inez Catalina M.D.   On: 05/10/2022 01:50    Review of Systems  Constitutional:  Positive for appetite change.  HENT: Negative.    Eyes: Negative.   Respiratory: Negative.    Cardiovascular: Negative.   Gastrointestinal:  Positive for abdominal pain and nausea.  Endocrine: Negative.   Genitourinary: Negative.   Musculoskeletal: Negative.   Allergic/Immunologic: Negative.   Neurological: Negative.   Hematological: Negative.    Psychiatric/Behavioral: Negative.      Blood pressure 121/79, pulse 77, temperature 97.9 F (36.6 C), temperature source Oral, resp. rate 18, height '5\' 1"'$  (1.549 m), weight (!) 143.8 kg, last menstrual period 09/11/2020, SpO2 91 %. Physical Exam HENT:     Head: Normocephalic.  Cardiovascular:     Rate and Rhythm: Normal rate and regular rhythm.  Pulmonary:     Effort: Pulmonary effort is normal.     Breath sounds: Normal breath sounds.  Abdominal:     General: Abdomen is flat.     Palpations: Abdomen is soft.     Tenderness: There is abdominal tenderness in the right lower quadrant and left lower quadrant.  Skin:    General: Skin is warm.  Neurological:     Mental Status: She is alert and oriented to person, place, and time.  Psychiatric:        Mood and Affect: Mood normal.      Assessment/Plan Acute appendicitis -Rocephin and Flagyl IV.  Will proceed emergently to the operating room for laparoscopic appendectomy.  I discussed procedure, risks, and benefits with her in detail.  I answered her questions.  She is agreeable.  Zenovia Jarred, MD 05/10/2022, 11:28 AM

## 2022-05-10 NOTE — Anesthesia Preprocedure Evaluation (Addendum)
Anesthesia Evaluation  Patient identified by MRN, date of birth, ID band Patient awake    Reviewed: Allergy & Precautions, NPO status , Patient's Chart, lab work & pertinent test results  Airway Mallampati: III  TM Distance: >3 FB Neck ROM: Full    Dental no notable dental hx.    Pulmonary asthma , former smoker   Pulmonary exam normal        Cardiovascular hypertension, Pt. on medications Normal cardiovascular exam     Neuro/Psych negative neurological ROS  negative psych ROS   GI/Hepatic Neg liver ROS,GERD  Medicated and Controlled,,  Endo/Other    Morbid obesity  Renal/GU negative Renal ROS     Musculoskeletal  (+) Arthritis ,    Abdominal  (+) + obese  Peds  Hematology negative hematology ROS (+)   Anesthesia Other Findings APPENDICITIS  Reproductive/Obstetrics                             Anesthesia Physical Anesthesia Plan  ASA: 4  Anesthesia Plan: General   Post-op Pain Management:    Induction: Intravenous  PONV Risk Score and Plan: 4 or greater and Ondansetron, Dexamethasone, Propofol infusion, Midazolam and Treatment may vary due to age or medical condition  Airway Management Planned: Oral ETT  Additional Equipment:   Intra-op Plan:   Post-operative Plan: Extubation in OR  Informed Consent: I have reviewed the patients History and Physical, chart, labs and discussed the procedure including the risks, benefits and alternatives for the proposed anesthesia with the patient or authorized representative who has indicated his/her understanding and acceptance.     Dental advisory given  Plan Discussed with: CRNA  Anesthesia Plan Comments:        Anesthesia Quick Evaluation

## 2022-05-10 NOTE — ED Notes (Signed)
[facility] called to get equipment that will be transported with the patient...only IV.-ABB(NS)

## 2022-05-10 NOTE — ED Provider Notes (Signed)
[facility] Provider Note   CSN: [id number] Arrival date & time: 05/09/22  2050     History  Chief Complaint  Patient presents with   Abdominal Pain    Linda Cabrera is a 67 y.o. female.  Patient is a 67 year old female with past medical history of hypertension, GERD, anemia.  Patient presenting today for evaluation of left lower quadrant pain.  She states that this has been ongoing for the last 2 weeks.  She describes trouble having a bowel movement and also episodes of vomiting.  She was seen by her gastroenterologist and prescribed some sort of medication that she does not recall, however this is not helping.  She denies any fevers or chills.  She denies any urinary complaints.  There are no alleviating factors.  The history is provided by the patient.       Home Medications Prior to Admission medications   Medication Sig Start Date End Date Taking? Authorizing Provider  amLODipine (NORVASC) 10 MG tablet Take 10 mg by mouth daily. 08/14/21   [provider]  aspirin EC 81 MG tablet Take 1 tablet (81 mg total) by mouth daily. 07/11/19   Hoyt Koch, MD  aspirin-acetaminophen-caffeine (EXCEDRIN MIGRAINE) [phone] MG tablet Take 2 tablets by mouth daily as needed for headache.    [provider]  chlorthalidone (HYGROTON) 25 MG tablet Take 1 tablet (25 mg total) by mouth daily. 07/11/21   Hoyt Koch, MD  Fluticasone Propionate, Inhal, (FLOVENT DISKUS) 100 MCG/ACT AEPB 1 inhalation twice per day 02/04/22   Biagio Borg, MD  Fluticasone-Umeclidin-Vilant (TRELEGY ELLIPTA) 100-62.5-25 MCG/ACT AEPB Inhale 1 puff into the lungs daily. 04/18/21   Hoyt Koch, MD  ipratropium-albuterol (DUONEB) 0.5-2.5 (3) MG/3ML SOLN Take 3 mLs by nebulization every 6 (six) hours as needed (shortness of breath, wheezing). 02/26/21   [place] Donnamarie Poag, DO  magnesium hydroxide (DULCOLAX MILK OF MAGNESIA) 400 MG/5ML  suspension Take 22.5 mLs by mouth daily as needed (constipation). Dulcolax Liquid    [provider]  pantoprazole (PROTONIX) 40 MG tablet TAKE 1 TABLET BY MOUTH EVERY DAY 10/02/21   Hoyt Koch, MD  sucralfate (CARAFATE) 1 g tablet Take 1 tablet (1 g total) by mouth 4 (four) times daily -  with meals and at bedtime. 05/02/22   Zehr, Laban Emperor, PA-C  valACYclovir (VALTREX) 1000 MG tablet TAKE 1 TABLET BY MOUTH EVERY DAY 02/04/22   Hoyt Koch, MD      Allergies    Ace inhibitors and Lisinopril    Review of Systems   Review of Systems  All other systems reviewed and are negative.   Physical Exam Updated Vital Signs BP (!) 147/89   Pulse 80   Temp 98.4 F (36.9 C) (Oral)   Resp (!) 21   Ht '5\' 1"'$  (1.549 m)   Wt (!) 143.8 kg   LMP 09/11/2020 Comment: BTL  SpO2 95%   BMI 59.90 kg/m  Physical Exam Vitals and nursing note reviewed.  Constitutional:      General: She is not in acute distress.    Appearance: She is well-developed. She is not diaphoretic.  HENT:     Head: Normocephalic and atraumatic.  Cardiovascular:     Rate and Rhythm: Normal rate and regular rhythm.     Heart sounds: No murmur heard.    No friction rub. No gallop.  Pulmonary:     Effort: Pulmonary effort is normal. No respiratory  distress.     Breath sounds: Normal breath sounds. No wheezing.  Abdominal:     General: Bowel sounds are normal. There is no distension.     Palpations: Abdomen is soft.     Tenderness: There is abdominal tenderness in the left lower quadrant. There is no right CVA tenderness, left CVA tenderness, guarding or rebound.  Musculoskeletal:        General: Normal range of motion.     Cervical back: Normal range of motion and neck supple.  Skin:    General: Skin is warm and dry.  Neurological:     General: No focal deficit present.     Mental Status: She is alert and oriented to person, place, and time.     ED Results / Procedures / Treatments    Labs (all labs ordered are listed, but only abnormal results are displayed) Labs Reviewed  COMPREHENSIVE METABOLIC PANEL - Abnormal; Notable for the following components:      Result Value   Potassium 3.2 (*)    Glucose, Bld 120 (*)    Total Protein 8.4 (*)    AST 13 (*)    All other components within normal limits  CBC - Abnormal; Notable for the following components:   WBC 15.2 (*)    RBC 5.51 (*)    HCT 46.3 (*)    RDW 17.8 (*)    All other components within normal limits  LIPASE, BLOOD  URINALYSIS, ROUTINE W REFLEX MICROSCOPIC    EKG None  Radiology No results found.  Procedures Procedures    Medications Ordered in ED Medications  HYDROmorphone (DILAUDID) injection 1 mg (1 mg Intravenous Given 05/10/22 0023)  ondansetron (ZOFRAN) injection 4 mg (4 mg Intravenous Given 05/10/22 0024)    ED Course/ Medical Decision Making/ A&P  Patient is a 67 year old female presenting with complaints of lower abdominal pain.  She describes a 2-week history of intermittent left lower quadrant pain which became worse this evening.  She arrives here with a temp of 100 and tenderness to palpation in the left lower quadrant.  Workup initiated including CBC, metabolic panel, and urinalysis.  Patient has a leukocytosis with white count of 15,000, but labs are otherwise unremarkable.  CT scan obtained shows findings consistent with acute appendicitis.  Patient care discussed with Dr. Zenia Resides from general surgery.  She has recommended Zosyn and will call for the patient to be taken to same-day for surgical repair in the morning.  Patient is also received morphine for pain and Zofran for nausea.  Maintenance fluids ordered.  Final Clinical Impression(s) / ED Diagnoses Final diagnoses:  None    Rx / DC Orders ED Discharge Orders     None         Veryl Speak, MD 05/10/22 0230

## 2022-05-10 NOTE — Anesthesia Procedure Notes (Signed)
Procedure Name: Intubation Date/Time: 05/10/2022 12:10 PM  Performed by: Anastasio Auerbach, CRNAPre-anesthesia Checklist: Patient identified, Emergency Drugs available, Suction available and Patient being monitored Patient Re-evaluated:Patient Re-evaluated prior to induction Oxygen Delivery Method: Circle system utilized Preoxygenation: Pre-oxygenation with 100% oxygen Induction Type: IV induction, Rapid sequence and Cricoid Pressure applied Laryngoscope Size: Glidescope and 3 Grade View: Grade I Tube type: Oral Number of attempts: 1 Airway Equipment and Method: Stylet and Oral airway Placement Confirmation: ETT inserted through vocal cords under direct vision, positive ETCO2 and breath sounds checked- equal and bilateral Secured at: 22 cm Tube secured with: Tape Dental Injury: Teeth and Oropharynx as per pre-operative assessment

## 2022-05-10 NOTE — ED Notes (Signed)
Attempt to call report for OR  No answer.

## 2022-05-10 NOTE — Op Note (Signed)
  05/10/2022  1:14 PM  PATIENT:  Linda Cabrera  67 y.o. female  PRE-OPERATIVE DIAGNOSIS:  APPENDICITIS  POST-OPERATIVE DIAGNOSIS:  APPENDICITIS  PROCEDURE:  Procedure(s): APPENDECTOMY LAPAROSCOPIC  SURGEON:  Surgeon(s): Georganna Skeans, MD  ASSISTANTS: none   ANESTHESIA:   local and general  EBL:  Total I/O In: 200 [IV Piggyback:200] Out: 310 [Urine:300; Blood:10]  BLOOD ADMINISTERED:none  DRAINS: none   SPECIMEN:  Excision  DISPOSITION OF SPECIMEN:  PATHOLOGY  COUNTS:  YES  DICTATION: .Dragon Dictation Procedure detail: Informed consent was obtained.  She received intravenous antibiotics.  She was brought to the operating room and general endotracheal anesthesia was administered by the anesthesia staff.  Her abdomen was prepped and draped in a sterile fashion.  We did a timeout procedure.  Supraumbilical region was infiltrated with local.  I made a supraumbilical incision and subcutaneous tissues were dissected down.  Due to her morbid obesity, the abdominal wall was very thick.  I was able to palpate the fascia.  I grabbed it with a Coker in 2 places and divided the fascia between them.  I then gained access into the abdominal cavity gently and under direct palpation placed a 12 mm bariatric trocar.  The abdomen was insufflated with carbon oxide.  The camera was inserted and there were no complications from the port placement.  Under direct vision, I placed a 12 mm right lower quadrant bariatric port and a 5 mm right abdominal bariatric port.  Laparoscopic exploration revealed the appendix wrapping up retrocecally.  I mobilized the right colon using the harmonic scalpel.  I then was able to free up the appendix.  The small mesoappendix was divided with harmonic scalpel and the base of the appendix was then divided with Endo GIA with a vascular load.  There was excellent staple line closure.  The appendix was placed in a bag and removed from the abdomen.  It was sent to pathology.   The right lower quadrant was copiously irrigated with saline.  There was good hemostasis.  The staple line was intact.  Irrigation fluid was evacuated.  Ports were removed under direct vision.  I removed the supraumbilical port and then used a PMI and a 0 Vicryl suture to close the fascia.  The other ports were then removed after evacuating the pneumoperitoneum.  The wounds were irrigated and each was closed with 4-0 Vicryl followed by Dermabond.  All counts were correct.  She tolerated the procedure well without apparent complication and was taken recovery in stable condition.  PATIENT DISPOSITION:  PACU - hemodynamically stable.   Delay start of Pharmacological VTE agent (>24hrs) due to surgical blood loss or risk of bleeding:  no  Georganna Skeans, MD, MPH, FACS Pager: [phone]  1/27/20241:14 PM

## 2022-05-10 NOTE — Transfer of Care (Signed)
Immediate Anesthesia Transfer of Care Note  Patient: Linda Cabrera  Procedure(s) Performed: APPENDECTOMY LAPAROSCOPIC  Patient Location: PACU  Anesthesia Type:General  Level of Consciousness: awake, oriented, and drowsy  Airway & Oxygen Therapy: Patient Spontanous Breathing and Patient connected to nasal cannula oxygen  Post-op Assessment: Report given to RN and Post -op Vital signs reviewed and stable  Post vital signs: Reviewed and stable  Last Vitals:  Vitals Value Taken Time  BP 128/80 05/10/22 1324  Temp    Pulse 86 05/10/22 1328  Resp 17 05/10/22 1328  SpO2 88 % 05/10/22 1328  Vitals shown include unvalidated device data.  Last Pain:  Vitals:   05/10/22 1124  TempSrc:   PainSc: 8          Complications: No notable events documented.

## 2022-05-11 ENCOUNTER — Encounter (HOSPITAL_COMMUNITY): Payer: Self-pay | Admitting: General Surgery

## 2022-05-12 ENCOUNTER — Ambulatory Visit: Payer: Commercial Managed Care - HMO | Admitting: Obstetrics & Gynecology

## 2022-05-14 ENCOUNTER — Other Ambulatory Visit: Payer: Self-pay | Admitting: Internal Medicine

## 2022-05-14 LAB — SURGICAL PATHOLOGY

## 2022-05-26 ENCOUNTER — Other Ambulatory Visit: Payer: Self-pay | Admitting: Gastroenterology

## 2022-06-17 ENCOUNTER — Encounter (HOSPITAL_COMMUNITY): Payer: Self-pay | Admitting: Internal Medicine

## 2022-06-18 ENCOUNTER — Telehealth: Payer: Self-pay | Admitting: Internal Medicine

## 2022-06-18 NOTE — Telephone Encounter (Signed)
Discussed with pt that she can have them bill her. She will keep the appt as scheduled.

## 2022-06-18 NOTE — Telephone Encounter (Signed)
PT has an EGD scheduled at [facility] for  3/12 and needs to have it canceled because she was told she had to pay $350 to have it done. Please advise.

## 2022-06-23 DIAGNOSIS — M1990 Unspecified osteoarthritis, unspecified site: Secondary | ICD-10-CM | POA: Diagnosis not present

## 2022-06-23 DIAGNOSIS — K219 Gastro-esophageal reflux disease without esophagitis: Secondary | ICD-10-CM | POA: Diagnosis not present

## 2022-06-23 DIAGNOSIS — R1032 Left lower quadrant pain: Secondary | ICD-10-CM | POA: Diagnosis not present

## 2022-06-23 DIAGNOSIS — Z6841 Body Mass Index (BMI) 40.0 and over, adult: Secondary | ICD-10-CM | POA: Diagnosis not present

## 2022-06-23 DIAGNOSIS — R7303 Prediabetes: Secondary | ICD-10-CM | POA: Diagnosis not present

## 2022-06-23 DIAGNOSIS — I10 Essential (primary) hypertension: Secondary | ICD-10-CM | POA: Diagnosis not present

## 2022-06-23 DIAGNOSIS — J45909 Unspecified asthma, uncomplicated: Secondary | ICD-10-CM | POA: Diagnosis not present

## 2022-06-24 ENCOUNTER — Encounter (HOSPITAL_COMMUNITY): Payer: Self-pay | Admitting: Internal Medicine

## 2022-06-24 ENCOUNTER — Other Ambulatory Visit: Payer: Self-pay

## 2022-06-24 ENCOUNTER — Ambulatory Visit (HOSPITAL_BASED_OUTPATIENT_CLINIC_OR_DEPARTMENT_OTHER): Payer: Medicare PPO | Admitting: Anesthesiology

## 2022-06-24 ENCOUNTER — Ambulatory Visit (HOSPITAL_COMMUNITY)
Admission: RE | Admit: 2022-06-24 | Discharge: 2022-06-24 | Disposition: A | Discharge status: Home / Self Care | Payer: Medicare PPO | Attending: Internal Medicine | Admitting: Internal Medicine

## 2022-06-24 ENCOUNTER — Encounter (HOSPITAL_COMMUNITY)
Admission: RE | Disposition: A | Discharge status: Home / Self Care | Payer: Self-pay | Source: Home / Self Care | Attending: Internal Medicine

## 2022-06-24 ENCOUNTER — Ambulatory Visit (HOSPITAL_COMMUNITY): Payer: Medicare PPO | Admitting: Anesthesiology

## 2022-06-24 DIAGNOSIS — R1319 Other dysphagia: Secondary | ICD-10-CM | POA: Diagnosis not present

## 2022-06-24 DIAGNOSIS — K3189 Other diseases of stomach and duodenum: Secondary | ICD-10-CM

## 2022-06-24 DIAGNOSIS — J45909 Unspecified asthma, uncomplicated: Secondary | ICD-10-CM | POA: Diagnosis not present

## 2022-06-24 DIAGNOSIS — K219 Gastro-esophageal reflux disease without esophagitis: Secondary | ICD-10-CM | POA: Diagnosis not present

## 2022-06-24 DIAGNOSIS — R1310 Dysphagia, unspecified: Secondary | ICD-10-CM | POA: Diagnosis not present

## 2022-06-24 DIAGNOSIS — R1013 Epigastric pain: Secondary | ICD-10-CM

## 2022-06-24 DIAGNOSIS — Z79899 Other long term (current) drug therapy: Secondary | ICD-10-CM | POA: Insufficient documentation

## 2022-06-24 DIAGNOSIS — I10 Essential (primary) hypertension: Secondary | ICD-10-CM | POA: Diagnosis not present

## 2022-06-24 DIAGNOSIS — Z800 Family history of malignant neoplasm of digestive organs: Secondary | ICD-10-CM | POA: Insufficient documentation

## 2022-06-24 DIAGNOSIS — Z6841 Body Mass Index (BMI) 40.0 and over, adult: Secondary | ICD-10-CM | POA: Insufficient documentation

## 2022-06-24 DIAGNOSIS — Z87891 Personal history of nicotine dependence: Secondary | ICD-10-CM

## 2022-06-24 HISTORY — PX: BIOPSY: SHX5522

## 2022-06-24 HISTORY — PX: ESOPHAGOGASTRODUODENOSCOPY (EGD) WITH PROPOFOL: SHX5813

## 2022-06-24 HISTORY — PX: ESOPHAGEAL DILATION: SHX303

## 2022-06-24 SURGERY — ESOPHAGOGASTRODUODENOSCOPY (EGD) WITH PROPOFOL
Anesthesia: Monitor Anesthesia Care

## 2022-06-24 MED ORDER — SODIUM CHLORIDE 0.9 % IV SOLN: INTRAVENOUS | Status: DC

## 2022-06-24 MED ORDER — PROPOFOL 10 MG/ML IV BOLUS
INTRAVENOUS | Status: DC | PRN
  Administered 2022-06-24: 50 mg via INTRAVENOUS

## 2022-06-24 MED ORDER — PROPOFOL 500 MG/50ML IV EMUL
INTRAVENOUS | Status: DC | PRN
  Administered 2022-06-24: 150 ug/kg/min via INTRAVENOUS

## 2022-06-24 MED ORDER — LIDOCAINE HCL (CARDIAC) PF 100 MG/5ML IV SOSY
PREFILLED_SYRINGE | INTRAVENOUS | Status: DC | PRN
  Administered 2022-06-24: 100 mg via INTRAVENOUS

## 2022-06-24 MED ORDER — LACTATED RINGERS IV SOLN: INTRAVENOUS | Status: DC

## 2022-06-24 SURGICAL SUPPLY — 15 items

## 2022-06-24 NOTE — Op Note (Signed)
[facility] Patient Name: Linda Cabrera Procedure Date: 06/24/2022 MRN: [id number] Attending MD: Jerene Bears , MD, [id number] Date of Birth: 08-10-1955 CSN: [id number] Age: 67 Admit Type: Outpatient Procedure:                Upper GI endoscopy Indications:              Dysphagia, Gastro-esophageal reflux disease,                            despite pantoprazole 40 mg daily Providers:                Lajuan Lines. Hilarie Fredrickson, MD, Elmer Ramp. Tilden Dome, RN, Janie                            Billups, Piedra Aguza regory CRNA Referring MD:             Collene Leyden Medicines:                Monitored Anesthesia Care Complications:            No immediate complications. Estimated Blood Loss:     Estimated blood loss was minimal. Procedure:                Pre-Anesthesia Assessment:                           - Prior to the procedure, a History and Physical                            was performed, and patient medications and                            allergies were reviewed. The patient's tolerance of                            previous anesthesia was also reviewed. The risks                            and benefits of the procedure and the sedation                            options and risks were discussed with the patient.                            All questions were answered, and informed consent                            was obtained. Prior Anticoagulants: The patient has                            taken no anticoagulant or antiplatelet agents. ASA                            Grade Assessment: III - A patient with severe  systemic disease. After reviewing the risks and                            benefits, the patient was deemed in satisfactory                            condition to undergo the procedure.                           After obtaining informed consent, the endoscope was                            passed under direct vision. Throughout the                             procedure, the patient's blood pressure, pulse, and                            oxygen saturations were monitored continuously. The                            [id number] Olympus endoscope was introduced                            through the mouth, and advanced to the second part                            of duodenum. The upper GI endoscopy was                            accomplished without difficulty. The patient                            tolerated the procedure well. Scope In: Scope Out: Findings:      The examined esophagus was normal. Biopsies were taken with a cold       forceps for histology to evaluate for uncontrolled acid reflux and       exclude EoE (though no convincing endoscopist evidence of EoE).      No endoscopic abnormality was evident in the esophagus to explain the       patient's complaint of dysphagia. It was decided, however, to proceed       with dilation of the middle third of the esophagus, of the lower third       of the esophagus and at the gastroesophageal junction. A TTS dilator was       passed through the scope. Dilation with an 18-19-20 mm balloon dilator       was performed to 19 mm. The dilation site was examined and showed no       change.      Patchy mildly erythematous mucosa without bleeding was found in the       gastric fundus. Biopsies were taken with a cold forceps for Helicobacter       pylori testing.      The exam of the stomach was otherwise normal.      The examined duodenum was normal.  Impression:               - Normal esophagus. Biopsied to exclude                            uncontrolled acid reflux and eosinophilic                            inflammation.                           - No endoscopic esophageal abnormality to explain                            patient's dysphagia. Esophagus dilated to 20 mm                            with balloon.                           - Erythematous mucosa in the gastric  fundus.                            Biopsied to exclude H. Pylori.                           - Normal examined duodenum. Moderate Sedation:      N/A Recommendation:           - Patient has a contact number available for                            emergencies. The signs and symptoms of potential                            delayed complications were discussed with the                            patient. Return to normal activities tomorrow.                            Written discharge instructions were provided to the                            patient.                           - Resume previous diet.                           - Continue present medications.                           - Await pathology results.                           - If persistent dysphagia barium esophagram and  further evaluation of esophageal motility can be                            considered. Procedure Code(s):        --- Professional ---                           [id number], Esophagogastroduodenoscopy, flexible,                            transoral; with transendoscopic balloon dilation of                            esophagus (less than 30 mm diameter)                           43239, 59, Esophagogastroduodenoscopy, flexible,                            transoral; with biopsy, single or multiple Diagnosis Code(s):        --- Professional ---                           R13.10, Dysphagia, unspecified                           K31.89, Other diseases of stomach and duodenum                           K21.9, Gastro-esophageal reflux disease without                            esophagitis CPT copyright 2022 American [employer] Association. All rights reserved. The codes documented in this report are preliminary and upon coder review may  be revised to meet current compliance requirements. Jerene Bears, MD 06/24/2022 11:36:14 AM This report has been signed electronically. Number of Addenda: 0

## 2022-06-24 NOTE — H&P (Signed)
GASTROENTEROLOGY PROCEDURE H&P NOTE   Primary Care Physician: Collene Leyden, MD    Reason for Procedure:  GERD with solid food dysphagia  Plan:    Upper endoscopy   The nature of the procedure, as well as the risks, benefits, and alternatives were carefully and thoroughly reviewed with the patient. Ample time for discussion and questions allowed. The patient understood, was satisfied, and agreed to proceed.     HPI: Linda Cabrera is a 67 y.o. female who presents for EGD with possible dilation.  Medical history as below.  Tolerated the prep.  No recent chest pain or shortness of breath.  No abdominal pain today.  Past Medical History:  Diagnosis Date   Anemia, unspecified 07/13/2015   Arthritis    Asthma    Dyspnea    Hernia, umbilical    Herpes    Hypertension    Pre-diabetes     Past Surgical History:  Procedure Laterality Date   BREATH TEK H PYLORI N/A 02/13/2014   Procedure: BREATH TEK H PYLORI;  Surgeon: Gayland Curry, MD;  Location: [facility];  Service: General;  Laterality: N/A;   CHOLECYSTECTOMY N/A 04/29/2019   Procedure: LAPAROSCOPIC CHOLECYSTECTOMY;  Surgeon: Mickeal Skinner, MD;  Location: [facility];  Service: General;  Laterality: N/A;   COLONOSCOPY N/A 08/15/2013   Procedure: COLONOSCOPY;  Surgeon: Jerene Bears, MD;  Location: [facility];  Service: Gastroenterology;  Laterality: N/A;   DILATATION & CURETTAGE/HYSTEROSCOPY WITH MYOSURE N/A 01/07/2021   Procedure: [place];  Surgeon: [name], MD;  Location: [facility];  Service: Gynecology;  Laterality: N/A;   LAPAROSCOPIC APPENDECTOMY N/A 05/10/2022   Procedure: APPENDECTOMY LAPAROSCOPIC;  Surgeon: Georganna Skeans, MD;  Location: [facility];  Service: General;  Laterality: N/A;   TONSILLECTOMY     TUBAL LIGATION      Prior to Admission medications   Medication Sig Start Date End Date Taking? Authorizing Provider  amLODipine (NORVASC) 10 MG tablet Take 10 mg  by mouth daily. 08/14/21  Yes [provider]  aspirin EC 81 MG tablet Take 1 tablet (81 mg total) by mouth daily. 07/11/19  Yes Hoyt Koch, MD  chlorthalidone (HYGROTON) 25 MG tablet Take 1 tablet (25 mg total) by mouth daily. 07/11/21  Yes Hoyt Koch, MD  Fluticasone-Umeclidin-Vilant (TRELEGY ELLIPTA) 100-62.5-25 MCG/ACT AEPB Inhale 1 puff into the lungs daily. 04/18/21  Yes Hoyt Koch, MD  magnesium hydroxide (DULCOLAX MILK OF MAGNESIA) 400 MG/5ML suspension Take 22.5 mLs by mouth daily as needed (constipation). Dulcolax Liquid   Yes [provider]  pantoprazole (PROTONIX) 40 MG tablet TAKE 1 TABLET BY MOUTH EVERY DAY 10/02/21  Yes Hoyt Koch, MD  valACYclovir (VALTREX) 1000 MG tablet TAKE 1 TABLET BY MOUTH EVERY DAY 05/15/22  Yes Hoyt Koch, MD  aspirin-acetaminophen-caffeine (EXCEDRIN MIGRAINE) [phone] MG tablet Take 2 tablets by mouth daily as needed for headache.    [provider]  Fluticasone Propionate, Inhal, (FLOVENT DISKUS) 100 MCG/ACT AEPB 1 inhalation twice per day Patient not taking: Reported on 06/20/2022 02/04/22   Biagio Borg, MD  ipratropium-albuterol (DUONEB) 0.5-2.5 (3) MG/3ML SOLN Take 3 mLs by nebulization every 6 (six) hours as needed (shortness of breath, wheezing). 02/26/21   [place] Donnamarie Poag, DO  oxyCODONE (OXY IR/ROXICODONE) 5 MG immediate release tablet Take 1 tablet (5 mg total) by mouth every 6 (six) hours as needed for severe pain. Patient not taking: Reported on 06/20/2022 05/10/22   [place] Silos,  Lavone Neri, MD  sucralfate (CARAFATE) 1 g tablet TAKE 1 TABLET (1 G TOTAL) BY MOUTH 4 TIMES A DAY WITH MEALS AND AT BEDTIME Patient not taking: Reported on 06/20/2022 05/26/22   Zehr, Laban Emperor, PA-C    Current Facility-Administered Medications  Medication Dose Route Frequency Provider Last Rate Last Admin   0.9 %  sodium chloride infusion   Intravenous Continuous Zehr, Jessica D, PA-C       lactated ringers  infusion   Intravenous Continuous Dontrel Smethers, Lajuan Lines, MD 10 mL/hr at 06/24/22 0928 New Bag at 06/24/22 0928    Allergies as of 05/02/2022 - Review Complete 05/02/2022  Allergen Reaction Noted   Ace inhibitors Swelling 04/24/2011   Lisinopril Swelling 05/02/2022    Family History  Problem Relation Age of Onset   Arthritis Mother    Hypertension Mother    Cancer Mother        pancreatic   Pancreatic cancer Mother    Arthritis Father    Hypertension Father    Colon cancer Neg Hx     Social History   Socioeconomic History   Marital status: Married    Spouse name: Not on file   Number of children: Not on file   Years of education: Not on file   Highest education level: Not on file  Occupational History   Not on file  Tobacco Use   Smoking status: Former    Types: Cigarettes    Quit date: 06/16/2004    Years since quitting: 18.0   Smokeless tobacco: Never  Vaping Use   Vaping Use: Never used  Substance and Sexual Activity   Alcohol use: Yes    Alcohol/week: 4.0 standard drinks of alcohol    Types: 4 Glasses of wine per week   Drug use: Not Currently    Comment: remote h/o crack cocaine   Sexual activity: Not Currently    Partners: Male    Birth control/protection: Surgical, Post-menopausal    Comment: BTL  Other Topics Concern   Not on file  Social History Narrative   Not on file   Social Determinants of Health   Financial Resource Strain: Not on file  Food Insecurity: Not on file  Transportation Needs: Not on file  Physical Activity: Not on file  Stress: Not on file  Social Connections: Not on file  Intimate Partner Violence: Not on file    Physical Exam: Vital signs in last 24 hours: '@BP'$  (!) 141/91   Pulse 74   Temp (!) 96.9 F (36.1 C) (Temporal)   Resp 17   Ht '5\' 1"'$  (1.549 m)   Wt (!) 141.5 kg   LMP 09/11/2020 Comment: BTL  SpO2 97%   BMI 58.95 kg/m  GEN: NAD EYE: Sclerae anicteric ENT: MMM CV: Non-tachycardic Pulm: CTA b/l GI: Soft,  NT/ND NEURO:  Alert & Oriented x 3   Zenovia Jarred, MD [facility]  06/24/2022 10:59 AM

## 2022-06-24 NOTE — Discharge Instructions (Signed)
YOU HAD AN ENDOSCOPIC PROCEDURE TODAY: Refer to the procedure report and other information in the discharge instructions given to you for any specific questions about what was found during the examination. If this information does not answer your questions, please call [place] office at [phone] to clarify.   YOU SHOULD EXPECT: Some feelings of bloating in the abdomen. Passage of more gas than usual. Walking can help get rid of the air that was put into your GI tract during the procedure and reduce the bloating. If you had a lower endoscopy (such as a colonoscopy or flexible sigmoidoscopy) you may notice spotting of blood in your stool or on the toilet paper. Some abdominal soreness may be present for a day or two, also.  DIET: Your first meal following the procedure should be a light meal and then it is ok to progress to your normal diet. A half-sandwich or bowl of soup is an example of a good first meal. Heavy or fried foods are harder to digest and may make you feel nauseous or bloated. Drink plenty of fluids but you should avoid alcoholic beverages for 24 hours. If you had a esophageal dilation, please see attached instructions for diet.    ACTIVITY: Your care partner should take you home directly after the procedure. You should plan to take it easy, moving slowly for the rest of the day. You can resume normal activity the day after the procedure however YOU SHOULD NOT DRIVE, use power tools, machinery or perform tasks that involve climbing or major physical exertion for 24 hours (because of the sedation medicines used during the test).   SYMPTOMS TO REPORT IMMEDIATELY: A gastroenterologist can be reached at any hour. Please call [phone]  for any of the following symptoms:   Following upper endoscopy (EGD, EUS, ERCP, esophageal dilation) Vomiting of blood or coffee ground material  New, significant abdominal pain  New, significant chest pain or pain under the shoulder blades  Painful or  persistently difficult swallowing  New shortness of breath  Black, tarry-looking or red, bloody stools  FOLLOW UP:  If any biopsies were taken you will be contacted by phone or by letter within the next 1-3 weeks. Call [phone]  if you have not heard about the biopsies in 3 weeks.  Please also call with any specific questions about appointments or follow up tests.  

## 2022-06-24 NOTE — Anesthesia Preprocedure Evaluation (Addendum)
Anesthesia Evaluation  Patient identified by MRN, date of birth, ID band Patient awake    Reviewed: Allergy & Precautions, NPO status , Patient's Chart, lab work & pertinent test results  Airway Mallampati: II  TM Distance: >3 FB Neck ROM: Full    Dental no notable dental hx. (+) Dental Advisory Given, Teeth Intact   Pulmonary shortness of breath, asthma , former smoker   Pulmonary exam normal breath sounds clear to auscultation       Cardiovascular hypertension, Pt. on medications Normal cardiovascular exam Rhythm:Regular Rate:Normal     Neuro/Psych negative neurological ROS     GI/Hepatic Neg liver ROS,GERD  ,,  Endo/Other    Morbid obesity  Renal/GU negative Renal ROS     Musculoskeletal  (+) Arthritis ,    Abdominal  (+) + obese  Peds  Hematology  (+) Blood dyscrasia, anemia   Anesthesia Other Findings   Reproductive/Obstetrics                             Anesthesia Physical Anesthesia Plan  ASA: 4  Anesthesia Plan: MAC   Post-op Pain Management: Minimal or no pain anticipated   Induction: Intravenous  PONV Risk Score and Plan: Ondansetron, Propofol infusion, Treatment may vary due to age or medical condition and TIVA  Airway Management Planned: Simple Face Mask  Additional Equipment:   Intra-op Plan:   Post-operative Plan:   Informed Consent: I have reviewed the patients History and Physical, chart, labs and discussed the procedure including the risks, benefits and alternatives for the proposed anesthesia with the patient or authorized representative who has indicated his/her understanding and acceptance.     Dental advisory given  Plan Discussed with: CRNA  Anesthesia Plan Comments:        Anesthesia Quick Evaluation

## 2022-06-24 NOTE — Transfer of Care (Signed)
Immediate Anesthesia Transfer of Care Note  Patient: Linda Cabrera  Procedure(s) Performed: ESOPHAGOGASTRODUODENOSCOPY (EGD) WITH PROPOFOL BIOPSY ESOPHAGEAL DILATION  Patient Location: Short Stay  Anesthesia Type:MAC  Level of Consciousness: awake, alert , oriented, and patient cooperative  Airway & Oxygen Therapy: Patient Spontanous Breathing and Patient connected to face mask oxygen  Post-op Assessment: Report given to RN, Post -op Vital signs reviewed and stable, and Patient moving all extremities X 4  Post vital signs: Reviewed and stable  Last Vitals:  Vitals Value Taken Time  BP 95/64 06/24/22 1136  Temp    Pulse 77 06/24/22 1139  Resp 21 06/24/22 1139  SpO2 100 % 06/24/22 1139  Vitals shown include unvalidated device data.  Last Pain:  Vitals:   06/24/22 0921  TempSrc: Temporal  PainSc: 0-No pain         Complications: No notable events documented.

## 2022-06-25 ENCOUNTER — Other Ambulatory Visit: Payer: Self-pay | Admitting: Family Medicine

## 2022-06-25 DIAGNOSIS — R1032 Left lower quadrant pain: Secondary | ICD-10-CM

## 2022-06-25 LAB — SURGICAL PATHOLOGY

## 2022-06-25 NOTE — Anesthesia Postprocedure Evaluation (Signed)
Anesthesia Post Note  Patient: Linda Cabrera  Procedure(s) Performed: ESOPHAGOGASTRODUODENOSCOPY (EGD) WITH PROPOFOL BIOPSY ESOPHAGEAL DILATION     Patient location during evaluation: PACU Anesthesia Type: MAC Level of consciousness: awake and alert Pain management: pain level controlled Vital Signs Assessment: post-procedure vital signs reviewed and stable Respiratory status: spontaneous breathing Cardiovascular status: stable Anesthetic complications: no   No notable events documented.  Last Vitals:  Vitals:   06/24/22 1140 06/24/22 1150  BP: (!) 143/86 (!) 155/84  Pulse: 75 72  Resp: 18 16  Temp:    SpO2: 100% 96%    Last Pain:  Vitals:   06/24/22 1150  TempSrc:   PainSc: 0-No pain                 Nolon Nations

## 2022-06-27 ENCOUNTER — Encounter (HOSPITAL_COMMUNITY): Payer: Self-pay | Admitting: Internal Medicine

## 2022-06-27 ENCOUNTER — Other Ambulatory Visit: Payer: Self-pay | Admitting: Family Medicine

## 2022-06-27 DIAGNOSIS — Z1231 Encounter for screening mammogram for malignant neoplasm of breast: Secondary | ICD-10-CM

## 2022-06-30 ENCOUNTER — Encounter: Payer: Self-pay | Admitting: Internal Medicine

## 2022-07-12 ENCOUNTER — Other Ambulatory Visit: Payer: Self-pay | Admitting: Internal Medicine

## 2022-07-16 ENCOUNTER — Ambulatory Visit
Admission: RE | Admit: 2022-07-16 | Discharge: 2022-07-16 | Disposition: A | Discharge status: Home / Self Care | Payer: Medicare PPO | Source: Ambulatory Visit | Attending: Family Medicine | Admitting: Family Medicine

## 2022-07-16 DIAGNOSIS — Z9889 Other specified postprocedural states: Secondary | ICD-10-CM | POA: Diagnosis not present

## 2022-07-16 DIAGNOSIS — R1032 Left lower quadrant pain: Secondary | ICD-10-CM

## 2022-07-16 DIAGNOSIS — Z9049 Acquired absence of other specified parts of digestive tract: Secondary | ICD-10-CM | POA: Diagnosis not present

## 2022-07-16 DIAGNOSIS — K5730 Diverticulosis of large intestine without perforation or abscess without bleeding: Secondary | ICD-10-CM | POA: Diagnosis not present

## 2022-07-16 MED ORDER — IOPAMIDOL (ISOVUE-300) INJECTION 61%
100.0000 mL | Freq: Once | INTRAVENOUS | Status: AC | PRN
  Administered 2022-07-16: 100 mL via INTRAVENOUS

## 2022-07-17 ENCOUNTER — Ambulatory Visit: Payer: Medicare PPO | Admitting: Obstetrics & Gynecology

## 2022-07-17 ENCOUNTER — Other Ambulatory Visit: Payer: Self-pay | Admitting: Internal Medicine

## 2022-07-17 ENCOUNTER — Other Ambulatory Visit (HOSPITAL_COMMUNITY)
Admission: RE | Admit: 2022-07-17 | Discharge: 2022-07-17 | Disposition: A | Discharge status: Home / Self Care | Payer: Medicare PPO | Source: Ambulatory Visit | Attending: Obstetrics & Gynecology | Admitting: Obstetrics & Gynecology

## 2022-07-17 ENCOUNTER — Encounter: Payer: Self-pay | Admitting: Obstetrics & Gynecology

## 2022-07-17 VITALS — BP 114/80 | HR 78

## 2022-07-17 DIAGNOSIS — N858 Other specified noninflammatory disorders of uterus: Secondary | ICD-10-CM | POA: Diagnosis not present

## 2022-07-17 DIAGNOSIS — N950 Postmenopausal bleeding: Secondary | ICD-10-CM

## 2022-07-17 NOTE — Progress Notes (Signed)
     Linda Cabrera [birth year] [id number]        67 y.o.  G1P1001 S/P BTL  RP: PMB in January 2024 and last week for EBx  HPI: Postmenopause on no HRT.  Obesity.  PMB in 04/2022 and again last week. Had a Pelvic US for the same reason on November 9th 2023 showing a thin and symmetrical endometrial line measured at 2.56 mm with no mass or thickening or abnormal blood flow seen.  Will do an EBx today.   OB History  Gravida Para Term Preterm AB Living  1 1 1     1   SAB IAB Ectopic Multiple Live Births               # Outcome Date GA Lbr Len/2nd Weight Sex Delivery Anes PTL Lv  1 Term             Past medical history,surgical history, problem list, medications, allergies, family history and social history were all reviewed and documented in the EPIC chart.   Directed ROS with pertinent positives and negatives documented in the history of present illness/assessment and plan.  Exam:  Vitals:   07/17/22 0937  BP: 114/80  Pulse: 78  SpO2: 96%   General appearance:  Normal  Written informed consent for EBx. Procedure: Speculum inserted in the vagina.  Betadine prep on the cervix.  HurriCaine spray on the cervix.  The anterior lip of the cervix is grasped with a tenaculum.  Os finder used.  Endometrial biopsy with negative pressure using a pick that on all intra uterine surfaces.  Pipette removed.  Moderate specimen sent to pathology.  Tenaculum removed.  Silver nitrate on the cervix to control hemostasis.  Speculum removed.  Procedure well-tolerated by patient.  No complication.   Assessment/Plan:  67 y.o. G1P1001   1. Postmenopausal bleeding The endometrial lining is thin and symmetrical measured at 2.56 mm with no mass or thickening or abnormal blood flow seen.   Written informed consent for EBx. Procedure: Speculum inserted in the vagina.  Betadine prep on the cervix.  HurriCaine spray on the cervix.  The anterior lip of the cervix is grasped with a tenaculum.  Os finder used.   Endometrial biopsy with negative pressure using a pick that on all intra uterine surfaces.  Pipette removed.  Moderate specimen sent to pathology.  Tenaculum removed.  Silver nitrate on the cervix to control hemostasis.  Speculum removed.  Procedure well-tolerated by patient.  No complication.  Postprocedure precautions.  Management per results. - Surgical pathology( [place]/ [facility])  Other orders - Potassium Chloride ER 20 MEQ TBCR; 1 tablet with food Orally Once a day for 30 days   [name] MD, 9:44 AM 07/17/2022

## 2022-07-18 LAB — SURGICAL PATHOLOGY

## 2022-07-21 ENCOUNTER — Telehealth: Payer: Self-pay

## 2022-07-21 NOTE — Telephone Encounter (Signed)
Patient asked what to do if she has anymore vaginal PMB?

## 2022-07-21 NOTE — Telephone Encounter (Signed)
-----   Message from Genia Del, MD sent at 07/18/2022  3:52 PM EDT ----- EBx Benign  FINAL MICROSCOPIC DIAGNOSIS:   A. ENDOMETRIUM, BIOPSY:  - Benign proliferative endometrium  - No hyperplasia or malignancy identified

## 2022-07-22 NOTE — Telephone Encounter (Signed)
Spoke with patient and advised her to call and report any vaginal bleeding.

## 2022-07-22 NOTE — Telephone Encounter (Signed)
Linda Del, MD  You8 minutes ago (2:45 PM)   Call us to be reevaluated. Dr Elbert Ewings

## 2022-08-13 ENCOUNTER — Ambulatory Visit
Admission: RE | Admit: 2022-08-13 | Discharge: 2022-08-13 | Disposition: A | Discharge status: Home / Self Care | Payer: Medicare PPO | Source: Ambulatory Visit | Attending: Family Medicine | Admitting: Family Medicine

## 2022-08-13 DIAGNOSIS — Z1231 Encounter for screening mammogram for malignant neoplasm of breast: Secondary | ICD-10-CM

## 2022-08-20 ENCOUNTER — Other Ambulatory Visit: Payer: Self-pay | Admitting: Internal Medicine

## 2022-08-22 DIAGNOSIS — Z6841 Body Mass Index (BMI) 40.0 and over, adult: Secondary | ICD-10-CM | POA: Diagnosis not present

## 2022-08-22 DIAGNOSIS — M1990 Unspecified osteoarthritis, unspecified site: Secondary | ICD-10-CM | POA: Diagnosis not present

## 2022-08-22 DIAGNOSIS — E876 Hypokalemia: Secondary | ICD-10-CM | POA: Diagnosis not present

## 2022-08-22 DIAGNOSIS — R1032 Left lower quadrant pain: Secondary | ICD-10-CM | POA: Diagnosis not present

## 2022-08-22 DIAGNOSIS — N939 Abnormal uterine and vaginal bleeding, unspecified: Secondary | ICD-10-CM | POA: Diagnosis not present

## 2022-08-22 DIAGNOSIS — I10 Essential (primary) hypertension: Secondary | ICD-10-CM | POA: Diagnosis not present

## 2022-10-07 DIAGNOSIS — H25813 Combined forms of age-related cataract, bilateral: Secondary | ICD-10-CM | POA: Diagnosis not present

## 2022-10-07 DIAGNOSIS — R7303 Prediabetes: Secondary | ICD-10-CM | POA: Diagnosis not present

## 2022-10-07 DIAGNOSIS — H5213 Myopia, bilateral: Secondary | ICD-10-CM | POA: Diagnosis not present

## 2022-10-07 DIAGNOSIS — H52201 Unspecified astigmatism, right eye: Secondary | ICD-10-CM | POA: Diagnosis not present

## 2022-10-07 DIAGNOSIS — H524 Presbyopia: Secondary | ICD-10-CM | POA: Diagnosis not present

## 2022-10-18 ENCOUNTER — Other Ambulatory Visit: Payer: Self-pay | Admitting: Internal Medicine

## 2022-10-28 DIAGNOSIS — Z0100 Encounter for examination of eyes and vision without abnormal findings: Secondary | ICD-10-CM | POA: Diagnosis not present

## 2022-11-24 DIAGNOSIS — M549 Dorsalgia, unspecified: Secondary | ICD-10-CM | POA: Diagnosis not present

## 2022-11-24 DIAGNOSIS — R7303 Prediabetes: Secondary | ICD-10-CM | POA: Diagnosis not present

## 2022-11-24 DIAGNOSIS — Z6841 Body Mass Index (BMI) 40.0 and over, adult: Secondary | ICD-10-CM | POA: Diagnosis not present

## 2022-12-12 ENCOUNTER — Other Ambulatory Visit: Payer: Self-pay | Admitting: Internal Medicine

## 2022-12-16 ENCOUNTER — Ambulatory Visit: Payer: Medicare PPO | Admitting: Radiology

## 2022-12-25 DIAGNOSIS — I10 Essential (primary) hypertension: Secondary | ICD-10-CM | POA: Diagnosis not present

## 2022-12-25 DIAGNOSIS — Z6841 Body Mass Index (BMI) 40.0 and over, adult: Secondary | ICD-10-CM | POA: Diagnosis not present

## 2022-12-25 DIAGNOSIS — M545 Low back pain, unspecified: Secondary | ICD-10-CM | POA: Diagnosis not present

## 2023-01-22 DIAGNOSIS — I10 Essential (primary) hypertension: Secondary | ICD-10-CM | POA: Diagnosis not present

## 2023-01-22 DIAGNOSIS — R739 Hyperglycemia, unspecified: Secondary | ICD-10-CM | POA: Diagnosis not present

## 2023-01-22 DIAGNOSIS — R5383 Other fatigue: Secondary | ICD-10-CM | POA: Diagnosis not present

## 2023-01-22 DIAGNOSIS — R7303 Prediabetes: Secondary | ICD-10-CM | POA: Diagnosis not present

## 2023-01-22 DIAGNOSIS — M1990 Unspecified osteoarthritis, unspecified site: Secondary | ICD-10-CM | POA: Diagnosis not present

## 2023-01-22 DIAGNOSIS — Z6841 Body Mass Index (BMI) 40.0 and over, adult: Secondary | ICD-10-CM | POA: Diagnosis not present

## 2023-02-11 ENCOUNTER — Other Ambulatory Visit: Payer: Self-pay | Admitting: Internal Medicine

## 2023-02-18 ENCOUNTER — Ambulatory Visit: Payer: Medicare HMO | Admitting: Radiology

## 2023-02-23 ENCOUNTER — Ambulatory Visit: Payer: Medicare HMO | Admitting: Radiology

## 2023-02-26 ENCOUNTER — Ambulatory Visit (INDEPENDENT_AMBULATORY_CARE_PROVIDER_SITE_OTHER): Payer: Medicare HMO | Admitting: Radiology

## 2023-02-26 VITALS — BP 122/82

## 2023-02-26 DIAGNOSIS — N9489 Other specified conditions associated with female genital organs and menstrual cycle: Secondary | ICD-10-CM

## 2023-02-26 DIAGNOSIS — N760 Acute vaginitis: Secondary | ICD-10-CM

## 2023-02-26 LAB — WET PREP FOR TRICH, YEAST, CLUE

## 2023-02-26 MED ORDER — METRONIDAZOLE 500 MG PO TABS
500.0000 mg | ORAL_TABLET | Freq: Two times a day (BID) | ORAL | 0 refills | Status: DC
Start: 2023-02-26 — End: 2023-02-27

## 2023-02-26 NOTE — Progress Notes (Signed)
      Subjective: Linda Cabrera is a 67 y.o. female who complains of vaginal burning x 2 weeks. Takes valtrex daily for suppression of HSV. Not sexually active.    Review of Systems  All other systems reviewed and are negative.   Past Medical History:  Diagnosis Date   Anemia, unspecified 07/13/2015   Arthritis    Asthma    Dyspnea    Hernia, umbilical    Herpes    Hypertension    Pre-diabetes       Objective:  Today's Vitals   02/26/23 1019  BP: 122/82   There is no height or weight on file to calculate BMI.   Physical Exam Vitals and nursing note reviewed. Exam conducted with a chaperone present.  Constitutional:      Appearance: Normal appearance. She is well-developed.  Pulmonary:     Effort: Pulmonary effort is normal.  Abdominal:     General: Abdomen is flat.     Palpations: Abdomen is soft.  Genitourinary:    General: Normal vulva.     Vagina: Vaginal discharge present. No erythema, bleeding or lesions.     Cervix: Normal. No discharge, friability, lesion or erythema.     Uterus: Normal.      Adnexa: Right adnexa normal and left adnexa normal.  Neurological:     Mental Status: She is alert.  Psychiatric:        Mood and Affect: Mood normal.        Thought Content: Thought content normal.        Judgment: Judgment normal.    Microscopic wet-mount exam shows clue cells.   Raynelle Fanning, CMA present for exam  Assessment:/Plan:  1. Vaginal burning - WET PREP FOR TRICH, YEAST, CLUE  2. BV (bacterial vaginosis) - metroNIDAZOLE (FLAGYL) 500 MG tablet; Take 1 tablet (500 mg total) by mouth 2 (two) times daily.  Dispense: 14 tablet; Refill: 0 YEAST, CLUE   Avoid the use of soaps or perfumed products in the peri area. Avoid tub baths and sitting in sweaty or wet clothing for prolonged periods of time.     Yetta Marceaux B, NP 10:27 AM

## 2023-02-27 ENCOUNTER — Other Ambulatory Visit: Payer: Self-pay | Admitting: Obstetrics and Gynecology

## 2023-02-27 ENCOUNTER — Telehealth: Payer: Self-pay

## 2023-02-27 DIAGNOSIS — N760 Acute vaginitis: Secondary | ICD-10-CM

## 2023-02-27 MED ORDER — METRONIDAZOLE 0.75 % VA GEL
1.0000 | Freq: Every day | VAGINAL | 0 refills | Status: DC
Start: 2023-02-27 — End: 2023-03-04

## 2023-02-27 NOTE — Telephone Encounter (Signed)
Per Dr. Kennith Center: "Okay to send metrogel 0.75%, insert 1 Applicatorful vaginally at bedtime for 5 days, dispense 50g, no refills."  Rx sent. Pt notified and voiced understanding. Routing to provider for final review and closing encounter.

## 2023-02-27 NOTE — Telephone Encounter (Signed)
Pt LVM in triage line stating that she was tx recently for BV w/ oral meds and she would like the cream/gel instead. Ok to send?

## 2023-03-02 ENCOUNTER — Other Ambulatory Visit: Payer: Self-pay | Admitting: Obstetrics and Gynecology

## 2023-03-02 DIAGNOSIS — B9689 Other specified bacterial agents as the cause of diseases classified elsewhere: Secondary | ICD-10-CM

## 2023-03-02 MED ORDER — CLINDAMYCIN PHOSPHATE 2 % VA CREA: 1.0000 | TOPICAL_CREAM | Freq: Every day | VAGINAL | 0 refills | Status: AC

## 2023-03-02 NOTE — Telephone Encounter (Signed)
Patient notified. Patient notified. Patient states she will compare cost at pharmacy and return call to provide update on which option she chooses.

## 2023-03-02 NOTE — Telephone Encounter (Signed)
Pt also LVM in triage line stating that pharmacy was trying to reach out about alternative medication..  Medication alternative requested since rx was sent in for 50g and the tubes only come in 70g.  Please advise if ok to dispense pt 70g or if desired to send alternative?  Thanks.

## 2023-03-02 NOTE — Telephone Encounter (Signed)
Call returned to patient. Patient is requesting alternative to Metrogel and Flagyl. Metrogel not covered by plan. Patient states she has hiatal hernia, unable to take PO Flagyl. Patient is unsure of OOP cost or alternatives. Advised patient I will contact pharmacy to determine if they can provide OOP cost or alternative. I will return call once reviewed. Patient verbalizes understanding.   Call placed to [facility] on file, left detailed message requesting return call with covered alternatives to Metrogel and/or OOP cost.   Dr. Kennith Center -Do you have another alternative?

## 2023-03-02 NOTE — Telephone Encounter (Signed)
Per GH:  "Ill send in clindamycin vaginal gel. Unsure about coverage."  I called pharmacy to make sure they received this rx and they advised me that they did and that the pharmacist was just verifying it then they will get it filled for pt.  Called pt to inform, no answer, LDVM on machine per DPR.

## 2023-03-09 ENCOUNTER — Ambulatory Visit (INDEPENDENT_AMBULATORY_CARE_PROVIDER_SITE_OTHER): Payer: Medicare HMO | Admitting: Physician Assistant

## 2023-03-09 ENCOUNTER — Encounter: Payer: Self-pay | Admitting: Physician Assistant

## 2023-03-09 ENCOUNTER — Other Ambulatory Visit (INDEPENDENT_AMBULATORY_CARE_PROVIDER_SITE_OTHER): Payer: Medicare HMO

## 2023-03-09 ENCOUNTER — Other Ambulatory Visit (INDEPENDENT_AMBULATORY_CARE_PROVIDER_SITE_OTHER): Payer: Self-pay

## 2023-03-09 DIAGNOSIS — M25551 Pain in right hip: Secondary | ICD-10-CM

## 2023-03-09 DIAGNOSIS — G8929 Other chronic pain: Secondary | ICD-10-CM

## 2023-03-09 DIAGNOSIS — M5441 Lumbago with sciatica, right side: Secondary | ICD-10-CM | POA: Diagnosis not present

## 2023-03-09 MED ORDER — CYCLOBENZAPRINE HCL 10 MG PO TABS: 10.0000 mg | ORAL_TABLET | Freq: Every day | ORAL | 1 refills | Status: DC

## 2023-03-09 MED ORDER — METHYLPREDNISOLONE 4 MG PO TABS: ORAL_TABLET | ORAL | 0 refills | Status: DC

## 2023-03-09 NOTE — Progress Notes (Signed)
Office Visit Note   Patient: Linda Cabrera           Date of Birth: Jul 29, 1955           MRN: [id number] Visit Date: 03/09/2023              Requested by: Irven Coe, MD [address] [place],  [place] [address] PCP: Irven Coe, MD   Assessment & Plan: Visit Diagnoses:  1. Chronic right-sided low back pain with right-sided sciatica   2. Pain in right hip     Plan:  We will send her to formal physical therapy for back to work on range of motion, strengthening, stretching and a home exercise program.  She is encouraged to continue to work on quad strengthening both legs.  Also work on weight loss.  She was given a prescription for resolved therapy.  She is placed on a Medrol Dosepak.  She is to take no NSAIDs while on the Medrol Dosepak.  She is given Flexeril to take mainly at night.  Follow-up with [place] in 6 weeks to see how she is doing overall.  Questions were encouraged and answered at length.  Condition becomes worse in any way she will return sooner.  Follow-Up Instructions: Return in about 6 weeks (around 04/20/2023).   Orders:  Orders Placed This Encounter  Procedures   XR Lumbar Spine 2-3 Views   XR HIP UNILAT W OR W/O PELVIS 2-3 VIEWS RIGHT   Meds ordered this encounter  Medications   methylPREDNISolone (MEDROL) 4 MG tablet    Sig: Take as directed    Dispense:  21 tablet    Refill:  0   cyclobenzaprine (FLEXERIL) 10 MG tablet    Sig: Take 1 tablet (10 mg total) by mouth at bedtime.    Dispense:  30 tablet    Refill:  1      Procedures: No procedures performed   Clinical Data: No additional findings.   Subjective: Chief Complaint  Patient presents with   Lower Back - Pain, Numbness   Right Hip - Pain    HPI Mrs. Jury is a 67 year old female were seen for right leg pain.  She states that 2 weeks ago her hip "popped out.  Since then she has had some groin pain and numbness tingling down her leg into her foot.  She denies any back pain.   She does states she knows she needs a knee replacement on the right due to arthritis.  She states that her pain down the leg awakens her.  No bowel or bladder dysfunction, no saddle anesthesia symptoms, fevers, or chills.  She has been taking Tylenol for pain without any real relief.  She is nondiabetic. Review of Systems   Objective: Vital Signs: LMP 09/11/2020 Comment: BTL, not sexually active  Physical Exam Constitutional:      Appearance: She is not ill-appearing or diaphoretic.  Cardiovascular:     Pulses: Normal pulses.  Pulmonary:     Effort: Pulmonary effort is normal.  Neurological:     Mental Status: She is alert and oriented to person, place, and time.  Psychiatric:        Mood and Affect: Mood normal.     Ortho Exam Bilateral feet she has subjective decrease sensation over the lateral aspect of the right foot.  Otherwise no rashes skin lesions ulcerations of either foot. Lower extremities: 5 out of 5 strength throughout the lower extremities against resistance.  Positive straight  leg raise on the right negative on the left.  She is near full flexion coming within a couple of inches of touching her toes.  Extension lumbar spine causes pain.  She has tenderness over the lower lumbar spinal column.  Bilateral hips good range of motion without pain.  Nontender over the trochanteric region bilaterally. Specialty Comments:  No specialty comments available.  Imaging: XR Lumbar Spine 2-3 Views  Result Date: 03/09/2023 Lumbar spine: 2 views: No acute fractures.  Disc base overall well-maintained.  Grade 1 anterior spondylolisthesis L4 on L5.  Lower lumbar multiple levels with anterior vertebral spurring.   XR HIP UNILAT W OR W/O PELVIS 2-3 VIEWS RIGHT  Result Date: 03/09/2023 AP pelvis lateral view right hip: Right hip overall well preserved.  No acute fractures bony abnormalities.  Overlying soft tissue somewhat obstructs the bony detail both hips.    PMFS History: Patient  Active Problem List   Diagnosis Date Noted   Abdominal pain, epigastric 05/02/2022   Dysphagia 05/02/2022   Constipation 05/02/2022   Leg swelling 07/12/2021   GERD (gastroesophageal reflux disease) 03/06/2021   Stiffness of hand joint 11/01/2018   Pre-diabetes 02/10/2018   Anemia, unspecified 07/13/2015   Routine general medical examination at a health care facility 06/15/2014   HSV-2 (herpes simplex virus 2) infection 06/19/2011   Severe obesity (BMI >= 40) (HCC) 06/11/2006   History of tobacco abuse 06/11/2006   Essential hypertension 06/11/2006   Asthma 06/11/2006   Osteoarthritis 06/11/2006   Past Medical History:  Diagnosis Date   Anemia, unspecified 07/13/2015   Arthritis    Asthma    Dyspnea    Hernia, umbilical    Herpes    Hypertension    Pre-diabetes     Family History  Problem Relation Age of Onset   Arthritis Mother    Hypertension Mother    Cancer Mother        pancreatic   Pancreatic cancer Mother    Arthritis Father    Hypertension Father    Colon cancer Neg Hx     Past Surgical History:  Procedure Laterality Date   APPENDECTOMY     BIOPSY  06/24/2022   Procedure: BIOPSY;  Surgeon: Beverley Fiedler, MD;  Location: [facility];  Service: Gastroenterology;;   Silverio Decamp PYLORI N/A 02/13/2014   Procedure: [place];  Surgeon: Atilano Ina, MD;  Location: [facility];  Service: General;  Laterality: N/A;   CHOLECYSTECTOMY N/A 04/29/2019   Procedure: LAPAROSCOPIC CHOLECYSTECTOMY;  Surgeon: Rodman Pickle, MD;  Location: [facility];  Service: General;  Laterality: N/A;   COLONOSCOPY N/A 08/15/2013   Procedure: COLONOSCOPY;  Surgeon: Beverley Fiedler, MD;  Location: [facility];  Service: Gastroenterology;  Laterality: N/A;   DILATATION & CURETTAGE/HYSTEROSCOPY WITH MYOSURE N/A 01/07/2021   Procedure: DILATATION & CURETTAGE/HYSTEROSCOPY WITH MYOSURE;  Surgeon: Genia Del, MD;  Location: [facility];  Service: Gynecology;  Laterality: N/A;    ESOPHAGEAL DILATION  06/24/2022   Procedure: ESOPHAGEAL DILATION;  Surgeon: Beverley Fiedler, MD;  Location: [facility];  Service: Gastroenterology;;   ESOPHAGOGASTRODUODENOSCOPY (EGD) WITH PROPOFOL N/A 06/24/2022   Procedure: ESOPHAGOGASTRODUODENOSCOPY (EGD) WITH PROPOFOL;  Surgeon: Beverley Fiedler, MD;  Location: [facility];  Service: Gastroenterology;  Laterality: N/A;   LAPAROSCOPIC APPENDECTOMY N/A 05/10/2022   Procedure: APPENDECTOMY LAPAROSCOPIC;  Surgeon: Violeta Gelinas, MD;  Location: [facility] OR;  Service: General;  Laterality: N/A;   TONSILLECTOMY     TUBAL LIGATION     Social  History   Occupational History   Not on file  Tobacco Use   Smoking status: Former    Current packs/day: 0.00    Types: Cigarettes    Quit date: 06/16/2004    Years since quitting: 18.7   Smokeless tobacco: Never  Vaping Use   Vaping status: Never Used  Substance and Sexual Activity   Alcohol use: Not Currently    Comment: once a month   Drug use: Not Currently    Comment: remote h/o crack cocaine   Sexual activity: Not Currently    Partners: Male    Birth control/protection: Surgical, Post-menopausal    Comment: BTL

## 2023-03-11 ENCOUNTER — Ambulatory Visit (INDEPENDENT_AMBULATORY_CARE_PROVIDER_SITE_OTHER): Payer: Medicare HMO | Admitting: Radiology

## 2023-03-11 ENCOUNTER — Encounter: Payer: Self-pay | Admitting: Radiology

## 2023-03-11 VITALS — BP 114/80 | Ht 62.0 in | Wt 287.0 lb

## 2023-03-11 DIAGNOSIS — Z9189 Other specified personal risk factors, not elsewhere classified: Secondary | ICD-10-CM

## 2023-03-11 DIAGNOSIS — Z8619 Personal history of other infectious and parasitic diseases: Secondary | ICD-10-CM

## 2023-03-11 DIAGNOSIS — Z01419 Encounter for gynecological examination (general) (routine) without abnormal findings: Secondary | ICD-10-CM

## 2023-03-11 DIAGNOSIS — R6889 Other general symptoms and signs: Secondary | ICD-10-CM | POA: Diagnosis not present

## 2023-03-11 DIAGNOSIS — N952 Postmenopausal atrophic vaginitis: Secondary | ICD-10-CM

## 2023-03-11 DIAGNOSIS — B009 Herpesviral infection, unspecified: Secondary | ICD-10-CM

## 2023-03-11 NOTE — Patient Instructions (Signed)
Preventive Care 40-66 Years Old, Female Preventive care refers to lifestyle choices and visits with your health care provider that can promote health and wellness. Preventive care visits are also called wellness exams. What can I expect for my preventive care visit? Counseling Your health care provider may ask you questions about your: Medical history, including: Past medical problems. Family medical history. Pregnancy history. Current health, including: Menstrual cycle. Method of birth control. Emotional well-being. Home life and relationship well-being. Sexual activity and sexual health. Lifestyle, including: Alcohol, nicotine or tobacco, and drug use. Access to firearms. Diet, exercise, and sleep habits. Work and work environment. Sunscreen use. Safety issues such as seatbelt and bike helmet use. Physical exam Your health care provider will check your: Height and weight. These may be used to calculate your BMI (body mass index). BMI is a measurement that tells if you are at a healthy weight. Waist circumference. This measures the distance around your waistline. This measurement also tells if you are at a healthy weight and may help predict your risk of certain diseases, such as type 2 diabetes and high blood pressure. Heart rate and blood pressure. Body temperature. Skin for abnormal spots. What immunizations do I need?  Vaccines are usually given at various ages, according to a schedule. Your health care provider will recommend vaccines for you based on your age, medical history, and lifestyle or other factors, such as travel or where you work. What tests do I need? Screening Your health care provider may recommend screening tests for certain conditions. This may include: Lipid and cholesterol levels. Diabetes screening. This is done by checking your blood sugar (glucose) after you have not eaten for a while (fasting). Pelvic exam and Pap test. Hepatitis B test. Hepatitis C  test. HIV (human immunodeficiency virus) test. STI (sexually transmitted infection) testing, if you are at risk. Lung cancer screening. Colorectal cancer screening. Mammogram. Talk with your health care provider about when you should start having regular mammograms. This may depend on whether you have a family history of breast cancer. BRCA-related cancer screening. This may be done if you have a family history of breast, ovarian, tubal, or peritoneal cancers. Bone density scan. This is done to screen for osteoporosis. Talk with your health care provider about your test results, treatment options, and if necessary, the need for more tests. Follow these instructions at home: Eating and drinking  Eat a diet that includes fresh fruits and vegetables, whole grains, lean protein, and low-fat dairy products. Take vitamin and mineral supplements as recommended by your health care provider. Do not drink alcohol if: Your health care provider tells you not to drink. You are pregnant, may be pregnant, or are planning to become pregnant. If you drink alcohol: Limit how much you have to 0-1 drink a day. Know how much alcohol is in your drink. In the [place] one drink equals one 12 oz bottle of beer (355 mL), one 5 oz glass of wine (148 mL), or one 1 oz glass of hard liquor (44 mL). Lifestyle Brush your teeth every morning and night with fluoride toothpaste. Floss one time each day. Exercise for at least 30 minutes 5 or more days each week. Do not use any products that contain nicotine or tobacco. These products include cigarettes, chewing tobacco, and vaping devices, such as e-cigarettes. If you need help quitting, ask your health care provider. Do not use drugs. If you are sexually active, practice safe sex. Use a condom or other form of protection to   prevent STIs. If you do not wish to become pregnant, use a form of birth control. If you plan to become pregnant, see your health care provider for a  prepregnancy visit. Take aspirin only as told by your health care provider. Make sure that you understand how much to take and what form to take. Work with your health care provider to find out whether it is safe and beneficial for you to take aspirin daily. Find healthy ways to manage stress, such as: Meditation, yoga, or listening to music. Journaling. Talking to a trusted person. Spending time with friends and family. Minimize exposure to UV radiation to reduce your risk of skin cancer. Safety Always wear your seat belt while driving or riding in a vehicle. Do not drive: If you have been drinking alcohol. Do not ride with someone who has been drinking. When you are tired or distracted. While texting. If you have been using any mind-altering substances or drugs. Wear a helmet and other protective equipment during sports activities. If you have firearms in your house, make sure you follow all gun safety procedures. Seek help if you have been physically or sexually abused. What's next? Visit your health care provider once a year for an annual wellness visit. Ask your health care provider how often you should have your eyes and teeth checked. Stay up to date on all vaccines. This information is not intended to replace advice given to you by your health care provider. Make sure you discuss any questions you have with your health care provider. Document Revised: 09/26/2020 Document Reviewed: 09/26/2020 Elsevier Patient Education  2024 Elsevier Inc.  

## 2023-03-11 NOTE — Progress Notes (Signed)
   Linda Cabrera February 23, 1956 [id number]   History: Postmenopausal 67 y.o. presents for annual exam.No new gyn concerns.   Risk Factors for Medicare Patients >/= 5 sexual partners in a lifetime: No First intercourse <39 years of age: No H/O STD at any age: Yes Abnormal pap smear, < 3 negative paps within the last 7 years: Yes DES exposure (women born between [phone]): No Patient is on post breast cancer medication like Femara or, if medication like this is not needed, 5 years post breast cancer diagnosis: No   Gynecologic History Postmenopausal Last Pap: 2023. Results were: normal Last mammogram: 5/24. Results were: normal Last colonoscopy: 3/24   Obstetric History OB History  Gravida Para Term Preterm AB Living  1 1 1     1   SAB IAB Ectopic Multiple Live Births               # Outcome Date GA Lbr Len/2nd Weight Sex Type Anes PTL Lv  1 Term              The following portions of the patient's history were reviewed and updated as appropriate: allergies, current medications, past family history, past medical history, past social history, past surgical history, and problem list.  Review of Systems Pertinent items noted in HPI and remainder of comprehensive ROS otherwise negative.  Past medical history, past surgical history, family history and social history were all reviewed and documented in the EPIC chart.  Exam:  Vitals:   03/11/23 0956  BP: 114/80  Weight: 287 lb (130.2 kg)  Height: 5\' 2"  (1.575 m)   Body mass index is 52.49 kg/m.  General appearance:  Normal Thyroid:  Symmetrical, normal in size, without palpable masses or nodularity. Respiratory  Auscultation:  Clear without wheezing or rhonchi Cardiovascular  Auscultation:  Regular rate, without rubs, murmurs or gallops  Edema/varicosities:  Not grossly evident Abdominal  Soft,nontender, without masses, guarding or rebound.  Liver/spleen:  No organomegaly noted  Hernia:  None appreciated  Skin  Inspection:   Grossly normal Breasts: Examined lying and sitting.   Right: Without masses, retractions, nipple discharge or axillary adenopathy.   Left: Without masses, retractions, nipple discharge or axillary adenopathy. Genitourinary   Inguinal/mons:  Normal without inguinal adenopathy  External genitalia:  Normal appearing vulva with no masses, tenderness, or lesions  BUS/Urethra/Skene's glands:  Normal  Vagina:  Normal appearing with normal color and discharge, no lesions. Atrophy: moderate   Cervix:  Normal appearing without discharge or lesions  Uterus:  Normal in size, shape and contour.  Midline and mobile, nontender  Adnexa/parametria:     Rt: Normal in size, without masses or tenderness.   Lt: Normal in size, without masses or tenderness.  Anus and perineum: Normal    Linda Cabrera, CMA present for exam  Assessment/Plan:   1. Encounter for breast and pelvic examination Pap 2026 Follow up 1 year for B&P    Linda Cabrera B WHNP-BC, 10:15 AM 03/11/2023

## 2023-03-17 DIAGNOSIS — I10 Essential (primary) hypertension: Secondary | ICD-10-CM | POA: Diagnosis not present

## 2023-03-17 DIAGNOSIS — R6889 Other general symptoms and signs: Secondary | ICD-10-CM | POA: Diagnosis not present

## 2023-03-17 DIAGNOSIS — Z6841 Body Mass Index (BMI) 40.0 and over, adult: Secondary | ICD-10-CM | POA: Diagnosis not present

## 2023-03-24 DIAGNOSIS — M545 Low back pain, unspecified: Secondary | ICD-10-CM | POA: Diagnosis not present

## 2023-03-24 DIAGNOSIS — M5417 Radiculopathy, lumbosacral region: Secondary | ICD-10-CM | POA: Diagnosis not present

## 2023-03-28 ENCOUNTER — Other Ambulatory Visit: Payer: Self-pay | Admitting: Physician Assistant

## 2023-03-30 ENCOUNTER — Other Ambulatory Visit: Payer: Self-pay | Admitting: Physician Assistant

## 2023-03-30 DIAGNOSIS — M545 Low back pain, unspecified: Secondary | ICD-10-CM | POA: Diagnosis not present

## 2023-03-30 DIAGNOSIS — M5417 Radiculopathy, lumbosacral region: Secondary | ICD-10-CM | POA: Diagnosis not present

## 2023-03-30 MED ORDER — METHYLPREDNISOLONE 4 MG PO TABS: ORAL_TABLET | ORAL | 0 refills | Status: DC

## 2023-04-16 DIAGNOSIS — M545 Low back pain, unspecified: Secondary | ICD-10-CM | POA: Diagnosis not present

## 2023-04-16 DIAGNOSIS — M5417 Radiculopathy, lumbosacral region: Secondary | ICD-10-CM | POA: Diagnosis not present

## 2023-04-17 ENCOUNTER — Other Ambulatory Visit: Payer: Self-pay | Admitting: Orthopaedic Surgery

## 2023-04-17 ENCOUNTER — Telehealth: Payer: Self-pay | Admitting: Physician Assistant

## 2023-04-17 MED ORDER — CELECOXIB 200 MG PO CAPS: 200.0000 mg | ORAL_CAPSULE | Freq: Two times a day (BID) | ORAL | 3 refills | Status: AC | PRN

## 2023-04-17 NOTE — Telephone Encounter (Signed)
 Patient states the Medrol dosepak 4 mg is to strong for her to take., Pt complains that when she wakes up she feels funny and dizziness.    Patient would like something different.

## 2023-04-20 ENCOUNTER — Ambulatory Visit: Payer: Self-pay | Admitting: Physician Assistant

## 2023-04-21 DIAGNOSIS — M5417 Radiculopathy, lumbosacral region: Secondary | ICD-10-CM | POA: Diagnosis not present

## 2023-04-21 DIAGNOSIS — M545 Low back pain, unspecified: Secondary | ICD-10-CM | POA: Diagnosis not present

## 2023-04-27 ENCOUNTER — Encounter: Payer: Self-pay | Admitting: Physician Assistant

## 2023-04-27 ENCOUNTER — Ambulatory Visit (INDEPENDENT_AMBULATORY_CARE_PROVIDER_SITE_OTHER): Payer: 59 | Admitting: Physician Assistant

## 2023-04-27 DIAGNOSIS — M5441 Lumbago with sciatica, right side: Secondary | ICD-10-CM | POA: Diagnosis not present

## 2023-04-27 DIAGNOSIS — G8929 Other chronic pain: Secondary | ICD-10-CM

## 2023-04-27 NOTE — Progress Notes (Signed)
 HPI: Mrs. Goldberger returns today for follow-up of her hip pain on the right and back pain.  She does feel therapy has been somewhat beneficial.  Her main complaint at this point time is continued back pain and radicular symptoms down the right leg.  She states she has numbness down into her right foot.  Pain in her back does awaken her.  She denies any saddle anesthesia symptoms or bowel or bladder dysfunction.  Denies any fevers or chills.  She did feel she got some relief with the Medrol  Dosepak it helped with her back pain but did not help with the numbness tingling down the leg.  She also feels therapy was beneficial for her hip and somewhat for her back.  She ranks her back pain to be 8 out of 10 pain at worst.  Pain in her back is worse with sitting and laying down.  Review of systems: See HPI otherwise negative  Physical exam: General Well-developed well-nourished female is able to get on and off the exam table on her own. Respirations: Unlabored Lumbar spine tenderness over the lumbar spinal column in the right paraspinous region.  5 out of 5 strength throughout lower extremities against resistance negative straight leg raise. Bilateral hips: Good range of motion without pain.  Impression: Right lumbar radiculopathy  Plan: Given patient's failure of conservative treatment which is included medications and physical therapy recommend MRI of the lumbar spine to evaluate for the source of her right radiculopathy.  I have her follow-up after the MRI to go over results discuss further treatment.  Questions were encouraged and answered at length.

## 2023-04-28 ENCOUNTER — Other Ambulatory Visit: Payer: Self-pay | Admitting: Radiology

## 2023-04-28 DIAGNOSIS — G8929 Other chronic pain: Secondary | ICD-10-CM

## 2023-05-25 ENCOUNTER — Ambulatory Visit
Admission: RE | Admit: 2023-05-25 | Discharge: 2023-05-25 | Disposition: A | Discharge status: Home / Self Care | Payer: 59 | Source: Ambulatory Visit | Attending: Physician Assistant | Admitting: Physician Assistant

## 2023-05-25 DIAGNOSIS — M48061 Spinal stenosis, lumbar region without neurogenic claudication: Secondary | ICD-10-CM | POA: Diagnosis not present

## 2023-05-25 DIAGNOSIS — G8929 Other chronic pain: Secondary | ICD-10-CM

## 2023-06-04 ENCOUNTER — Ambulatory Visit (INDEPENDENT_AMBULATORY_CARE_PROVIDER_SITE_OTHER): Payer: 59 | Admitting: Physician Assistant

## 2023-06-04 ENCOUNTER — Encounter: Payer: Self-pay | Admitting: Physician Assistant

## 2023-06-04 DIAGNOSIS — G8929 Other chronic pain: Secondary | ICD-10-CM | POA: Diagnosis not present

## 2023-06-04 DIAGNOSIS — M5441 Lumbago with sciatica, right side: Secondary | ICD-10-CM | POA: Diagnosis not present

## 2023-06-04 NOTE — Progress Notes (Signed)
 HPI: Linda Cabrera returns today follow-up of her low back pain.  She states the pain really has not changed.  She is not having low back pain without any real radicular symptoms.  Pains primarily on the right center of the back.  Worse with sitting or lying down.  She has tried therapy, oral steroids and NSAIDs without any real relief.  Review of systems see HPI.  Physical exam: General Well-developed well-nourished female no acute distress.  Ambulates any assistive device. Lumbar spine: She comes within 4 inches of touching her toes.  Good extension of the lumbar spine with pain.  Tenderness over the lower lumbar spinal column and right paraspinous regions.  MRI spine dated 06/02/2023 is reviewed with the patient.  This shows advanced facet arthritic changes at L4-5 and L5-S1.  Moderate facet changes at L3-4.  No significant spinal or foraminal stenosis throughout.  Just early spinal stenosis and L4-5.   Impression: Low back pain without radicular symptoms  Plan: Will send her for he has send injections/facet injections with Dr. Alvester Morin.  Have her follow-up with [place] 2 weeks after the injection to see what type of response she had.  Questions were encouraged and answered at length.  MRI images were reviewed with the patient.

## 2023-06-16 DIAGNOSIS — R7303 Prediabetes: Secondary | ICD-10-CM | POA: Diagnosis not present

## 2023-06-16 DIAGNOSIS — I10 Essential (primary) hypertension: Secondary | ICD-10-CM | POA: Diagnosis not present

## 2023-06-18 ENCOUNTER — Other Ambulatory Visit: Payer: Self-pay | Admitting: Family Medicine

## 2023-06-18 ENCOUNTER — Other Ambulatory Visit: Payer: Self-pay | Admitting: Radiology

## 2023-06-18 ENCOUNTER — Telehealth: Payer: Self-pay

## 2023-06-18 DIAGNOSIS — E2839 Other primary ovarian failure: Secondary | ICD-10-CM

## 2023-06-18 DIAGNOSIS — M1990 Unspecified osteoarthritis, unspecified site: Secondary | ICD-10-CM | POA: Diagnosis not present

## 2023-06-18 DIAGNOSIS — R143 Flatulence: Secondary | ICD-10-CM | POA: Diagnosis not present

## 2023-06-18 DIAGNOSIS — J45909 Unspecified asthma, uncomplicated: Secondary | ICD-10-CM | POA: Diagnosis not present

## 2023-06-18 DIAGNOSIS — G8929 Other chronic pain: Secondary | ICD-10-CM

## 2023-06-18 DIAGNOSIS — R7303 Prediabetes: Secondary | ICD-10-CM | POA: Diagnosis not present

## 2023-06-18 DIAGNOSIS — Z1211 Encounter for screening for malignant neoplasm of colon: Secondary | ICD-10-CM | POA: Diagnosis not present

## 2023-06-18 DIAGNOSIS — K219 Gastro-esophageal reflux disease without esophagitis: Secondary | ICD-10-CM | POA: Diagnosis not present

## 2023-06-18 DIAGNOSIS — Z23 Encounter for immunization: Secondary | ICD-10-CM | POA: Diagnosis not present

## 2023-06-18 DIAGNOSIS — I10 Essential (primary) hypertension: Secondary | ICD-10-CM | POA: Diagnosis not present

## 2023-06-18 DIAGNOSIS — M545 Low back pain, unspecified: Secondary | ICD-10-CM | POA: Diagnosis not present

## 2023-06-18 DIAGNOSIS — Z13820 Encounter for screening for osteoporosis: Secondary | ICD-10-CM | POA: Diagnosis not present

## 2023-06-18 DIAGNOSIS — Z0000 Encounter for general adult medical examination without abnormal findings: Secondary | ICD-10-CM | POA: Diagnosis not present

## 2023-06-18 NOTE — Telephone Encounter (Signed)
 This patient called regarding an injection referral. I do not have a referral from [place] but I see in the last note that he wanted to refer her for an injection. Can you please put in the referral so we can get her prior auth and scheduled. Thanks!!!

## 2023-06-30 ENCOUNTER — Encounter: Payer: Self-pay | Admitting: Physical Medicine and Rehabilitation

## 2023-06-30 ENCOUNTER — Ambulatory Visit (INDEPENDENT_AMBULATORY_CARE_PROVIDER_SITE_OTHER): Admitting: Physical Medicine and Rehabilitation

## 2023-06-30 DIAGNOSIS — M545 Low back pain, unspecified: Secondary | ICD-10-CM | POA: Diagnosis not present

## 2023-06-30 DIAGNOSIS — M47819 Spondylosis without myelopathy or radiculopathy, site unspecified: Secondary | ICD-10-CM | POA: Diagnosis not present

## 2023-06-30 DIAGNOSIS — M47816 Spondylosis without myelopathy or radiculopathy, lumbar region: Secondary | ICD-10-CM

## 2023-06-30 DIAGNOSIS — G8929 Other chronic pain: Secondary | ICD-10-CM

## 2023-06-30 MED ORDER — DIAZEPAM 5 MG PO TABS: ORAL_TABLET | ORAL | 0 refills | Status: DC

## 2023-06-30 NOTE — Progress Notes (Signed)
 Linda Cabrera - 68 y.o. female MRN [id number]  Date of birth: 16-Jun-1955  Office Visit Note: Visit Date: 06/30/2023 PCP: Irven Coe, MD Referred by: Irven Coe, MD  Subjective: Chief Complaint  Patient presents with   Lower Back - Pain   HPI: Linda Cabrera is a 68 y.o. female who comes in today per the request of [place], [place] for evaluation of chronic, worsening and severe bilateral lower back pain for several months. Her pain worsens with prolonged sitting, laying down and with activity. She reports difficulty bending and completing house hold tasks. She describes pain as stiff and heavy sensation, currently rates as 10 out of 10. Some relief of pain with home exercise regimen, rest and use of medications. History of formal physical therapy with no relief of pain. Recent lumbar MRI imaging shows advanced facet disease at L4-L5 and L5-S1. There is also mild multifactorial spinal and bilateral lateral recess stenosis, right greater than left at L4-L5. Patient denies focal weakness, numbness and tingling. No recent trauma or falls.       Review of Systems  Musculoskeletal:  Positive for back pain and myalgias.  Neurological:  Negative for tingling, sensory change, focal weakness and weakness.  All other systems reviewed and are negative.  Otherwise per HPI.  Assessment & Plan: Visit Diagnoses:    ICD-10-CM   1. Chronic bilateral low back pain without sciatica  M54.50 Ambulatory referral to Physical Medicine Rehab   G89.29     2. Spondylosis without myelopathy or radiculopathy  M47.819 Ambulatory referral to Physical Medicine Rehab    3. Facet arthropathy, lumbar  M47.816 Ambulatory referral to Physical Medicine Rehab       Plan: Findings:  Chronic, worsening and severe bilateral lower back pain. No radicular pain down the legs. Patient continues to have severe pain despite good conservative therapies such as home exercise regimen, rest and use of medications. Patients  clinical presentation and exam are consistent with facet mediated pain. There is advanced facet arthropathy at the levels of L4-L5 and L5-S1.  She does have pain with lumbar extension on exam today.  We discussed treatment plan in detail today. Next step is to perform diagnostic bilateral L4-L5 and L5-S1 facet/medial branch blocks under fluoroscopic guidance.  I discussed double diagnostic block paradigm with patient in detail. If good relief of pain with diagnostic medial branch blocks we discussed possibility of longer sustained pain relief with radiofrequency ablation.  Dr. Alvester Morin at bedside as well to discuss ablation procedure, patient has no questions at this time.  She did voice issue with anxiety related to injections, I prescribed pre-procedure Valium for her to take on day of injection.  No red flag symptoms noted upon exam today.    Meds & Orders:  Meds ordered this encounter  Medications   diazepam (VALIUM) 5 MG tablet    Sig: Take one tablet by mouth with food one hour prior to procedure. May repeat 30 minutes prior if needed.    Dispense:  2 tablet    Refill:  0    Orders Placed This Encounter  Procedures   Ambulatory referral to Physical Medicine Rehab    Follow-up: Return for Bilateral L4-L5 and L5-S1 facet/medial branch blocks.   Procedures: No procedures performed      Clinical History: Narrative & Impression CLINICAL DATA:  Chronic back and right leg pain.   EXAM: MRI LUMBAR SPINE WITHOUT CONTRAST   TECHNIQUE: Multiplanar, multisequence MR imaging of the lumbar spine was  performed. No intravenous contrast was administered.   COMPARISON:  Lumbar spine radiographs 03/09/2023   FINDINGS: Segmentation: There are five lumbar type vertebral bodies. The last full intervertebral disc space is labeled L5-S1. This correlates with the lumbar radiographs.   Alignment:  Normal   Vertebrae:  Normal marrow signal.  No bone lesions or fractures.   Conus medullaris and  cauda equina: Conus extends to the L1 level. Conus and cauda equina appear normal.   Paraspinal and other soft tissues: No significant paraspinal or retroperitoneal findings.   Disc levels:   T12-L1: No significant findings.   L1-2: Mild facet disease but no disc protrusions, spinal or foraminal stenosis.   L2-3: Mild facet disease but no disc protrusions, spinal or foraminal stenosis.   L3-4: Moderate facet disease and mild ligamentum flavum thickening but no disc protrusions, spinal or foraminal stenosis.   L4-5: Advanced facet disease and ligamentum flavum thickening in conjunction with a slight bulging disc contributing to mild bilateral lateral recess stenosis, right greater than left. Early spinal stenosis. No foraminal stenosis.   L5-S1: Advanced facet disease but no disc protrusions, spinal or foraminal stenosis.   IMPRESSION: 1. Mild multifactorial spinal and bilateral lateral recess stenosis, right greater than left at L4-5. No foraminal stenosis. 2. Advanced facet disease at L4-5 and L5-S1.     Electronically Signed   By: Rudie Meyer M.D.   On: 06/02/2023 17:18   She reports that she quit smoking about 19 years ago. Her smoking use included cigarettes. She has never been exposed to tobacco smoke. She has never used smokeless tobacco. No results for input(s): "HGBA1C", "LABURIC" in the last 8760 hours.  Objective:  VS:  HT:    WT:   BMI:     BP:   HR: bpm  TEMP: ( )  RESP:  Physical Exam Vitals and nursing note reviewed.  HENT:     Head: Normocephalic and atraumatic.     Right Ear: External ear normal.     Left Ear: External ear normal.     Nose: Nose normal.     Mouth/Throat:     Mouth: Mucous membranes are moist.  Eyes:     Extraocular Movements: Extraocular movements intact.  Cardiovascular:     Rate and Rhythm: Normal rate.     Pulses: Normal pulses.  Pulmonary:     Effort: Pulmonary effort is normal.  Abdominal:     General: Abdomen is  flat. There is no distension.  Musculoskeletal:        General: Tenderness present.     Cervical back: Normal range of motion.     Comments: Patient rises from seated position to standing without difficulty. Pain noted with facet loading and lumbar extension. 5/5 strength noted with bilateral hip flexion, knee flexion/extension, ankle dorsiflexion/plantarflexion and EHL. No clonus noted bilaterally. No pain upon palpation of greater trochanters. No pain with internal/external rotation of bilateral hips. Sensation intact bilaterally. Myofascial tenderness noted to bilateral lumbar paraspinal regions upon palpation. Negative slump test bilaterally. Ambulates without aid, gait steady.     Skin:    General: Skin is warm and dry.     Capillary Refill: Capillary refill takes less than 2 seconds.  Neurological:     General: No focal deficit present.     Mental Status: She is alert and oriented to person, place, and time.  Psychiatric:        Mood and Affect: Mood normal.        Behavior: Behavior  normal.     Ortho Exam  Imaging: No results found.  Past Medical/Family/Surgical/Social History: Medications & Allergies reviewed per EMR, new medications updated. Patient Active Problem List   Diagnosis Date Noted   Abdominal pain, epigastric 05/02/2022   Dysphagia 05/02/2022   Constipation 05/02/2022   Leg swelling 07/12/2021   GERD (gastroesophageal reflux disease) 03/06/2021   Stiffness of hand joint 11/01/2018   Pre-diabetes 02/10/2018   Anemia, unspecified 07/13/2015   Routine general medical examination at a health care facility 06/15/2014   HSV-2 (herpes simplex virus 2) infection 06/19/2011   Severe obesity (BMI >= 40) (HCC) 06/11/2006   History of tobacco abuse 06/11/2006   Essential hypertension 06/11/2006   Asthma 06/11/2006   Osteoarthritis 06/11/2006   Past Medical History:  Diagnosis Date   Anemia, unspecified 07/13/2015   Arthritis    Asthma    Dyspnea    Hernia,  umbilical    Herpes    Hypertension    Pre-diabetes    Family History  Problem Relation Age of Onset   Arthritis Mother    Hypertension Mother    Cancer Mother        pancreatic   Pancreatic cancer Mother    Arthritis Father    Hypertension Father    Colon cancer Neg Hx    Past Surgical History:  Procedure Laterality Date   APPENDECTOMY     BIOPSY  06/24/2022   Procedure: BIOPSY;  Surgeon: Beverley Fiedler, MD;  Location: [facility];  Service: Gastroenterology;;   Silverio Decamp PYLORI N/A 02/13/2014   Procedure: [place];  Surgeon: Atilano Ina, MD;  Location: [facility];  Service: General;  Laterality: N/A;   CHOLECYSTECTOMY N/A 04/29/2019   Procedure: LAPAROSCOPIC CHOLECYSTECTOMY;  Surgeon: Rodman Pickle, MD;  Location: [facility];  Service: General;  Laterality: N/A;   COLONOSCOPY N/A 08/15/2013   Procedure: COLONOSCOPY;  Surgeon: Beverley Fiedler, MD;  Location: [facility];  Service: Gastroenterology;  Laterality: N/A;   DILATATION & CURETTAGE/HYSTEROSCOPY WITH MYOSURE N/A 01/07/2021   Procedure: DILATATION & CURETTAGE/HYSTEROSCOPY WITH MYOSURE;  Surgeon: Genia Del, MD;  Location: [facility];  Service: Gynecology;  Laterality: N/A;   ESOPHAGEAL DILATION  06/24/2022   Procedure: ESOPHAGEAL DILATION;  Surgeon: Beverley Fiedler, MD;  Location: [facility];  Service: Gastroenterology;;   ESOPHAGOGASTRODUODENOSCOPY (EGD) WITH PROPOFOL N/A 06/24/2022   Procedure: ESOPHAGOGASTRODUODENOSCOPY (EGD) WITH PROPOFOL;  Surgeon: Beverley Fiedler, MD;  Location: [facility];  Service: Gastroenterology;  Laterality: N/A;   LAPAROSCOPIC APPENDECTOMY N/A 05/10/2022   Procedure: APPENDECTOMY LAPAROSCOPIC;  Surgeon: Violeta Gelinas, MD;  Location: [facility];  Service: General;  Laterality: N/A;   TONSILLECTOMY     TUBAL LIGATION     Social History   Occupational History   Not on file  Tobacco Use   Smoking status: Former    Current packs/day: 0.00    Types: Cigarettes    Quit  date: 06/16/2004    Years since quitting: 19.0    Passive exposure: Never   Smokeless tobacco: Never  Vaping Use   Vaping status: Never Used  Substance and Sexual Activity   Alcohol use: Not Currently    Comment: once a month   Drug use: Not Currently    Comment: remote h/o crack cocaine   Sexual activity: Not Currently    Partners: Male    Birth control/protection: Surgical, Post-menopausal    Comment: BTL, menarche 68yo, sexual debut over 89yo

## 2023-06-30 NOTE — Progress Notes (Signed)
 Pain Scale   Average Pain 9

## 2023-07-13 ENCOUNTER — Ambulatory Visit (INDEPENDENT_AMBULATORY_CARE_PROVIDER_SITE_OTHER): Admitting: Physical Medicine and Rehabilitation

## 2023-07-13 ENCOUNTER — Other Ambulatory Visit: Payer: Self-pay

## 2023-07-13 VITALS — BP 133/90 | HR 84

## 2023-07-13 DIAGNOSIS — M47816 Spondylosis without myelopathy or radiculopathy, lumbar region: Secondary | ICD-10-CM | POA: Diagnosis not present

## 2023-07-13 DIAGNOSIS — M47819 Spondylosis without myelopathy or radiculopathy, site unspecified: Secondary | ICD-10-CM

## 2023-07-13 MED ORDER — BUPIVACAINE HCL 0.5 % IJ SOLN
3.0000 mL | Freq: Once | INTRAMUSCULAR | Status: AC
  Administered 2023-07-13: 3 mL

## 2023-07-13 NOTE — Procedures (Signed)
 Lumbar Diagnostic Facet Joint Nerve Block with Fluoroscopic Guidance   Patient: Linda Cabrera      Date of Birth: [birth year] MRN: [id number] PCP: Irven Coe, MD      Visit Date: 07/13/2023   Universal Protocol:    Date/Time: 03/31/259:33 AM  Consent Given By: the patient  Position: PRONE  Additional Comments: Vital signs were monitored before and after the procedure. Patient was prepped and draped in the usual sterile fashion. The correct patient, procedure, and site was verified.   Injection Procedure Details:   Procedure diagnoses:  1. Spondylosis without myelopathy or radiculopathy      Meds Administered:  Meds ordered this encounter  Medications   bupivacaine (MARCAINE) 0.5 % (with pres) injection 3 mL     Laterality: Bilateral  Location/Site: L4-L5, L3 and L4 medial branches and L5-S1, L4 medial branch and L5 dorsal ramus  Needle: 5.0 in., 25 ga.  Short bevel or Quincke spinal needle  Needle Placement: Oblique pedical  Findings:   -Comments: There was excellent flow of contrast along the articular pillars without intravascular flow.  Procedure Details: The fluoroscope beam is vertically oriented in AP and then obliqued 15 to 20 degrees to the ipsilateral side of the desired nerve to achieve the "Scotty dog" appearance.  The skin over the target area of the junction of the superior articulating process and the transverse process (sacral ala if blocking the L5 dorsal rami) was locally anesthetized with a 1 ml volume of 1% Lidocaine without Epinephrine.  The spinal needle was inserted and advanced in a trajectory view down to the target.   After contact with periosteum and negative aspirate for blood and CSF, correct placement without intravascular or epidural spread was confirmed by injecting 0.5 ml. of Isovue-250.  A spot radiograph was obtained of this image.    Next, a 0.5 ml. volume of the injectate described above was injected. The needle was then redirected  to the other facet joint nerves mentioned above if needed.  Prior to the procedure, the patient was given a Pain Diary which was completed for baseline measurements.  After the procedure, the patient rated their pain every 30 minutes and will continue rating at this frequency for a total of 5 hours.  The patient has been asked to complete the Diary and return to [place] by mail, fax or hand delivered as soon as possible.   Additional Comments:  The patient tolerated the procedure well Dressing: 2 x 2 sterile gauze and Band-Aid    Post-procedure details: Patient was observed during the procedure. Post-procedure instructions were reviewed.  Patient left the clinic in stable condition.

## 2023-07-13 NOTE — Progress Notes (Signed)
 Linda Cabrera - 68 y.o. female MRN [id number]  Date of birth: [birth year]  Office Visit Note: Visit Date: 07/13/2023 PCP: Irven Coe, MD Referred by: Irven Coe, MD  Subjective: Chief Complaint  Patient presents with   Lower Back - Pain   HPI:  Linda Cabrera is a 68 y.o. female who comes in today at the request of Ellin Goodie, FNP for planned Bilateral  L4-5 and L5-S1 Lumbar facet/medial branch block with fluoroscopic guidance.  The patient has failed conservative care including home exercise, medications, time and activity modification.  This injection will be diagnostic and hopefully therapeutic.  Please see requesting physician notes for further details and justification.  Exam has shown concordant pain with facet joint loading.   ROS Otherwise per HPI.  Assessment & Plan: Visit Diagnoses:    ICD-10-CM   1. Spondylosis without myelopathy or radiculopathy  M47.819 XR C-ARM NO REPORT    Nerve Block    bupivacaine (MARCAINE) 0.5 % (with pres) injection 3 mL      Plan: No additional findings.   Meds & Orders:  Meds ordered this encounter  Medications   bupivacaine (MARCAINE) 0.5 % (with pres) injection 3 mL    Orders Placed This Encounter  Procedures   Nerve Block   XR C-ARM NO REPORT    Follow-up: Return for Review Pain Diary.   Procedures: No procedures performed  Lumbar Diagnostic Facet Joint Nerve Block with Fluoroscopic Guidance   Patient: Linda Cabrera      Date of Birth: 1955/12/20 MRN: [id number] PCP: Irven Coe, MD      Visit Date: 07/13/2023   Universal Protocol:    Date/Time: 03/31/259:33 AM  Consent Given By: the patient  Position: PRONE  Additional Comments: Vital signs were monitored before and after the procedure. Patient was prepped and draped in the usual sterile fashion. The correct patient, procedure, and site was verified.   Injection Procedure Details:   Procedure diagnoses:  1. Spondylosis without myelopathy or radiculopathy       Meds Administered:  Meds ordered this encounter  Medications   bupivacaine (MARCAINE) 0.5 % (with pres) injection 3 mL     Laterality: Bilateral  Location/Site: L4-L5, L3 and L4 medial branches and L5-S1, L4 medial branch and L5 dorsal ramus  Needle: 5.0 in., 25 ga.  Short bevel or Quincke spinal needle  Needle Placement: Oblique pedical  Findings:   -Comments: There was excellent flow of contrast along the articular pillars without intravascular flow.  Procedure Details: The fluoroscope beam is vertically oriented in AP and then obliqued 15 to 20 degrees to the ipsilateral side of the desired nerve to achieve the "Scotty dog" appearance.  The skin over the target area of the junction of the superior articulating process and the transverse process (sacral ala if blocking the L5 dorsal rami) was locally anesthetized with a 1 ml volume of 1% Lidocaine without Epinephrine.  The spinal needle was inserted and advanced in a trajectory view down to the target.   After contact with periosteum and negative aspirate for blood and CSF, correct placement without intravascular or epidural spread was confirmed by injecting 0.5 ml. of Isovue-250.  A spot radiograph was obtained of this image.    Next, a 0.5 ml. volume of the injectate described above was injected. The needle was then redirected to the other facet joint nerves mentioned above if needed.  Prior to the procedure, the patient was given a Pain Diary which was completed for  baseline measurements.  After the procedure, the patient rated their pain every 30 minutes and will continue rating at this frequency for a total of 5 hours.  The patient has been asked to complete the Diary and return to [place] by mail, fax or hand delivered as soon as possible.   Additional Comments:  The patient tolerated the procedure well Dressing: 2 x 2 sterile gauze and Band-Aid    Post-procedure details: Patient was observed during the  procedure. Post-procedure instructions were reviewed.  Patient left the clinic in stable condition.   Clinical History: Narrative & Impression CLINICAL DATA:  Chronic back and right leg pain.   EXAM: MRI LUMBAR SPINE WITHOUT CONTRAST   TECHNIQUE: Multiplanar, multisequence MR imaging of the lumbar spine was performed. No intravenous contrast was administered.   COMPARISON:  Lumbar spine radiographs 03/09/2023   FINDINGS: Segmentation: There are five lumbar type vertebral bodies. The last full intervertebral disc space is labeled L5-S1. This correlates with the lumbar radiographs.   Alignment:  Normal   Vertebrae:  Normal marrow signal.  No bone lesions or fractures.   Conus medullaris and cauda equina: Conus extends to the L1 level. Conus and cauda equina appear normal.   Paraspinal and other soft tissues: No significant paraspinal or retroperitoneal findings.   Disc levels:   T12-L1: No significant findings.   L1-2: Mild facet disease but no disc protrusions, spinal or foraminal stenosis.   L2-3: Mild facet disease but no disc protrusions, spinal or foraminal stenosis.   L3-4: Moderate facet disease and mild ligamentum flavum thickening but no disc protrusions, spinal or foraminal stenosis.   L4-5: Advanced facet disease and ligamentum flavum thickening in conjunction with a slight bulging disc contributing to mild bilateral lateral recess stenosis, right greater than left. Early spinal stenosis. No foraminal stenosis.   L5-S1: Advanced facet disease but no disc protrusions, spinal or foraminal stenosis.   IMPRESSION: 1. Mild multifactorial spinal and bilateral lateral recess stenosis, right greater than left at L4-5. No foraminal stenosis. 2. Advanced facet disease at L4-5 and L5-S1.     Electronically Signed   By: Rudie Meyer M.D.   On: 06/02/2023 17:18     Objective:  VS:  HT:    WT:   BMI:     BP:(!) 133/90  HR:84bpm  TEMP: ( )  RESP:   Physical Exam Vitals and nursing note reviewed.  Constitutional:      General: She is not in acute distress.    Appearance: Normal appearance. She is obese. She is not ill-appearing.  HENT:     Head: Normocephalic and atraumatic.     Right Ear: External ear normal.     Left Ear: External ear normal.  Eyes:     Extraocular Movements: Extraocular movements intact.  Cardiovascular:     Rate and Rhythm: Normal rate.     Pulses: Normal pulses.  Pulmonary:     Effort: Pulmonary effort is normal. No respiratory distress.  Abdominal:     General: There is no distension.     Palpations: Abdomen is soft.  Musculoskeletal:        General: Tenderness present.     Cervical back: Neck supple.     Right lower leg: No edema.     Left lower leg: No edema.     Comments: Patient has good distal strength with no pain over the greater trochanters.  No clonus or focal weakness.  Skin:    Findings: No erythema, lesion or rash.  Neurological:     General: No focal deficit present.     Mental Status: She is alert and oriented to person, place, and time.     Sensory: No sensory deficit.     Motor: No weakness or abnormal muscle tone.     Coordination: Coordination normal.  Psychiatric:        Mood and Affect: Mood normal.        Behavior: Behavior normal.      Imaging: No results found.

## 2023-07-13 NOTE — Progress Notes (Signed)
 Pain Scale   Average Pain 8        +Driver, -BT, -Dye Allergies.

## 2023-07-13 NOTE — Patient Instructions (Signed)

## 2023-07-14 ENCOUNTER — Other Ambulatory Visit: Payer: Self-pay | Admitting: Family Medicine

## 2023-07-14 ENCOUNTER — Encounter: Payer: Self-pay | Admitting: Internal Medicine

## 2023-07-14 DIAGNOSIS — Z1231 Encounter for screening mammogram for malignant neoplasm of breast: Secondary | ICD-10-CM

## 2023-07-19 ENCOUNTER — Emergency Department (HOSPITAL_BASED_OUTPATIENT_CLINIC_OR_DEPARTMENT_OTHER)
Admission: EM | Admit: 2023-07-19 | Discharge: 2023-07-19 | Disposition: A | Discharge status: Home / Self Care | Attending: Emergency Medicine | Admitting: Emergency Medicine

## 2023-07-19 DIAGNOSIS — Z79899 Other long term (current) drug therapy: Secondary | ICD-10-CM | POA: Diagnosis not present

## 2023-07-19 DIAGNOSIS — M542 Cervicalgia: Secondary | ICD-10-CM | POA: Insufficient documentation

## 2023-07-19 DIAGNOSIS — I10 Essential (primary) hypertension: Secondary | ICD-10-CM | POA: Insufficient documentation

## 2023-07-19 DIAGNOSIS — Z7982 Long term (current) use of aspirin: Secondary | ICD-10-CM | POA: Insufficient documentation

## 2023-07-19 DIAGNOSIS — J45909 Unspecified asthma, uncomplicated: Secondary | ICD-10-CM | POA: Diagnosis not present

## 2023-07-19 MED ORDER — ACETAMINOPHEN 500 MG PO TABS
1000.0000 mg | ORAL_TABLET | Freq: Once | ORAL | Status: AC
  Administered 2023-07-19: 1000 mg via ORAL
  Filled 2023-07-19: qty 2

## 2023-07-19 MED ORDER — AMOXICILLIN 500 MG PO CAPS: 500.0000 mg | ORAL_CAPSULE | Freq: Three times a day (TID) | ORAL | 0 refills | Status: DC

## 2023-07-19 MED ORDER — AMOXICILLIN 500 MG PO CAPS
500.0000 mg | ORAL_CAPSULE | Freq: Once | ORAL | Status: AC
  Administered 2023-07-19: 500 mg via ORAL
  Filled 2023-07-19: qty 1

## 2023-07-19 NOTE — ED Notes (Signed)
 Discharge paperwork given and verbally understood.

## 2023-07-19 NOTE — ED Provider Notes (Addendum)
 [place] [facility] DEPARTMENT AT [facility] Provider Note   CSN: [id number] Arrival date & time: 07/19/23  1011     History  Chief Complaint  Patient presents with   Neck Pain   HPI Linda Cabrera is a 68 y.o. female presenting for neck pain.  Started last night.  Located just below the right mid jawline.  States she noticed a small "lump" in that area.  It is slightly uncomfortable.  She noticed it last night while watching TV.  She denies any trouble eating or drinking.  Denies sore throat or any dental pain.  She is concerned because she has never noticed it there before.  Past Medical History:  Diagnosis Date   Anemia, unspecified 07/13/2015   Arthritis    Asthma    Dyspnea    Hernia, umbilical    Herpes    Hypertension    Pre-diabetes      Neck Pain      Home Medications Prior to Admission medications   Medication Sig Start Date End Date Taking? Authorizing Provider  amoxicillin (AMOXIL) 500 MG capsule Take 1 capsule (500 mg total) by mouth 3 (three) times daily. 07/19/23  Yes Gareth Eagle, PA-C  amLODipine (NORVASC) 10 MG tablet Take 10 mg by mouth daily. 08/14/21   [provider]  aspirin EC 81 MG tablet Take 1 tablet (81 mg total) by mouth daily. 07/11/19   Myrlene Broker, MD  aspirin-acetaminophen-caffeine (EXCEDRIN MIGRAINE) [phone] MG tablet Take 2 tablets by mouth daily as needed for headache.    [provider]  celecoxib (CELEBREX) 200 MG capsule Take 1 capsule (200 mg total) by mouth 2 (two) times daily as needed. 04/17/23   Kathryne Hitch, MD  chlorthalidone (HYGROTON) 25 MG tablet Take 1 tablet (25 mg total) by mouth daily. 07/11/21   Myrlene Broker, MD  cyclobenzaprine (FLEXERIL) 5 MG tablet 1-2 tablets up to 3 times daily as needed. Orally 12/25/22   [provider]  diazepam (VALIUM) 5 MG tablet Take one tablet by mouth with food one hour prior to procedure. May repeat 30 minutes prior if  needed. 06/30/23   Juanda Chance, NP  Fluticasone Propionate, Inhal, (FLOVENT DISKUS) 100 MCG/ACT AEPB 1 inhalation twice per day 02/04/22   Corwin Levins, MD  Fluticasone-Umeclidin-Vilant (TRELEGY ELLIPTA) 100-62.5-25 MCG/ACT AEPB Inhale 1 puff into the lungs daily. 04/18/21   Myrlene Broker, MD  ipratropium-albuterol (DUONEB) 0.5-2.5 (3) MG/3ML SOLN Take 3 mLs by nebulization every 6 (six) hours as needed (shortness of breath, wheezing). 02/26/21   [place], Alvira Philips, DO  magnesium hydroxide (DULCOLAX MILK OF MAGNESIA) 400 MG/5ML suspension Take 22.5 mLs by mouth daily as needed (constipation). Dulcolax Liquid    [provider]  metroNIDAZOLE (METROGEL) 0.75 % vaginal gel Place vaginally at bedtime. 03/02/23   [provider]  pantoprazole (PROTONIX) 20 MG tablet Take 20 mg by mouth daily. 02/14/23   [provider]  pantoprazole (PROTONIX) 40 MG tablet TAKE 1 TABLET BY MOUTH EVERY DAY 10/02/21   Myrlene Broker, MD  phentermine 37.5 MG capsule 1 capsule Orally Once a day 01/08/23   [provider]  Potassium Chloride ER 20 MEQ TBCR 1 tablet with food Orally Once a day for 30 days 06/24/22   [provider]  valACYclovir (VALTREX) 1000 MG tablet TAKE 1 TABLET BY MOUTH EVERY DAY 05/15/22   Myrlene Broker, MD      Allergies    Ace  inhibitors and Lisinopril    Review of Systems   Review of Systems  Musculoskeletal:  Positive for neck pain.    Physical Exam   Vitals:   07/19/23 1022 07/19/23 1030  BP: (!) 121/91 110/86  Pulse: 81 78  Resp: 16 16  Temp: 98.3 F (36.8 C)   SpO2: 100% 97%    CONSTITUTIONAL:  well-appearing, NAD NEURO:  Alert and oriented x 3, CN 3-12 grossly intact EYES:  eyes equal and reactive ENT/NECK:  Supple, no stridor, possible mild right submandibular lymphadenopathy.  Posterior oropharynx appears grossly normal, uvula is midline and nonedematous.  No peritonsillar abscess.  Generally poor  dentition. CARDIO: appears well-perfused  PULM:  No respiratory distress GI/GU:  non-distended MSK/SPINE:  No gross deformities, no edema, moves all extremities  SKIN:  no rash, atraumatic   *Additional and/or pertinent findings included in MDM below    ED Results / Procedures / Treatments   Labs (all labs ordered are listed, but only abnormal results are displayed) Labs Reviewed - No data to display  EKG None  Radiology No results found.  Procedures Procedures    Medications Ordered in ED Medications  amoxicillin (AMOXIL) capsule 500 mg (500 mg Oral Given 07/19/23 1037)  acetaminophen (TYLENOL) tablet 1,000 mg (1,000 mg Oral Given 07/19/23 1037)    ED Course/ Medical Decision Making/ A&P                                 Medical Decision Making Risk OTC drugs. Prescription drug management.   68 year old well-appearing female presenting for neck pain.  Exam notable for what was possibly very mild right sided submandibular lymphadenopathy and generally poor dentition.  Started on amoxicillin empirically for possible dental infection.  Discussed pertinent return precautions.  Advised to follow-up with her PCP.  Advised conservative treatment for associated pain at home.  Discharged in good condition.    Final Clinical Impression(s) / ED Diagnoses Final diagnoses:  Neck pain    Rx / DC Orders ED Discharge Orders          Ordered    amoxicillin (AMOXIL) 500 MG capsule  3 times daily        07/19/23 1041              Gareth Eagle, PA-C 07/19/23 1040    Gareth Eagle, PA-C 07/19/23 1042    Jacalyn Lefevre, MD 07/19/23 1534

## 2023-07-19 NOTE — ED Triage Notes (Signed)
 Pt reports painful lump in right upper neck below jawline since last night.  Denies any fever, illness or other symptoms.

## 2023-07-29 ENCOUNTER — Ambulatory Visit: Admitting: Orthopaedic Surgery

## 2023-07-29 DIAGNOSIS — M47819 Spondylosis without myelopathy or radiculopathy, site unspecified: Secondary | ICD-10-CM | POA: Diagnosis not present

## 2023-07-29 NOTE — Progress Notes (Signed)
 The patient is here for follow-up after having facet joint injections in her lumbar spine and nerve blocks by Dr. Daisey Dryer 2 weeks ago.  She says she is doing much better overall.  She is someone who is morbidly obese and we talked about weight loss.  She has lost some weight.  Weight loss is good help her back most as well as her knees.  She does have chronic knee pain which we have seen her for and chronic back pain but the back pain is significantly better with the injections.  On exam she does have a large soft tissue on both around both knees which would preclude her from any type of surgery until she loses weight.  We did talk in length in detail about weight loss.  She has a negative straight leg raise bilaterally with good strength in her legs.  Her pain in the lower back is decreased.  From my standpoint follow-up can be as needed.  I have a getting for weight loss is the most important thing for her and she understands this as well.

## 2023-08-05 ENCOUNTER — Telehealth: Payer: Self-pay | Admitting: *Deleted

## 2023-08-05 ENCOUNTER — Other Ambulatory Visit: Payer: Self-pay | Admitting: *Deleted

## 2023-08-05 DIAGNOSIS — Z1211 Encounter for screening for malignant neoplasm of colon: Secondary | ICD-10-CM

## 2023-08-05 NOTE — Telephone Encounter (Signed)
 Patient has been scheduled for colonoscopy at [facility] on Tuesday, 10/13/23 at 11:15 am, 9:45 am arrival. Please discuss with patient at her upcoming visit. [facility] colonoscopy for 09/10/23 cancelled.

## 2023-08-05 NOTE — Telephone Encounter (Signed)
 Ok for direct colonoscopy in hospital setting for screening with 2 day prep

## 2023-08-05 NOTE — Telephone Encounter (Signed)
 Team,  This pt's BMI is greater than 50; their procedure will need to be performed at the hospital.  Thanks,  Rogena Class

## 2023-08-05 NOTE — Telephone Encounter (Signed)
 Dr. Bridgett Camps, Please review previous message and advise if this patient will need an OV prior to procedure being scheduled at the hospital or if she can be a direct at the hospital for her procedure.  She is currently scheduled for a PV on 08/11/2023 in rm 52. Please/thank you Bre

## 2023-08-06 NOTE — Telephone Encounter (Signed)
 Notation made on patient's PV chart for this;

## 2023-08-11 ENCOUNTER — Ambulatory Visit (AMBULATORY_SURGERY_CENTER): Admitting: *Deleted

## 2023-08-11 ENCOUNTER — Encounter: Payer: Self-pay | Admitting: Internal Medicine

## 2023-08-11 VITALS — Ht 62.0 in | Wt 280.0 lb

## 2023-08-11 DIAGNOSIS — Z1211 Encounter for screening for malignant neoplasm of colon: Secondary | ICD-10-CM

## 2023-08-11 MED ORDER — NA SULFATE-K SULFATE-MG SULF 17.5-3.13-1.6 GM/177ML PO SOLN: 1.0000 | Freq: Once | ORAL | 0 refills | Status: AC

## 2023-08-11 NOTE — Progress Notes (Signed)
 Pt's name and DOB verified at the beginning of the pre-visit wit 2 identifiers  Pt denies any difficulty with ambulating,sitting, laying down or rolling side to side  Pt has no issues with ambulation   Pt has no issues moving head neck or swallowing  No egg or soy allergy known to patient   No issues known to pt with past sedation with any surgeries or procedures  Pt denies having issues being intubated  No FH of Malignant Hyperthermia  Pt is not on diet pills or shots  Pt is not on home 02   Pt is not on blood thinners   Pt denies issues with constipation   Pt is not on dialysis  Pt denise any abnormal heart rhythms   Pt denies any upcoming cardiac testing  Patient's chart reviewed by Rogena Class CNRA prior to pre-visit and patient appropriate for the [facility].  Pre-visit completed and red dot placed by patient's name on their procedure day (on provider's schedule).    Visit by phone  Pt states weight is 280 lb   IInstructions reviewed. Pt given , [facility] main # and MD on call # prior to instructions.  Pt states understanding of instructions. Instructed to review again prior to procedure. Pt states they will.

## 2023-08-18 ENCOUNTER — Ambulatory Visit
Admission: RE | Admit: 2023-08-18 | Discharge: 2023-08-18 | Disposition: A | Discharge status: Home / Self Care | Source: Ambulatory Visit | Attending: Family Medicine | Admitting: Family Medicine

## 2023-08-18 DIAGNOSIS — Z1231 Encounter for screening mammogram for malignant neoplasm of breast: Secondary | ICD-10-CM | POA: Diagnosis not present

## 2023-09-10 ENCOUNTER — Encounter: Admitting: Internal Medicine

## 2023-10-06 ENCOUNTER — Telehealth: Payer: Self-pay

## 2023-10-06 NOTE — Telephone Encounter (Signed)
 Procedure:COLON Procedure date: 10/13/23 Procedure location: [facility] Arrival Time: 9:45 Spoke with the patient Y/N Y Any prep concerns? N  Has the patient obtained the prep from the pharmacy ? Y Do you have a care partner and transportation: Y Any additional concerns? N

## 2023-10-07 ENCOUNTER — Encounter (HOSPITAL_COMMUNITY): Payer: Self-pay | Admitting: Internal Medicine

## 2023-10-13 ENCOUNTER — Encounter (HOSPITAL_COMMUNITY): Payer: Self-pay | Admitting: Internal Medicine

## 2023-10-13 ENCOUNTER — Ambulatory Visit (HOSPITAL_COMMUNITY): Admitting: Certified Registered"

## 2023-10-13 ENCOUNTER — Encounter (HOSPITAL_COMMUNITY)
Admission: RE | Disposition: A | Discharge status: Home / Self Care | Payer: Self-pay | Source: Home / Self Care | Attending: Internal Medicine

## 2023-10-13 ENCOUNTER — Other Ambulatory Visit: Payer: Self-pay

## 2023-10-13 ENCOUNTER — Ambulatory Visit (HOSPITAL_COMMUNITY)
Admission: RE | Admit: 2023-10-13 | Discharge: 2023-10-13 | Disposition: A | Discharge status: Home / Self Care | Attending: Internal Medicine | Admitting: Internal Medicine

## 2023-10-13 DIAGNOSIS — I10 Essential (primary) hypertension: Secondary | ICD-10-CM | POA: Insufficient documentation

## 2023-10-13 DIAGNOSIS — K6389 Other specified diseases of intestine: Secondary | ICD-10-CM | POA: Insufficient documentation

## 2023-10-13 DIAGNOSIS — Z1211 Encounter for screening for malignant neoplasm of colon: Secondary | ICD-10-CM

## 2023-10-13 DIAGNOSIS — D123 Benign neoplasm of transverse colon: Secondary | ICD-10-CM | POA: Insufficient documentation

## 2023-10-13 DIAGNOSIS — K5730 Diverticulosis of large intestine without perforation or abscess without bleeding: Secondary | ICD-10-CM | POA: Diagnosis not present

## 2023-10-13 DIAGNOSIS — Z87891 Personal history of nicotine dependence: Secondary | ICD-10-CM | POA: Insufficient documentation

## 2023-10-13 DIAGNOSIS — K219 Gastro-esophageal reflux disease without esophagitis: Secondary | ICD-10-CM | POA: Diagnosis not present

## 2023-10-13 DIAGNOSIS — Z6841 Body Mass Index (BMI) 40.0 and over, adult: Secondary | ICD-10-CM | POA: Insufficient documentation

## 2023-10-13 DIAGNOSIS — E6689 Other obesity not elsewhere classified: Secondary | ICD-10-CM | POA: Diagnosis not present

## 2023-10-13 HISTORY — PX: COLONOSCOPY: SHX5424

## 2023-10-13 SURGERY — COLONOSCOPY
Anesthesia: Monitor Anesthesia Care

## 2023-10-13 MED ORDER — PROPOFOL 500 MG/50ML IV EMUL
INTRAVENOUS | Status: DC | PRN
  Administered 2023-10-13: 135 ug/kg/min via INTRAVENOUS

## 2023-10-13 MED ORDER — PROPOFOL 1000 MG/100ML IV EMUL
INTRAVENOUS | Status: AC
  Filled 2023-10-13: qty 100

## 2023-10-13 MED ORDER — PROPOFOL 10 MG/ML IV BOLUS
INTRAVENOUS | Status: DC | PRN
  Administered 2023-10-13: 20 mg via INTRAVENOUS
  Administered 2023-10-13: 40 mg via INTRAVENOUS
  Administered 2023-10-13: 30 mg via INTRAVENOUS

## 2023-10-13 MED ORDER — SODIUM CHLORIDE 0.9 % IV SOLN: INTRAVENOUS | Status: DC

## 2023-10-13 NOTE — Anesthesia Preprocedure Evaluation (Addendum)
 Anesthesia Evaluation  Patient identified by MRN, date of birth, ID band Patient awake    Reviewed: Allergy & Precautions, NPO status , Patient's Chart, lab work & pertinent test results  Airway Mallampati: III  TM Distance: >3 FB Neck ROM: Full    Dental no notable dental hx. (+) Teeth Intact, Dental Advisory Given   Pulmonary asthma , former smoker   Pulmonary exam normal breath sounds clear to auscultation       Cardiovascular hypertension, Pt. on medications Normal cardiovascular exam Rhythm:Regular Rate:Normal  TTE 2017 - Definity  used; normal LV function; mild LAE; trace MR and TR.   Neuro/Psych negative neurological ROS  negative psych ROS   GI/Hepatic Neg liver ROS,GERD  ,,  Endo/Other    Class 4 obesity (BMI 50)  Renal/GU negative Renal ROS  negative genitourinary   Musculoskeletal  (+) Arthritis ,    Abdominal   Peds  Hematology negative hematology ROS (+)   Anesthesia Other Findings   Reproductive/Obstetrics                             Anesthesia Physical Anesthesia Plan  ASA: 3  Anesthesia Plan: MAC   Post-op Pain Management:    Induction: Intravenous  PONV Risk Score and Plan: Propofol  infusion and Treatment may vary due to age or medical condition  Airway Management Planned: Natural Airway  Additional Equipment:   Intra-op Plan:   Post-operative Plan:   Informed Consent: I have reviewed the patients History and Physical, chart, labs and discussed the procedure including the risks, benefits and alternatives for the proposed anesthesia with the patient or authorized representative who has indicated his/her understanding and acceptance.     Dental advisory given  Plan Discussed with: CRNA  Anesthesia Plan Comments:        Anesthesia Quick Evaluation

## 2023-10-13 NOTE — Anesthesia Postprocedure Evaluation (Signed)
 Anesthesia Post Note  Patient: Linda Cabrera  Procedure(s) Performed: COLONOSCOPY     Patient location during evaluation: Endoscopy Anesthesia Type: MAC Level of consciousness: awake and alert Pain management: pain level controlled Vital Signs Assessment: post-procedure vital signs reviewed and stable Respiratory status: spontaneous breathing, nonlabored ventilation, respiratory function stable and patient connected to nasal cannula oxygen  Cardiovascular status: blood pressure returned to baseline and stable Postop Assessment: no apparent nausea or vomiting Anesthetic complications: no  No notable events documented.  Last Vitals:  Vitals:   10/13/23 1150 10/13/23 1200  BP: (!) 140/61 116/68  Pulse: 63 62  Resp: (!) 21 15  Temp:    SpO2: 100% 100%    Last Pain:  Vitals:   10/13/23 1200  TempSrc:   PainSc: 0-No pain                 Duilio Heritage L Karion Cudd

## 2023-10-13 NOTE — Transfer of Care (Signed)
 Immediate Anesthesia Transfer of Care Note  Patient: Linda Cabrera  Procedure(s) Performed: COLONOSCOPY  Patient Location: PACU  Anesthesia Type:MAC  Level of Consciousness: drowsy  Airway & Oxygen  Therapy: Patient Spontanous Breathing and Patient connected to face mask oxygen   Post-op Assessment: Report given to RN, Post -op Vital signs reviewed and stable, and Patient moving all extremities X 4  Post vital signs: Reviewed and stable  Last Vitals:  Vitals Value Taken Time  BP 109/36   Temp    Pulse 72   Resp 12   SpO2 98     Last Pain:  Vitals:   10/13/23 1005  TempSrc: Temporal  PainSc: 0-No pain         Complications: No notable events documented.

## 2023-10-13 NOTE — H&P (Signed)
 GASTROENTEROLOGY PROCEDURE H&P NOTE   Primary Care Physician: Leonel Cole, MD    Reason for Procedure:  Colon cancer screening  Plan:    Colonoscopy   The nature of the procedure, as well as the risks, benefits, and alternatives were carefully and thoroughly reviewed with the patient. Ample time for discussion and questions allowed. The patient understood, was satisfied, and agreed to proceed.     HPI: Linda Cabrera is a 68 y.o. female who presents for colonoscopy.  Medical history as below.  Tolerated the prep.  No recent chest pain or shortness of breath.  No abdominal pain today.  Past Medical History:  Diagnosis Date   Anemia, unspecified 07/13/2015   Arthritis    Asthma    Dyspnea    Gallstones    GERD (gastroesophageal reflux disease)    Hernia, umbilical    Herpes    Hypertension    Osteoporosis    Pre-diabetes     Past Surgical History:  Procedure Laterality Date   APPENDECTOMY     BIOPSY  06/24/2022   Procedure: BIOPSY;  Surgeon: Albertus Gordy HERO, MD;  Location: [facility];  Service: Gastroenterology;;   SHERIDA SHIRLEAN DEL PYLORI N/A 02/13/2014   Procedure: SHERIDA SHIRLEAN DEL BRUCE;  Surgeon: Camellia HERO Blush, MD;  Location: [facility];  Service: General;  Laterality: N/A;   CHOLECYSTECTOMY N/A 04/29/2019   Procedure: LAPAROSCOPIC CHOLECYSTECTOMY;  Surgeon: Stevie Herlene Righter, MD;  Location: [facility];  Service: General;  Laterality: N/A;   COLONOSCOPY N/A 08/15/2013   Procedure: COLONOSCOPY;  Surgeon: Gordy HERO Albertus, MD;  Location: [facility];  Service: Gastroenterology;  Laterality: N/A;   DILATATION & CURETTAGE/HYSTEROSCOPY WITH MYOSURE N/A 01/07/2021   Procedure: DILATATION & CURETTAGE/HYSTEROSCOPY WITH MYOSURE;  Surgeon: Lavoie, Marie-Lyne, MD;  Location: [facility];  Service: Gynecology;  Laterality: N/A;   ESOPHAGEAL DILATION  06/24/2022   Procedure: ESOPHAGEAL DILATION;  Surgeon: Albertus Gordy HERO, MD;  Location: [facility];  Service: Gastroenterology;;    ESOPHAGOGASTRODUODENOSCOPY (EGD) WITH PROPOFOL  N/A 06/24/2022   Procedure: ESOPHAGOGASTRODUODENOSCOPY (EGD) WITH PROPOFOL ;  Surgeon: Albertus Gordy HERO, MD;  Location: [facility];  Service: Gastroenterology;  Laterality: N/A;   LAPAROSCOPIC APPENDECTOMY N/A 05/10/2022   Procedure: APPENDECTOMY LAPAROSCOPIC;  Surgeon: Sebastian Moles, MD;  Location: [facility];  Service: General;  Laterality: N/A;   TONSILLECTOMY     TUBAL LIGATION      Prior to Admission medications   Medication Sig Start Date End Date Taking? Authorizing Provider  amLODipine  (NORVASC ) 10 MG tablet Take 10 mg by mouth daily. 08/14/21  Yes [provider]  chlorthalidone  (HYGROTON ) 25 MG tablet Take 1 tablet (25 mg total) by mouth daily. 07/11/21  Yes Rollene Almarie DELENA, MD  Fluticasone-Umeclidin-Vilant (TRELEGY ELLIPTA ) 100-62.5-25 MCG/ACT AEPB Inhale 1 puff into the lungs daily. 04/18/21  Yes Rollene Almarie DELENA, MD  ipratropium-albuterol  (DUONEB) 0.5-2.5 (3) MG/3ML SOLN Take 3 mLs by nebulization every 6 (six) hours as needed (shortness of breath, wheezing). 02/26/21  Yes [place], Eric J, DO  pantoprazole  (PROTONIX ) 20 MG tablet Take 20 mg by mouth daily. 02/14/23  Yes [provider]  pantoprazole  (PROTONIX ) 40 MG tablet TAKE 1 TABLET BY MOUTH EVERY DAY 10/02/21  Yes Rollene Almarie DELENA, MD  Potassium Chloride  ER 20 MEQ TBCR 1 tablet with food Orally Once a day for 30 days 06/24/22  Yes [provider]  valACYclovir  (VALTREX ) 1000 MG tablet TAKE 1 TABLET BY MOUTH EVERY DAY 05/15/22  Yes Rollene Almarie DELENA, MD  aspirin   EC 81 MG tablet Take 1 tablet (81 mg total) by mouth daily. 07/11/19   Rollene Almarie LABOR, MD  aspirin -acetaminophen -caffeine (EXCEDRIN MIGRAINE) 250-250-65 MG tablet Take 2 tablets by mouth daily as needed for headache.    [provider]  celecoxib  (CELEBREX ) 200 MG capsule Take 1 capsule (200 mg total) by mouth 2 (two) times daily as needed. Patient not taking: Reported on  08/11/2023 04/17/23   Vernetta Lonni GRADE, MD  Fluticasone Propionate , Inhal, (FLOVENT  DISKUS) 100 MCG/ACT AEPB 1 inhalation twice per day 02/04/22   Norleen Lynwood ORN, MD  magnesium  hydroxide (DULCOLAX MILK OF MAGNESIA) 400 MG/5ML suspension Take 22.5 mLs by mouth daily as needed (constipation). Dulcolax Liquid    [provider]  metroNIDAZOLE  (METROGEL ) 0.75 % vaginal gel Place vaginally at bedtime. Patient not taking: Reported on 08/11/2023 03/02/23   [provider]  phentermine  37.5 MG capsule 1 capsule Orally Once a day 01/08/23   [provider]    Current Facility-Administered Medications  Medication Dose Route Frequency Provider Last Rate Last Admin   0.9 %  sodium chloride  infusion   Intravenous Continuous Chontel Warning, Gordy HERO, MD        Allergies as of 08/05/2023 - Review Complete 07/19/2023  Allergen Reaction Noted   Ace inhibitors Swelling 04/24/2011   Lisinopril Swelling 05/02/2022    Family History  Problem Relation Age of Onset   Arthritis Mother    Hypertension Mother    Pancreatic cancer Mother    Arthritis Father    Hypertension Father    Colon cancer Neg Hx    Colon polyps Neg Hx    Esophageal cancer Neg Hx    Stomach cancer Neg Hx    Rectal cancer Neg Hx    Breast cancer Neg Hx    BRCA 1/2 Neg Hx     Social History   Socioeconomic History   Marital status: Married    Spouse name: Not on file   Number of children: Not on file   Years of education: Not on file   Highest education level: Not on file  Occupational History   Not on file  Tobacco Use   Smoking status: Former    Current packs/day: 0.00    Types: Cigarettes    Quit date: 06/16/2004    Years since quitting: 19.3    Passive exposure: Never   Smokeless tobacco: Never  Vaping Use   Vaping status: Never Used  Substance and Sexual Activity   Alcohol use: Not Currently    Comment: once a month   Drug use: Not Currently    Comment: remote h/o crack cocaine   Sexual  activity: Not Currently    Partners: Male    Birth control/protection: Surgical, Post-menopausal    Comment: BTL, menarche 68yo, sexual debut over 17yo  Other Topics Concern   Not on file  Social History Narrative   Not on file   Social Drivers of Health   Financial Resource Strain: Not on file  Food Insecurity: Not on file  Transportation Needs: Not on file  Physical Activity: Not on file  Stress: Not on file  Social Connections: Not on file  Intimate Partner Violence: Not on file    Physical Exam: Vital signs in last 24 hours: @BP  (!) 127/92   Pulse 72   Temp 97.8 F (36.6 C) (Temporal)   Resp 13   Ht 5' 3 (1.6 m)   Wt 127 kg   LMP 09/11/2020 Comment: BTL, not sexually active  SpO2 96%   BMI 49.60 kg/m  GEN: NAD EYE: Sclerae anicteric ENT: MMM CV: Non-tachycardic Pulm: CTA b/l GI: Soft, NT/ND NEURO:  Alert & Oriented x 3   Gordy Starch, MD [facility]  10/13/2023 10:40 AM

## 2023-10-13 NOTE — Discharge Instructions (Signed)

## 2023-10-13 NOTE — Anesthesia Procedure Notes (Signed)
 Procedure Name: MAC Date/Time: 10/13/2023 11:07 AM  Performed by: Brandy Almarie BROCKS, CRNAPre-anesthesia Checklist: Patient identified, Emergency Drugs available, Suction available and Patient being monitored Oxygen  Delivery Method: Simple face mask

## 2023-10-13 NOTE — Op Note (Signed)
 [facility] Patient Name: Linda Cabrera Procedure Date: 10/13/2023 MRN: [id number] Attending MD: Gordy CHRISTELLA Starch , MD, [id number] Date of Birth: 1955-05-07 CSN: [id number] Age: 68 Admit Type: Outpatient Procedure:                Colonoscopy Indications:              Screening for colorectal malignant neoplasm, Last                            colonoscopy 10 years ago Providers:                Gordy CHRISTELLA. Starch, MD, Clotilda Schmitz, RN, Jasmine                            Petiford, Technician, Almarie Hopping, CRNA Referring MD:             Cheryle Frees Medicines:                Monitored Anesthesia Care Complications:            No immediate complications. Estimated Blood Loss:     Estimated blood loss: none. Procedure:                Pre-Anesthesia Assessment:                           - Prior to the procedure, a History and Physical                            was performed, and patient medications and                            allergies were reviewed. The patient's tolerance of                            previous anesthesia was also reviewed. The risks                            and benefits of the procedure and the sedation                            options and risks were discussed with the patient.                            All questions were answered, and informed consent                            was obtained. Prior Anticoagulants: The patient has                            taken no anticoagulant or antiplatelet agents. ASA                            Grade Assessment: III - A patient with severe  systemic disease. After reviewing the risks and                            benefits, the patient was deemed in satisfactory                            condition to undergo the procedure.                           After obtaining informed consent, the colonoscope                            was passed under direct vision. Throughout the                             procedure, the patient's blood pressure, pulse, and                            oxygen  saturations were monitored continuously. The                            CF-HQ190L ([id number]) Olympus colonoscope was                            introduced through the anus and advanced to the                            cecum, identified by appendiceal orifice and                            ileocecal valve. The colonoscopy was performed                            without difficulty. The patient tolerated the                            procedure well. The quality of the bowel                            preparation was good. The ileocecal valve,                            appendiceal orifice, and rectum were photographed. Scope In: 11:13:45 AM Scope Out: 11:30:20 AM Scope Withdrawal Time: 0 hours 9 minutes 39 seconds  Total Procedure Duration: 0 hours 16 minutes 35 seconds  Findings:      The digital rectal exam was normal.      A 3 mm polyp was found in the transverse colon. The polyp was sessile.       The polyp was removed with a cold snare. Resection and retrieval were       complete.      A diffuse area of severe melanosis was found in the entire colon.      A few small-mouthed diverticula were found in the sigmoid colon.      The retroflexed view of the distal  rectum and anal verge was normal and       showed no anal or rectal abnormalities. Impression:               - One 3 mm polyp in the transverse colon, removed                            with a cold snare. Resected and retrieved.                           - Melanosis in the colon.                           - Mild diverticulosis in the sigmoid colon.                           - The distal rectum and anal verge are normal on                            retroflexion view. Moderate Sedation:      Not Applicable - Patient had care per Anesthesia. Recommendation:           - Patient has a contact number available for                             emergencies. The signs and symptoms of potential                            delayed complications were discussed with the                            patient. Return to normal activities tomorrow.                            Written discharge instructions were provided to the                            patient.                           - Resume previous diet.                           - Continue present medications.                           - Await pathology results.                           - Repeat colonoscopy is recommended. The                            colonoscopy date will be determined after pathology                            results from today's exam become available for  review. Procedure Code(s):        --- Professional ---                           [phone], Colonoscopy, flexible; with removal of                            tumor(s), polyp(s), or other lesion(s) by snare                            technique Diagnosis Code(s):        --- Professional ---                           Z12.11, Encounter for screening for malignant                            neoplasm of colon                           D12.3, Benign neoplasm of transverse colon (hepatic                            flexure or splenic flexure)                           K63.89, Other specified diseases of intestine CPT copyright 2022 American Medical Association. All rights reserved. The codes documented in this report are preliminary and upon coder review may  be revised to meet current compliance requirements. Gordy CHRISTELLA Starch, MD 10/13/2023 11:43:51 AM This report has been signed electronically. Number of Addenda: 0

## 2023-10-14 LAB — SURGICAL PATHOLOGY

## 2023-10-15 ENCOUNTER — Ambulatory Visit: Payer: Self-pay | Admitting: Internal Medicine

## 2023-11-26 DIAGNOSIS — R0989 Other specified symptoms and signs involving the circulatory and respiratory systems: Secondary | ICD-10-CM | POA: Diagnosis not present

## 2023-11-26 DIAGNOSIS — I10 Essential (primary) hypertension: Secondary | ICD-10-CM | POA: Diagnosis not present

## 2023-11-26 DIAGNOSIS — R059 Cough, unspecified: Secondary | ICD-10-CM | POA: Diagnosis not present

## 2023-11-26 DIAGNOSIS — J45901 Unspecified asthma with (acute) exacerbation: Secondary | ICD-10-CM | POA: Diagnosis not present

## 2023-12-22 DIAGNOSIS — R143 Flatulence: Secondary | ICD-10-CM | POA: Diagnosis not present

## 2023-12-22 DIAGNOSIS — R059 Cough, unspecified: Secondary | ICD-10-CM | POA: Diagnosis not present

## 2023-12-22 DIAGNOSIS — I10 Essential (primary) hypertension: Secondary | ICD-10-CM | POA: Diagnosis not present

## 2023-12-22 DIAGNOSIS — J45909 Unspecified asthma, uncomplicated: Secondary | ICD-10-CM | POA: Diagnosis not present

## 2023-12-22 DIAGNOSIS — K469 Unspecified abdominal hernia without obstruction or gangrene: Secondary | ICD-10-CM | POA: Diagnosis not present

## 2023-12-22 DIAGNOSIS — R7303 Prediabetes: Secondary | ICD-10-CM | POA: Diagnosis not present

## 2023-12-22 DIAGNOSIS — K219 Gastro-esophageal reflux disease without esophagitis: Secondary | ICD-10-CM | POA: Diagnosis not present

## 2023-12-22 DIAGNOSIS — J45901 Unspecified asthma with (acute) exacerbation: Secondary | ICD-10-CM | POA: Diagnosis not present

## 2023-12-22 DIAGNOSIS — M545 Low back pain, unspecified: Secondary | ICD-10-CM | POA: Diagnosis not present

## 2024-01-14 DIAGNOSIS — K432 Incisional hernia without obstruction or gangrene: Secondary | ICD-10-CM | POA: Diagnosis not present

## 2024-01-20 ENCOUNTER — Ambulatory Visit
Admission: RE | Admit: 2024-01-20 | Discharge: 2024-01-20 | Disposition: A | Source: Ambulatory Visit | Attending: Family Medicine | Admitting: Family Medicine

## 2024-01-20 ENCOUNTER — Other Ambulatory Visit: Payer: Self-pay | Admitting: Family Medicine

## 2024-01-20 DIAGNOSIS — R059 Cough, unspecified: Secondary | ICD-10-CM

## 2024-02-15 ENCOUNTER — Encounter: Payer: Self-pay | Admitting: Radiology

## 2024-02-16 ENCOUNTER — Other Ambulatory Visit

## 2024-04-19 ENCOUNTER — Ambulatory Visit: Admitting: Internal Medicine

## 2024-04-19 ENCOUNTER — Encounter: Payer: Self-pay | Admitting: Internal Medicine

## 2024-04-19 VITALS — BP 132/64 | HR 72 | Temp 97.6°F | Ht 63.0 in | Wt 273.0 lb

## 2024-04-19 DIAGNOSIS — R058 Other specified cough: Secondary | ICD-10-CM

## 2024-04-19 DIAGNOSIS — Z6841 Body Mass Index (BMI) 40.0 and over, adult: Secondary | ICD-10-CM

## 2024-04-19 LAB — CBC WITH DIFFERENTIAL/PLATELET
Basophils Absolute: 0 K/uL (ref 0.0–0.1)
Basophils Relative: 0.4 % (ref 0.0–3.0)
Eosinophils Absolute: 0.1 K/uL (ref 0.0–0.7)
Eosinophils Relative: 1.9 % (ref 0.0–5.0)
HCT: 41.2 % (ref 36.0–46.0)
Hemoglobin: 13.4 g/dL (ref 12.0–15.0)
Lymphocytes Relative: 30.6 % (ref 12.0–46.0)
Lymphs Abs: 2.2 K/uL (ref 0.7–4.0)
MCHC: 32.6 g/dL (ref 30.0–36.0)
MCV: 84.5 fl (ref 78.0–100.0)
Monocytes Absolute: 0.4 K/uL (ref 0.1–1.0)
Monocytes Relative: 5.9 % (ref 3.0–12.0)
Neutro Abs: 4.4 K/uL (ref 1.4–7.7)
Neutrophils Relative %: 61.2 % (ref 43.0–77.0)
Platelets: 226 K/uL (ref 150.0–400.0)
RBC: 4.87 Mil/uL (ref 3.87–5.11)
RDW: 16.3 % — ABNORMAL HIGH (ref 11.5–15.5)
WBC: 7.3 K/uL (ref 4.0–10.5)

## 2024-04-19 MED ORDER — METHYLPREDNISOLONE ACETATE 80 MG/ML IJ SUSP
120.0000 mg | Freq: Once | INTRAMUSCULAR | Status: AC
  Administered 2024-04-19: 120 mg via INTRAMUSCULAR

## 2024-04-19 MED ORDER — PANTOPRAZOLE SODIUM 40 MG PO TBEC: DELAYED_RELEASE_TABLET | ORAL | 2 refills | Status: AC

## 2024-04-19 MED ORDER — TRAMADOL HCL 50 MG PO TABS: 50.0000 mg | ORAL_TABLET | ORAL | 1 refills | Status: AC | PRN

## 2024-04-19 MED ORDER — FAMOTIDINE 20 MG PO TABS: ORAL_TABLET | ORAL | 11 refills | Status: AC

## 2024-04-19 MED ORDER — BUDESONIDE 0.25 MG/2ML IN SUSP: RESPIRATORY_TRACT | 12 refills | Status: AC

## 2024-04-19 NOTE — Assessment & Plan Note (Addendum)
 Body mass index is 48.36 kg/m.   Lab Results  Component Value Date   TSH 2.38 04/03/2020      Contributing to doe and risk of GERD/dvt/ PE  >>>   reviewed the need and the process to achieve and maintain neg calorie balance > defer f/u primary care including intermittently monitoring thyroid  status

## 2024-04-19 NOTE — Progress Notes (Signed)
 "  Linda Cabrera, female    DOB: 1955-07-01   MRN: [id number]   Brief patient profile:  42 yobf  with asthma all her life seemed better while smoking but stopped 2006 and worse with wt gain  referred to pulmonary clinic 04/19/2024 by Dr Leonel  for cough > sob  x spring 2025 p trip to [place]   where exposed cold fan     Pt not previously seen by PCCM service/ h/o  ACEi cough   History of Present Illness  04/19/2024  Pulmonary/ 1st office eval/Linda Cabrera  on trelegy  Chief Complaint  Patient presents with   Consult    Ongoing productive cough x8 mths (yellow).   Dyspnea:  limited  R knee can't do any shopping - about 50 ft indoors then knee stops her Cough: at hs more so than daytime> min mucoid   Sleep: bed is flat with 2 pillows and coughs all night  - cough drops help but nothing else works SABA use: don't help  02 [id number]     No obvious day to day or daytime pattern/variability or assoc mucus plugs or hemoptysis or cp or chest tightness, subjective wheeze or overt sinus or hb symptoms.    Also denies any obvious fluctuation of symptoms with weather or environmental changes or other aggravating or alleviating factors except as outlined above   No unusual exposure hx or h/o childhood pna  or knowledge of premature birth.  Current Allergies, Complete Past Medical History, Past Surgical History, Family History, and Social History were reviewed in [employer] record.  ROS  The following are not active complaints unless bolded Hoarseness, sore throat, dysphagia, dental problems, itching, sneezing,  nasal congestion or discharge of excess mucus or purulent secretions, ear ache,   fever, chills, sweats, unintended wt loss or wt gain, classically pleuritic or exertional cp,  orthopnea pnd or arm/hand swelling  or leg swelling, presyncope, palpitations, abdominal pain, anorexia, nausea, vomiting, diarrhea  or change in bowel habits or change in bladder habits, change in stools or  change in urine, dysuria, hematuria,  rash, arthralgias, visual complaints, headache, numbness, weakness or ataxia or problems with walking or coordination,  change in mood or  memory.             Outpatient Medications Prior to Visit - - NOTE:   Unable to verify as accurately reflecting what pt takes    Medication Sig Dispense Refill   amLODipine  (NORVASC ) 10 MG tablet Take 10 mg by mouth daily.     aspirin  EC 81 MG tablet Take 1 tablet (81 mg total) by mouth daily. 90 tablet 11   aspirin -acetaminophen -caffeine (EXCEDRIN MIGRAINE) 250-250-65 MG tablet Take 2 tablets by mouth daily as needed for headache.     celecoxib  (CELEBREX ) 200 MG capsule Take 1 capsule (200 mg total) by mouth 2 (two) times daily as needed. 60 capsule 3   chlorthalidone  (HYGROTON ) 25 MG tablet Take 1 tablet (25 mg total) by mouth daily. 90 tablet 3   ipratropium-albuterol  (DUONEB) 0.5-2.5 (3) MG/3ML SOLN Take 3 mLs by nebulization every 6 (six) hours as needed (shortness of breath, wheezing). 360 mL 0   magnesium  hydroxide (DULCOLAX MILK OF MAGNESIA) 400 MG/5ML suspension Take 22.5 mLs by mouth daily as needed (constipation). Dulcolax Liquid     pantoprazole  (PROTONIX ) 40 MG tablet TAKE 1 TABLET BY MOUTH EVERY DAY 90 tablet 2   Potassium Chloride  ER 20 MEQ TBCR 1 tablet with food Orally Once a day  for 30 days     valACYclovir  (VALTREX ) 1000 MG tablet TAKE 1 TABLET BY MOUTH EVERY DAY 90 tablet 0   albuterol  (VENTOLIN  HFA) 108 (90 Base) MCG/ACT inhaler Inhale 2 puffs into the lungs every 6 (six) hours as needed.     Fluticasone Propionate , Inhal, (FLOVENT  DISKUS) 100 MCG/ACT AEPB 1 inhalation twice per day 1 each 2   Fluticasone-Umeclidin-Vilant (TRELEGY ELLIPTA ) 100-62.5-25 MCG/ACT AEPB Inhale 1 puff into the lungs daily. 1 each 11   pantoprazole  (PROTONIX ) 20 MG tablet Take 20 mg by mouth daily.     metroNIDAZOLE  (METROGEL ) 0.75 % vaginal gel Place vaginally at bedtime. (Patient not taking: Reported on 04/19/2024)      phentermine  37.5 MG capsule 1 capsule Orally Once a day (Patient not taking: Reported on 04/19/2024)     No facility-administered medications prior to visit.        Objective:     BP 132/64   Pulse 72   Temp 97.6 F (36.4 C)   Ht 5' 3 (1.6 m)   Wt 273 lb (123.8 kg)   LMP 09/11/2020 Comment: BTL, not sexually active  SpO2 98% Comment: room air  BMI 48.36 kg/m   SpO2: 98 % (room air) amb slt hoarse  MO (by BMI)  bf  with cough on early insp  harsh nonproductive    HEENT : Oropharynx  clear      Nasal turbinates nl    NECK :  without  apparent JVD/ palpable Nodes/TM    LUNGS: no acc muscle use,  Nl contour chest which is clear to A and P bilaterally without cough on insp or exp maneuvers   CV:  RRR  no s3 or murmur or increase in P2, and no edema   ABD:  obese soft and nontender   MS:  Gait nl   ext warm without deformities Or obvious joint restrictions  calf tenderness, cyanosis or clubbing    SKIN: warm and dry without lesions    NEURO:  alert, approp, nl sensorium with  no motor or cerebellar deficits apparent.       I personally reviewed images and agree with radiology impression as follows:  CXR:   pa and lateral  01/20/24 No active cardiopulmonary disease.   Assessment   Assessment & Plan Upper airway cough syndrome Onset spring 2025 in setting of lifelong asthma   only better while smoking  - Allergy screen 04/19/2024 >  Eos 0.1 /  IgE  527  - 04/19/2024 cyclical cough rx with tramadol / max gerd rx and depomedrol 120  The lack of response to rx directex at asthma strongly supports  :  Upper airway cough syndrome (previously labeled PNDS),  is so named because it's frequently impossible to sort out how much is  CR/sinusitis with freq throat clearing (which can be related to primary GERD)   vs  causing  secondary ( extra esophageal)  GERD from wide swings in gastric pressure that occur with throat clearing, often  promoting self use of mint and menthol  lozenges that reduce the lower esophageal sphincter tone and exacerbate the problem further in a cyclical fashion.   These are the same pts (now being labeled as having irritable larynx syndrome by some cough centers) who not infrequently have a history of having failed to tolerate ace inhibitors( as was the case here) ,  dry powder inhalers(really any inhaler can trigger this)  or biphosphonates or report having atypical/extraesophageal reflux symptoms from LPR (globus, throat  clearing)  that don't respond to standard doses of PPI  and are easily confused as having aecopd or asthma flares by even experienced allergists/ pulmonologists (myself included).   Of the three most common causes of  Sub-acute / recurrent or chronic cough, only one (GERD)  can actually contribute to/ trigger  the other two (asthma and post nasal drip syndrome)  and perpetuate the cylce of cough.  While not intuitively obvious, many patients with chronic low grade reflux do not cough until there is a primary insult that disturbs the protective epithelial barrier and exposes sensitive nerve endings.   This is typically viral but can due to PNDS and  either may apply here.  >>>     The point is that once this occurs, it is difficult to eliminate the cycle  using anything but a maximally effective acid suppression regimen at least in the short run, accompanied by an appropriate diet to address non acid GERD and control / eliminate the cough itself for at least 3 days with tramadol   and suppress the PNDS ( which couild be from allergic rhinitis given the high IgE ) with 1st gen H1 blockers per guidelines  and >>> also added  depomedrol 120 mg IM  in case of component of Th-2 driven upper or lower airways inflammation (if cough responds short term only to relapse before return while will on full rx for uacs (as above), then that would point to allergic rhinitis/ asthma or eos bronchitis as alternative dx)    >>> also change inhalers to  neb bud/alb  prn   for now to reduce possible irritation from sprays   If not improving quickly will refer to allergy next.  F/u in 4 weeks with all meds in hand using a trust but verify approach to confirm accurate Medication  Reconciliation The principal here is that until we are certain that the  patients are doing what we've asked, it makes no sense to ask them to do more.     Morbid obesity due to excess calories (HCC) Body mass index is 48.36 kg/m.   Lab Results  Component Value Date   TSH 2.38 04/03/2020   Contributing to doe and risk of GERD/dvt/ PE  >>>   reviewed the need and the process to achieve and maintain neg calorie balance > defer f/u primary care including intermittently monitoring thyroid  status              Each maintenance medication was reviewed in detail including emphasizing most importantly the difference between maintenance and prns and under what circumstances the prns are to be triggered using an action plan format where appropriate.  Total time for H and P, chart review, counseling, reviewing hfa/ neb  device(s) and generating customized AVS unique to this office visit / same day charting = 48 min        AVS  Patient Instructions  For cough/ congestion >   mucinex  dm    maximum of  1200 mg every 12 hours and supplement with tramadol   50 mg up to 1- 2  every 4 hours but the goal is no cough at all   For  throat tickle try take CHLORPHENIRAMINE  4 mg  (Allergy Relief 4mg   at [facility] should be easiest to find in the blue box usually on bottom shelf)  take one every 4 hours as needed - extremely effective and inexpensive over the counter- may cause drowsiness so start with just a dose  or two an hour before bedtime and see how you tolerate it before trying in daytime.   Stop all inhalers and take nebulizer duoneb twice daily with budesonide  0.25 mg with each duoneb    Continue Pantoprazole  (protonix ) 40 mg   Take  30-60 min before first meal  of the day and Pepcid  (famotidine )  20 mg after supper until return to office - this is the best way to tell whether stomach acid is contributing to your problem.     Depomedrol 120 mg IM   GERD (REFLUX)  is an extremely common cause of respiratory symptoms just like yours , many times with no obvious heartburn at all.    It can be treated with medication, but also with lifestyle changes including elevation of the head of your bed (ideally with 6 -8inch blocks under the headboard of your bed),  Smoking cessation, avoidance of late meals, excessive alcohol, and avoid fatty foods, chocolate, peppermint, colas, red wine, and acidic juices such as orange juice.  NO MINT OR MENTHOL PRODUCTS SO NO COUGH DROPS  USE SUGARLESS CANDY INSTEAD (Jolley ranchers or Stover's or Life Savers) or even ice chips will also do - the key is to swallow to prevent all throat clearing. NO OIL BASED VITAMINS - use powdered substitutes.  Avoid fish oil when coughing.   Please remember to go to the lab department   for your tests - we will call you with the results when they are available.   Please schedule a follow up office visit in 4 weeks, sooner if needed  with all medications /inhalers/ solutions in hand so we can verify exactly what you are taking. This includes all medications from all doctors and over the counters                   Ozell America, MD 04/19/2024        "

## 2024-04-19 NOTE — Patient Instructions (Addendum)
 For cough/ congestion >   mucinex  dm    maximum of  1200 mg every 12 hours and supplement with tramadol   50 mg up to 1- 2  every 4 hours but the goal is no cough at all   For  throat tickle try take CHLORPHENIRAMINE  4 mg  (Allergy Relief 4mg   at [facility] should be easiest to find in the blue box usually on bottom shelf)  take one every 4 hours as needed - extremely effective and inexpensive over the counter- may cause drowsiness so start with just a dose or two an hour before bedtime and see how you tolerate it before trying in daytime.   Stop all inhalers and take nebulizer duoneb twice daily with budesonide  0.25 mg with each duoneb    Continue Pantoprazole  (protonix ) 40 mg   Take  30-60 min before first meal of the day and Pepcid  (famotidine )  20 mg after supper until return to office - this is the best way to tell whether stomach acid is contributing to your problem.     Depomedrol 120 mg IM   GERD (REFLUX)  is an extremely common cause of respiratory symptoms just like yours , many times with no obvious heartburn at all.    It can be treated with medication, but also with lifestyle changes including elevation of the head of your bed (ideally with 6 -8inch blocks under the headboard of your bed),  Smoking cessation, avoidance of late meals, excessive alcohol, and avoid fatty foods, chocolate, peppermint, colas, red wine, and acidic juices such as orange juice.  NO MINT OR MENTHOL PRODUCTS SO NO COUGH DROPS  USE SUGARLESS CANDY INSTEAD (Jolley ranchers or Stover's or Life Savers) or even ice chips will also do - the key is to swallow to prevent all throat clearing. NO OIL BASED VITAMINS - use powdered substitutes.  Avoid fish oil when coughing.   Please remember to go to the lab department   for your tests - we will call you with the results when they are available.   Please schedule a follow up office visit in 4 weeks, sooner if needed  with all medications /inhalers/ solutions  in hand so we can verify exactly what you are taking. This includes all medications from all doctors and over the counters

## 2024-04-19 NOTE — Assessment & Plan Note (Addendum)
 Onset spring 2025 in setting of lifelong asthma   only better while smoking  - Allergy screen 04/19/2024 >  Eos 0.1 /  IgE  527  - 04/19/2024 cyclical cough rx with tramadol / max gerd rx and depomedrol 120  The lack of response to rx directex at asthma strongly supports  :  Upper airway cough syndrome (previously labeled PNDS),  is so named because it's frequently impossible to sort out how much is  CR/sinusitis with freq throat clearing (which can be related to primary GERD)   vs  causing  secondary ( extra esophageal)  GERD from wide swings in gastric pressure that occur with throat clearing, often  promoting self use of mint and menthol lozenges that reduce the lower esophageal sphincter tone and exacerbate the problem further in a cyclical fashion.   These are the same pts (now being labeled as having irritable larynx syndrome by some cough centers) who not infrequently have a history of having failed to tolerate ace inhibitors( as was the case here) ,  dry powder inhalers(really any inhaler can trigger this)  or biphosphonates or report having atypical/extraesophageal reflux symptoms from LPR (globus, throat clearing)  that don't respond to standard doses of PPI  and are easily confused as having aecopd or asthma flares by even experienced allergists/ pulmonologists (myself included).   Of the three most common causes of  Sub-acute / recurrent or chronic cough, only one (GERD)  can actually contribute to/ trigger  the other two (asthma and post nasal drip syndrome)  and perpetuate the cylce of cough.  While not intuitively obvious, many patients with chronic low grade reflux do not cough until there is a primary insult that disturbs the protective epithelial barrier and exposes sensitive nerve endings.   This is typically viral but can due to PNDS and  either may apply here.  >>>     The point is that once this occurs, it is difficult to eliminate the cycle  using anything but a maximally  effective acid suppression regimen at least in the short run, accompanied by an appropriate diet to address non acid GERD and control / eliminate the cough itself for at least 3 days with tramadol   and suppress the PNDS ( which couild be from allergic rhinitis given the high IgE ) with 1st gen H1 blockers per guidelines  and >>> also added  depomedrol 120 mg IM  in case of component of Th-2 driven upper or lower airways inflammation (if cough responds short term only to relapse before return while will on full rx for uacs (as above), then that would point to allergic rhinitis/ asthma or eos bronchitis as alternative dx)    >>> also change inhalers to neb bud/alb  prn   for now to reduce possible irritation from sprays   If not improving quickly will refer to allergy next.  F/u in 4 weeks with all meds in hand using a trust but verify approach to confirm accurate Medication  Reconciliation The principal here is that until we are certain that the  patients are doing what we've asked, it makes no sense to ask them to do more.

## 2024-04-22 LAB — IGE: IgE (Immunoglobulin E), Serum: 527 kU/L — ABNORMAL HIGH

## 2024-04-23 ENCOUNTER — Ambulatory Visit: Payer: Self-pay | Admitting: Internal Medicine

## 2024-04-25 ENCOUNTER — Other Ambulatory Visit: Payer: Self-pay

## 2024-04-25 DIAGNOSIS — R058 Other specified cough: Secondary | ICD-10-CM

## 2024-04-25 NOTE — Telephone Encounter (Signed)
 Called and relayed results to pt and she confirmed understanding - placed order for allergy

## 2024-06-07 ENCOUNTER — Ambulatory Visit: Admitting: Allergy

## 2024-06-23 ENCOUNTER — Ambulatory Visit: Admitting: Internal Medicine
